# Patient Record
Sex: Male | Born: 1935 | Race: White | Hispanic: No | Marital: Single | Smoking: Never smoker
Health system: Southern US, Community
[De-identification: ages and names within clinical notes are randomized; demographics above are authoritative.]

## PROBLEM LIST (undated history)

## (undated) ENCOUNTER — Emergency Department (HOSPITAL_COMMUNITY): Admission: EM | Payer: Self-pay | Source: Home / Self Care

## (undated) DIAGNOSIS — I10 Essential (primary) hypertension: Secondary | ICD-10-CM

## (undated) DIAGNOSIS — G8929 Other chronic pain: Secondary | ICD-10-CM

## (undated) DIAGNOSIS — J45909 Unspecified asthma, uncomplicated: Secondary | ICD-10-CM

## (undated) DIAGNOSIS — I209 Angina pectoris, unspecified: Secondary | ICD-10-CM

## (undated) DIAGNOSIS — M545 Low back pain, unspecified: Secondary | ICD-10-CM

## (undated) DIAGNOSIS — I509 Heart failure, unspecified: Secondary | ICD-10-CM

## (undated) DIAGNOSIS — Z9981 Dependence on supplemental oxygen: Secondary | ICD-10-CM

## (undated) DIAGNOSIS — H349 Unspecified retinal vascular occlusion: Secondary | ICD-10-CM

## (undated) DIAGNOSIS — D649 Anemia, unspecified: Secondary | ICD-10-CM

## (undated) DIAGNOSIS — C61 Malignant neoplasm of prostate: Secondary | ICD-10-CM

## (undated) DIAGNOSIS — J42 Unspecified chronic bronchitis: Secondary | ICD-10-CM

## (undated) DIAGNOSIS — I219 Acute myocardial infarction, unspecified: Secondary | ICD-10-CM

## (undated) DIAGNOSIS — R51 Headache: Secondary | ICD-10-CM

## (undated) DIAGNOSIS — M1990 Unspecified osteoarthritis, unspecified site: Secondary | ICD-10-CM

## (undated) DIAGNOSIS — E7800 Pure hypercholesterolemia, unspecified: Secondary | ICD-10-CM

## (undated) DIAGNOSIS — K219 Gastro-esophageal reflux disease without esophagitis: Secondary | ICD-10-CM

## (undated) DIAGNOSIS — I639 Cerebral infarction, unspecified: Secondary | ICD-10-CM

## (undated) DIAGNOSIS — I2510 Atherosclerotic heart disease of native coronary artery without angina pectoris: Secondary | ICD-10-CM

## (undated) DIAGNOSIS — E119 Type 2 diabetes mellitus without complications: Secondary | ICD-10-CM

## (undated) DIAGNOSIS — J189 Pneumonia, unspecified organism: Secondary | ICD-10-CM

## (undated) DIAGNOSIS — Z8719 Personal history of other diseases of the digestive system: Secondary | ICD-10-CM

## (undated) DIAGNOSIS — R3915 Urgency of urination: Secondary | ICD-10-CM

## (undated) DIAGNOSIS — J449 Chronic obstructive pulmonary disease, unspecified: Secondary | ICD-10-CM

## (undated) DIAGNOSIS — Z8711 Personal history of peptic ulcer disease: Secondary | ICD-10-CM

## (undated) DIAGNOSIS — R011 Cardiac murmur, unspecified: Secondary | ICD-10-CM

## (undated) DIAGNOSIS — C4491 Basal cell carcinoma of skin, unspecified: Secondary | ICD-10-CM

## (undated) DIAGNOSIS — R0602 Shortness of breath: Secondary | ICD-10-CM

## (undated) DIAGNOSIS — I350 Nonrheumatic aortic (valve) stenosis: Secondary | ICD-10-CM

## (undated) DIAGNOSIS — K759 Inflammatory liver disease, unspecified: Secondary | ICD-10-CM

## (undated) HISTORY — PX: CARDIAC CATHETERIZATION: SHX172

## (undated) HISTORY — PX: CORONARY ANGIOPLASTY WITH STENT PLACEMENT: SHX49

## (undated) HISTORY — PX: INGUINAL HERNIA REPAIR: SUR1180

## (undated) HISTORY — PX: ANTERIOR FUSION CERVICAL SPINE: SUR626

## (undated) HISTORY — PX: SP PTA PERIPHERAL: HXRAD401

## (undated) HISTORY — PX: BACK SURGERY: SHX140

## (undated) HISTORY — PX: CORONARY STENT PLACEMENT: SHX1402

## (undated) HISTORY — PX: SHOULDER ARTHROSCOPY W/ ROTATOR CUFF REPAIR: SHX2400

---

## 2005-08-15 ENCOUNTER — Inpatient Hospital Stay (HOSPITAL_COMMUNITY): Admission: EM | Admit: 2005-08-15 | Discharge: 2005-08-18 | Payer: Self-pay | Admitting: Emergency Medicine

## 2005-08-15 ENCOUNTER — Ambulatory Visit: Payer: Self-pay | Admitting: Physical Medicine & Rehabilitation

## 2005-08-16 ENCOUNTER — Ambulatory Visit: Payer: Self-pay | Admitting: Internal Medicine

## 2005-08-16 ENCOUNTER — Encounter (INDEPENDENT_AMBULATORY_CARE_PROVIDER_SITE_OTHER): Payer: Self-pay | Admitting: *Deleted

## 2005-08-18 ENCOUNTER — Encounter: Payer: Self-pay | Admitting: Internal Medicine

## 2005-08-18 ENCOUNTER — Encounter (INDEPENDENT_AMBULATORY_CARE_PROVIDER_SITE_OTHER): Payer: Self-pay | Admitting: *Deleted

## 2005-08-18 ENCOUNTER — Inpatient Hospital Stay (HOSPITAL_COMMUNITY)
Admission: RE | Admit: 2005-08-18 | Discharge: 2005-08-28 | Payer: Self-pay | Admitting: Physical Medicine & Rehabilitation

## 2005-08-18 ENCOUNTER — Ambulatory Visit: Payer: Self-pay | Admitting: Internal Medicine

## 2007-11-02 ENCOUNTER — Encounter: Payer: Self-pay | Admitting: Emergency Medicine

## 2007-11-02 ENCOUNTER — Inpatient Hospital Stay (HOSPITAL_COMMUNITY): Admission: AD | Admit: 2007-11-02 | Discharge: 2007-11-03 | Payer: Self-pay | Admitting: Cardiology

## 2007-11-02 ENCOUNTER — Ambulatory Visit: Payer: Self-pay | Admitting: Cardiology

## 2008-08-30 DIAGNOSIS — H349 Unspecified retinal vascular occlusion: Secondary | ICD-10-CM

## 2008-08-30 HISTORY — DX: Unspecified retinal vascular occlusion: H34.9

## 2009-08-10 ENCOUNTER — Inpatient Hospital Stay (HOSPITAL_COMMUNITY): Admission: EM | Admit: 2009-08-10 | Discharge: 2009-08-10 | Payer: Self-pay | Admitting: Emergency Medicine

## 2010-04-27 ENCOUNTER — Observation Stay (HOSPITAL_COMMUNITY): Admission: EM | Admit: 2010-04-27 | Discharge: 2010-04-28 | Payer: Self-pay | Admitting: Emergency Medicine

## 2010-09-20 ENCOUNTER — Inpatient Hospital Stay (HOSPITAL_COMMUNITY)
Admission: EM | Admit: 2010-09-20 | Discharge: 2010-09-24 | Payer: Self-pay | Source: Home / Self Care | Attending: Internal Medicine | Admitting: Internal Medicine

## 2010-09-22 LAB — VITAMIN B12: Vitamin B-12: 295 pg/mL (ref 211–911)

## 2010-09-22 LAB — COMPREHENSIVE METABOLIC PANEL
AST: 11 U/L (ref 0–37)
BUN: 20 mg/dL (ref 6–23)
CO2: 25 mEq/L (ref 19–32)
Chloride: 96 mEq/L (ref 96–112)
Creatinine, Ser: 0.96 mg/dL (ref 0.4–1.5)
GFR calc Af Amer: >60 mL/min (ref >60)
GFR calc non Af Amer: >60 mL/min (ref >60)
Glucose, Bld: 164 mg/dL — ABNORMAL HIGH (ref 70–99)
Total Bilirubin: 0.5 mg/dL (ref 0.3–1.2)

## 2010-09-22 LAB — FOLATE: Folate: >20 ng/mL

## 2010-09-22 LAB — IRON AND TIBC
Iron: 12 ug/dL — ABNORMAL LOW (ref 42–135)
Saturation Ratios: 5 % — ABNORMAL LOW (ref 20–55)
TIBC: 259 ug/dL (ref 215–435)
UIBC: 247 ug/dL

## 2010-09-22 LAB — CBC
HCT: 26.4 % — ABNORMAL LOW (ref 39.0–52.0)
Hemoglobin: 9.7 g/dL — ABNORMAL LOW (ref 13.0–17.0)
Hemoglobin: 9.2 g/dL — ABNORMAL LOW (ref 13.0–17.0)
MCH: 31 pg (ref 26.0–34.0)
MCV: 88.9 fL (ref 78.0–100.0)
RBC: 3.16 MIL/uL — ABNORMAL LOW (ref 4.22–5.81)
RBC: 2.97 MIL/uL — ABNORMAL LOW (ref 4.22–5.81)

## 2010-09-22 LAB — DIFFERENTIAL
Basophils Absolute: 0 10*3/uL (ref 0.0–0.1)
Basophils Relative: 0 % (ref 0–1)
Monocytes Relative: 10 % (ref 3–12)
Neutro Abs: 10.6 10*3/uL — ABNORMAL HIGH (ref 1.7–7.7)
Neutrophils Relative %: 85 % — ABNORMAL HIGH (ref 43–77)

## 2010-09-22 LAB — BASIC METABOLIC PANEL
Calcium: 9.7 mg/dL (ref 8.4–10.5)
Creatinine, Ser: 1.07 mg/dL (ref 0.4–1.5)
GFR calc Af Amer: >60 mL/min (ref >60)

## 2010-09-22 LAB — CK TOTAL AND CKMB (NOT AT ARMC): CK, MB: 1.6 ng/mL (ref 0.3–4.0)

## 2010-09-22 LAB — POCT CARDIAC MARKERS
CKMB, poc: <1 ng/mL — ABNORMAL LOW (ref 1.0–8.0)
Myoglobin, poc: 51.1 ng/mL (ref 12–200)
Troponin i, poc: <0.05 ng/mL (ref 0.00–0.09)

## 2010-09-22 LAB — TSH: TSH: 0.204 u[IU]/mL — ABNORMAL LOW (ref 0.350–4.500)

## 2010-09-22 LAB — URINALYSIS, ROUTINE W REFLEX MICROSCOPIC
Ketones, ur: NEGATIVE mg/dL
Nitrite: NEGATIVE
Protein, ur: NEGATIVE mg/dL

## 2010-09-22 LAB — CARDIAC PANEL(CRET KIN+CKTOT+MB+TROPI): CK, MB: 2.3 ng/mL (ref 0.3–4.0)

## 2010-09-22 LAB — GLUCOSE, CAPILLARY: Glucose-Capillary: 248 mg/dL — ABNORMAL HIGH (ref 70–99)

## 2010-09-22 LAB — CORTISOL: Cortisol, Plasma: 36.8 ug/dL

## 2010-09-22 LAB — BRAIN NATRIURETIC PEPTIDE: Pro B Natriuretic peptide (BNP): 137 pg/mL — ABNORMAL HIGH (ref 0.0–100.0)

## 2010-09-23 LAB — CBC
Hemoglobin: 9.9 g/dL — ABNORMAL LOW (ref 13.0–17.0)
Hemoglobin: 9.8 g/dL — ABNORMAL LOW (ref 13.0–17.0)
MCH: 30.7 pg (ref 26.0–34.0)
MCH: 31.1 pg (ref 26.0–34.0)
MCV: 89.8 fL (ref 78.0–100.0)
Platelets: 217 10*3/uL (ref 150–400)
RBC: 3.22 MIL/uL — ABNORMAL LOW (ref 4.22–5.81)
RBC: 3.15 MIL/uL — ABNORMAL LOW (ref 4.22–5.81)

## 2010-09-23 LAB — CARDIAC PANEL(CRET KIN+CKTOT+MB+TROPI)
Relative Index: INVALID (ref 0.0–2.5)
Total CK: 43 U/L (ref 7–232)
Total CK: 28 U/L (ref 7–232)
Troponin I: 0.21 ng/mL — ABNORMAL HIGH (ref 0.00–0.06)

## 2010-09-23 LAB — BASIC METABOLIC PANEL
BUN: 15 mg/dL (ref 6–23)
BUN: 14 mg/dL (ref 6–23)
CO2: 26 mEq/L (ref 19–32)
Chloride: 98 mEq/L (ref 96–112)
Chloride: 98 mEq/L (ref 96–112)
Creatinine, Ser: 0.86 mg/dL (ref 0.4–1.5)
GFR calc Af Amer: >60 mL/min (ref >60)
Glucose, Bld: 123 mg/dL — ABNORMAL HIGH (ref 70–99)
Potassium: 3.9 mEq/L (ref 3.5–5.1)

## 2010-09-23 LAB — TSH: TSH: 0.832 u[IU]/mL (ref 0.350–4.500)

## 2010-09-23 LAB — GLUCOSE, CAPILLARY
Glucose-Capillary: 125 mg/dL — ABNORMAL HIGH (ref 70–99)
Glucose-Capillary: 284 mg/dL — ABNORMAL HIGH (ref 70–99)
Glucose-Capillary: 161 mg/dL — ABNORMAL HIGH (ref 70–99)
Glucose-Capillary: 262 mg/dL — ABNORMAL HIGH (ref 70–99)

## 2010-09-23 LAB — D-DIMER, QUANTITATIVE: D-Dimer, Quant: 0.68 ug/mL-FEU — ABNORMAL HIGH (ref 0.00–0.48)

## 2010-09-23 LAB — ACTH STIMULATION, 3 TIME POINTS: Cortisol, Base: 32.2 ug/dL

## 2010-09-24 LAB — GLUCOSE, CAPILLARY: Glucose-Capillary: 167 mg/dL — ABNORMAL HIGH (ref 70–99)

## 2010-09-24 NOTE — Consult Note (Addendum)
NAME:  Eugene Bauer, PERRELLI NO.:  [id number]  MEDICAL RECORD NO.:  [id number]          PATIENT TYPE:  INP  LOCATION:  3729                         FACILITY:  [facility]  PHYSICIAN:  Colleen Can. Deborah Chalk, M.D.DATE OF BIRTH:  1927/04/10  DATE OF CONSULTATION:  09/22/2010 DATE OF DISCHARGE:                                CONSULTATION   PRIMARY CARDIOLOGIST:  Located at the [facility].  The patient is unsure who is he.  PRIMARY MEDICAL DOCTOR:  Dr. Lorina Rabon, [facility].  CHIEF COMPLAINT:  Chest pain, shortness of breath, and near passing out.  HISTORY OF PRESENT ILLNESS:  Eugene Bauer as an 77 year old gentleman with a history of coronary artery disease as outlined below, CVA, hypertension, hyperlipidemia and COPD, who presented with an episode of near syncope.  On the morning of admission, he went to breakfast, but did not feel quite well.  He cannot describe what was wrong, just felt overall rough.  He sat down at the dining table and shortly into his meal, developed black vision.  He had no loss of consciousness.  He attempted to stand up and his visual problems persisted.  He denies any chest pain, shortness of breath, palpitations, nausea, or any other symptoms during this time.  He states that he was not necessarily responding to somewhat talking behind him, but things just may be because he is hard of hearing.  He tried to sit back in a chair, but could not quite coordinate himself, so had to be helped down into the seat.  EMS was called.  He has no prior history of syncope.  Of note, he also relays a history of typical consistent chest pain and shortness of breath whenever he walks to the dining hall.  It is approximately 300 feet from his resident.  He usually takes about 3 or 4 times, this is done in rest.  Between his walk period, the chest pain goes away approximately 3 minutes after resting.  Here in the hospital, he had been treated for pneumonia with the prednisone  and Avelox.  First set of cardiac enzymes were negative x2, but following set showed a troponin of 0.07 with negative CK and MB and subsequent to that with a troponin of 0.25 with an MB of 4.7 with negative CK.  He has also had some nausea and vomiting apparently that he is having other workup for.  PAST MEDICAL HISTORY: 1. Coronary artery disease.     a.     The patient states he had an MI x2, once in the 1960s and      then again in May 2011.  No cath reports are available from his MI      in May 2011.  We discussed these results with both the patient and      Pam, his daughter who is a Engineer, civil (consulting).  Apparently, the patient had 75-      80% blockage in the back of this heart, which the doctors tried to      stent, but it kept on clamping down and they tried for an hour and  half without results.  They tried a different area to angioplasty,      but due to the anatomy, could get around to the shape.  He was      started on Ranexa as an outpatient, which did help and he has now      had to return to the cardiology stent. 2. EF of 55-60% with severe aortic valve thickening, but only mild AS     with an AVA of 2.02 cm and gradient 13 mmHg.  He has mild MR. 3. CVA x2 in December 2006 and December 2010. 4. Hyperlipidemia. 5. Hypertension. 6. Diabetes. 7. GERD. 8. COPD. 9. 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 toxaphene for     __________. 10.Prostate cancer status post radiation. 11.Glaucoma. 12.PVD status post PTCA of the legs in 2007. 13.Anemia with a hemoglobin ranging 9.9 in August 2011, 12.0 in     December 2010. Hemoglobin was 9.9.  Fecal occult blood testing was     negative this admission. 14.Status post cholecystectomy. 15  Status post C-spine surgery. 1. Status post shoulder surgery.  INPATIENT MEDICATIONS: 1. Aspirin 81 mg daily. 2. Aggrenox 1 cap b.i.d. 3. Diltiazem 120 mg daily. 4. Lovenox  40 mg subcu nightly. 5. Ferrous sulfate 325 mg b.i.d. 6. NovoLog sliding scale insulin. 7. Lisinopril 5 mg nightly. 8. Lopressor 25 mg b.i.d. 9. Avelox 400 mg q.24 h. 10.Protonix 40 mg daily. 11.Nitroglycerin 0.6 mg per hour transdermally daily. 12.Prednisone 40 mg daily. 13.Ranexa 500 mg t.i.d. 14.Crestor 5 mg nightly. 15.Salmeterol 1 puff b.i.d. 16.Travatan eye drops nightly.  ALLERGIES:  IV DYE and CODEINE.  FAMILY HISTORY:  His mother had CVA.  His father had an MI.  His siblings had diabetes and heart attack.  SOCIAL HISTORY:  He lives at [place].  He is divorced and has 4 daughters.  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  He quit smoking remotely and only smoked for a short period of time and denies any alcohol use.  REVIEW OF SYSTEMS:  No fevers, chills, orthopnea, palpitations, nausea, vomiting, bright red blood per rectum, melena or hematemesis. Approximately 3 months ago, he did have melena, but this has since resolved.  Positive for occasional sock line edema.  See HPI for other pertinent positives.  All other systems were reviewed and otherwise negative.  LABORATORY DATA:  WBC 13.3, hemoglobin 9.9, hematocrit 28.9, platelet count 217.  Sodium 132, potassium 3.9, chloride 98, CO2 26, glucose 123, BUN 15, creatinine 0.16.  BNP 137.  TSH 0.204.  RADIOLOGY: 1. Chest x-ray showed worsening bilateral lower lobe infiltrates     consistent with pneumonia. 2. Abdominal ultrasound showed 3 benign kidney cysts. 3. Abdominal plain film was benign.  PHYSICAL EXAMINATION:  VITAL SIGNS:  Temperature 98.3, pulse 70, respirations 18, blood pressure 107/66 with negative orthostatics, pulse ox 93% on 1 L. GENERAL:  This is a pleasant white male, elderly, who is in no acute distress. HEENT:  Normocephalic, atraumatic with extraocular movements intact and clear sclerae.  Nares are without discharge. NECK:   Supple without carotid bruit. HEART:  Auscultation of the hear reveals regular rate and rhythm with S1 and S2 with a 2/6 systolic ejection murmur.  He has decreased breath sounds throughout without wheezes, rales or rhonchi. ABDOMEN:  Soft, nontender, nondistended with positive  bowel sounds. EXTREMITIES:  Warm, dry with trace sock line edema.  No clubbing or cyanosis.  NEUROLOGIC:  He is alert and oriented x3.  Responds to questions appropriately with a normal affect.  He is hard of hearing.  ASSESSMENT/PLAN:  The patient was seen and examined by Dr. Deborah Chalk and myself.  This is a complicated 76 year old gentleman with a prior medical history that includes cerebrovascular accident, hypertension, hyperlipidemia, diabetes, chronic obstructive pulmonary disease, and coronary artery disease.  His last catheterization was in May 2011 and then from it sounds that he had coronary artery disease, lesions that were attempted to be fixed, so we are unable to, and therefore he has been continued on medical therapy.  Dr. Deborah Chalk does not feel that the patient needs to be started on heparin at this time given his lack of chest pain at this time as well as anemia.  We would recommend no cardiac catheterization at this time and it may be more appropriate to refer the patient back to the [place] to evaluate his progress giving that they have more access to records indicating a history of coronary anatomy.  We will attempt to obtain those records, but it is often difficult from the [place].  We would continue with his current management including his medical therapy of his coronary disease in terms of aspirin, metoprolol, nitroglycerin, and Crestor.  His syncope is probably multifactorial in regards to many factors including his anemia, deconditioning, and cerebrovascular disease.  He has had no arrhythmias on telemetry thus far, only PVCs.  Continue to monitor.  He may need rehab just for improved general  strengthening.  We will also recommend to continue evaluation of his anemia.  We will continue to follow with you.  Thank you for the opportunity to participate in the care of this patient.     Dayna Dunn, P.A.C.   ______________________________ Colleen Can. Deborah Chalk, M.D.    DD/MEDQ  D:  09/22/2010  T:  09/23/2010  Job:  [id number]  cc:   [facility]  Electronically Signed by Roger Shelter M.D. on 09/24/2010 03:40:10 PM Electronically Signed by Ronie Spies  on 09/30/2010 09:30:13 PM

## 2010-09-29 ENCOUNTER — Encounter: Payer: Self-pay | Admitting: Internal Medicine

## 2010-09-29 ENCOUNTER — Inpatient Hospital Stay (HOSPITAL_COMMUNITY)
Admission: EM | Admit: 2010-09-29 | Discharge: 2010-10-02 | DRG: 178 | Disposition: A | Discharge status: Skilled Nursing Facility | Payer: Medicare Other | Attending: Internal Medicine | Admitting: Internal Medicine

## 2010-09-29 DIAGNOSIS — R4182 Altered mental status, unspecified: Secondary | ICD-10-CM | POA: Diagnosis present

## 2010-09-29 DIAGNOSIS — L89109 Pressure ulcer of unspecified part of back, unspecified stage: Secondary | ICD-10-CM | POA: Diagnosis present

## 2010-09-29 DIAGNOSIS — I252 Old myocardial infarction: Secondary | ICD-10-CM

## 2010-09-29 DIAGNOSIS — I2510 Atherosclerotic heart disease of native coronary artery without angina pectoris: Secondary | ICD-10-CM | POA: Diagnosis present

## 2010-09-29 DIAGNOSIS — E875 Hyperkalemia: Secondary | ICD-10-CM | POA: Diagnosis present

## 2010-09-29 DIAGNOSIS — R0609 Other forms of dyspnea: Secondary | ICD-10-CM | POA: Diagnosis present

## 2010-09-29 DIAGNOSIS — N179 Acute kidney failure, unspecified: Secondary | ICD-10-CM | POA: Diagnosis present

## 2010-09-29 DIAGNOSIS — M5030 Other cervical disc degeneration, unspecified cervical region: Secondary | ICD-10-CM | POA: Diagnosis present

## 2010-09-29 DIAGNOSIS — L8991 Pressure ulcer of unspecified site, stage 1: Secondary | ICD-10-CM | POA: Diagnosis present

## 2010-09-29 DIAGNOSIS — Z8546 Personal history of malignant neoplasm of prostate: Secondary | ICD-10-CM

## 2010-09-29 DIAGNOSIS — R5381 Other malaise: Secondary | ICD-10-CM | POA: Diagnosis present

## 2010-09-29 DIAGNOSIS — J690 Pneumonitis due to inhalation of food and vomit: Principal | ICD-10-CM | POA: Diagnosis present

## 2010-09-29 DIAGNOSIS — R52 Pain, unspecified: Secondary | ICD-10-CM

## 2010-09-29 DIAGNOSIS — R0989 Other specified symptoms and signs involving the circulatory and respiratory systems: Secondary | ICD-10-CM | POA: Diagnosis present

## 2010-09-29 DIAGNOSIS — E119 Type 2 diabetes mellitus without complications: Secondary | ICD-10-CM | POA: Diagnosis present

## 2010-09-29 DIAGNOSIS — T50995A Adverse effect of other drugs, medicaments and biological substances, initial encounter: Secondary | ICD-10-CM | POA: Diagnosis present

## 2010-09-29 DIAGNOSIS — I739 Peripheral vascular disease, unspecified: Secondary | ICD-10-CM | POA: Diagnosis present

## 2010-09-29 DIAGNOSIS — J4489 Other specified chronic obstructive pulmonary disease: Secondary | ICD-10-CM | POA: Diagnosis present

## 2010-09-29 DIAGNOSIS — D649 Anemia, unspecified: Secondary | ICD-10-CM | POA: Diagnosis present

## 2010-09-29 DIAGNOSIS — H409 Unspecified glaucoma: Secondary | ICD-10-CM | POA: Diagnosis present

## 2010-09-29 DIAGNOSIS — K219 Gastro-esophageal reflux disease without esophagitis: Secondary | ICD-10-CM | POA: Diagnosis present

## 2010-09-29 DIAGNOSIS — J449 Chronic obstructive pulmonary disease, unspecified: Secondary | ICD-10-CM | POA: Diagnosis present

## 2010-09-29 DIAGNOSIS — D72829 Elevated white blood cell count, unspecified: Secondary | ICD-10-CM | POA: Diagnosis present

## 2010-09-29 DIAGNOSIS — Z8673 Personal history of transient ischemic attack (TIA), and cerebral infarction without residual deficits: Secondary | ICD-10-CM

## 2010-09-29 DIAGNOSIS — Z9981 Dependence on supplemental oxygen: Secondary | ICD-10-CM

## 2010-09-29 DIAGNOSIS — I359 Nonrheumatic aortic valve disorder, unspecified: Secondary | ICD-10-CM | POA: Diagnosis present

## 2010-09-29 DIAGNOSIS — F17200 Nicotine dependence, unspecified, uncomplicated: Secondary | ICD-10-CM | POA: Diagnosis present

## 2010-09-29 DIAGNOSIS — Z66 Do not resuscitate: Secondary | ICD-10-CM | POA: Diagnosis present

## 2010-09-29 LAB — COMPREHENSIVE METABOLIC PANEL
ALT: 33 U/L (ref 0–53)
AST: 18 U/L (ref 0–37)
Alkaline Phosphatase: 57 U/L (ref 39–117)
GFR calc Af Amer: 51 mL/min — ABNORMAL LOW (ref >60)
Glucose, Bld: 201 mg/dL — ABNORMAL HIGH (ref 70–99)
Potassium: 5.8 mEq/L — ABNORMAL HIGH (ref 3.5–5.1)
Sodium: 130 mEq/L — ABNORMAL LOW (ref 135–145)
Total Protein: 6 g/dL (ref 6.0–8.3)

## 2010-09-29 LAB — DIFFERENTIAL
Basophils Absolute: 0 10*3/uL (ref 0.0–0.1)
Lymphocytes Relative: 5 % — ABNORMAL LOW (ref 12–46)
Neutro Abs: 13.2 10*3/uL — ABNORMAL HIGH (ref 1.7–7.7)

## 2010-09-29 LAB — URINALYSIS, ROUTINE W REFLEX MICROSCOPIC
Nitrite: NEGATIVE
Specific Gravity, Urine: 1.02 (ref 1.005–1.030)
pH: 5 (ref 5.0–8.0)

## 2010-09-29 LAB — CBC
HCT: 27.1 % — ABNORMAL LOW (ref 39.0–52.0)
Hemoglobin: 9.1 g/dL — ABNORMAL LOW (ref 13.0–17.0)
RDW: 13.5 % (ref 11.5–15.5)
WBC: 14.6 10*3/uL — ABNORMAL HIGH (ref 4.0–10.5)

## 2010-09-29 LAB — GLUCOSE, CAPILLARY

## 2010-09-30 ENCOUNTER — Inpatient Hospital Stay (HOSPITAL_COMMUNITY): Payer: Medicare Other

## 2010-09-30 DIAGNOSIS — R4182 Altered mental status, unspecified: Secondary | ICD-10-CM

## 2010-09-30 LAB — CBC
Hemoglobin: 9.4 g/dL — ABNORMAL LOW (ref 13.0–17.0)
MCH: 31.2 pg (ref 26.0–34.0)
MCHC: 34.4 g/dL (ref 30.0–36.0)

## 2010-09-30 LAB — POCT I-STAT 3, ART BLOOD GAS (G3+)
Acid-Base Excess: 4 mmol/L — ABNORMAL HIGH (ref 0.0–2.0)
O2 Saturation: 99 %
Patient temperature: 98.7
TCO2: 32 mmol/L (ref 0–100)
pH, Arterial: 7.385 (ref 7.350–7.450)

## 2010-09-30 LAB — BASIC METABOLIC PANEL
BUN: 45 mg/dL — ABNORMAL HIGH (ref 6–23)
CO2: 26 mEq/L (ref 19–32)
CO2: 26 mEq/L (ref 19–32)
Calcium: 10 mg/dL (ref 8.4–10.5)
Calcium: 9.7 mg/dL (ref 8.4–10.5)
Chloride: 95 mEq/L — ABNORMAL LOW (ref 96–112)
Creatinine, Ser: 1.31 mg/dL (ref 0.4–1.5)
Creatinine, Ser: 1.45 mg/dL (ref 0.4–1.5)
GFR calc Af Amer: 57 mL/min — ABNORMAL LOW (ref >60)
GFR calc Af Amer: >60 mL/min (ref >60)
Glucose, Bld: 259 mg/dL — ABNORMAL HIGH (ref 70–99)

## 2010-09-30 LAB — IRON AND TIBC
Iron: 45 ug/dL (ref 42–135)
TIBC: 208 ug/dL — ABNORMAL LOW (ref 215–435)
UIBC: 163 ug/dL

## 2010-09-30 LAB — GLUCOSE, CAPILLARY
Glucose-Capillary: 135 mg/dL — ABNORMAL HIGH (ref 70–99)
Glucose-Capillary: 112 mg/dL — ABNORMAL HIGH (ref 70–99)
Glucose-Capillary: 155 mg/dL — ABNORMAL HIGH (ref 70–99)
Glucose-Capillary: 157 mg/dL — ABNORMAL HIGH (ref 70–99)

## 2010-09-30 LAB — VITAMIN B12: Vitamin B-12: 371 pg/mL (ref 211–911)

## 2010-09-30 LAB — LIPID PANEL
LDL Cholesterol: 93 mg/dL (ref 0–99)
VLDL: 39 mg/dL (ref 0–40)

## 2010-10-01 ENCOUNTER — Encounter: Payer: Self-pay | Admitting: Ophthalmology

## 2010-10-01 LAB — BASIC METABOLIC PANEL
BUN: 19 mg/dL (ref 6–23)
Chloride: 96 mEq/L (ref 96–112)
Glucose, Bld: 154 mg/dL — ABNORMAL HIGH (ref 70–99)
Potassium: 4.3 mEq/L (ref 3.5–5.1)
Sodium: 135 mEq/L (ref 135–145)

## 2010-10-01 LAB — URINE CULTURE

## 2010-10-01 LAB — GLUCOSE, CAPILLARY: Glucose-Capillary: 182 mg/dL — ABNORMAL HIGH (ref 70–99)

## 2010-10-01 LAB — CBC
HCT: 27.4 % — ABNORMAL LOW (ref 39.0–52.0)
Hemoglobin: 9.2 g/dL — ABNORMAL LOW (ref 13.0–17.0)
MCV: 89.3 fL (ref 78.0–100.0)
RBC: 3.07 MIL/uL — ABNORMAL LOW (ref 4.22–5.81)
WBC: 13.6 10*3/uL — ABNORMAL HIGH (ref 4.0–10.5)

## 2010-10-01 LAB — HEMOGLOBIN A1C: Hgb A1c MFr Bld: 6.8 % — ABNORMAL HIGH (ref <5.7)

## 2010-10-01 NOTE — Discharge Summary (Signed)
NAME:  Eugene Bauer, Eugene Bauer NO.:  [id number]  MEDICAL RECORD NO.:  [id number]          PATIENT TYPE:  INP  LOCATION:  3729                         FACILITY:  [facility]  PHYSICIAN:  Mariea Stable, MD   DATE OF BIRTH:  [birth year]  DATE OF ADMISSION:  09/20/2010 DATE OF DISCHARGE:  09/24/2010                              DISCHARGE SUMMARY   DISCHARGE DIAGNOSES: 1. Presyncope. 2. Shortness of breath. 3. Non-ST-elevation myocardial infarction. 4. Question of ascites. 5. Hyponatremia. 6. Anemia. 7. Type 2 diabetes. 8. Glaucoma. 9. History of cerebrovascular accident x2. 10.Hypercholesterolemia. 11.Hypertension. 12.Gastroesophageal reflux disease.  DISCHARGE MEDICATIONS: 1. Mometasone 200 mcg inhaler, inhale 2 puffs at bedtime. 2. Terazosin 5 mg tabs, take 1 tab b.i.d. 3. Losartan 25 mg tabs, take 1 tab daily. 4. Gabapentin 300 mg tabs, take 1 tab daily. 5. Metformin 500 mg tabs, take 1 tab twice daily. 6. Diltiazem 120 mg by mouth take 1 capsule daily. 7. Ranolazine 500 mg tabs, take 1 tab by mouth twice daily. 8. Flexeril 10 mg tabs, take 1 tab 3 times daily as needed. 9. Crestor 20 mg tabs, take 1 tab by mouth at night. 10.Oxycodone 5 mg tabs, take one tabs every 4 hours as needed for     pain. 11.Aspirin 81 mg tabs, take 1 tab by mouth daily. 12.Travatan ophthalmic 0.004% one drop in each eye at night. 13.Omeprazole 40 mg tabs, take 1 tab by mouth twice daily. 14.Calcium carbonate take 1 tab by mouth daily. 15. Spiriva handihaler 11mcg/cap 1cap inhaled daily.  16.Vitamin C 500 mg tabs, take 1 tab daily. 17.Vitamin D3 1000 units take 1 tab by mouth daily. 18.Prednisone 10 mg tabs take 3 tab by mouth for 3 days, 2 tabs by     mouth daily for 3 days, and 1 tab by mouth daily for 3 days and     then stop. 19.Benzonatate 100 mg capsules take 2 tabs every 8 hours as needed for     cough. 20.Metoprolol 50 mg tabs, take 1 tab twice daily. 21.Ferrous sulfate 325  mg tabs, take 1 tab b.i.d. 22.Nitroglycerin 0.6 mg/hr patch use 1 patch daily. 23.Plavix 75 mg tabs, take 1 tab daily. 24.Metoclopramide 10 mg tabs, take 1 tab at bedtime. 25.Avelox 400 mg tabs take 1 tab daily for 3 days. 26.Tussionex suspension take 2.5 mL every 6 hours as needed for cough. 27. Home O2 continouous at 2L by [place].  DISPOSITION AND FOLLOWUP:  The patient is to follow up with the [facility] at the [facility] on October 26, 2010, at 8:30 a.m.  At this visit, the patient's K should be discussed with Dr. Deneen Harts, the patient's PCP.  It is important that the patient's angina continued to be reassessed and a modifications made in the patient's regimen as needed.  The patient's blood pressure should also continued to be followed and any adjustments made appropriately.  The patients did have both an anemia and hyponatremia to be discussed further later, thus a CBC and a BMET should be checked. The pients need for chronic O2 use should also be readressed at this  visit, and PFT's  ordered if no recent availabe.   The patient will follow up in the Outpatient Clinic at [facility] in 2 weeks, and at that visit, the patient's cough should be reassessed.  We will also check a BMET and a CBC at that time to ensure that the patient's hemoglobin and sodium have stabilized.  It is important that we rechecked blood pressure medication at that time as we did increase the patient's metoprolol during the hospitalization and may need to adjust his other blood pressure medications accordingly.  PROCEDURES PERFORMED:  A one-view portable chest x-ray was performed on September 20, 2010, which demonstrated cardiomegaly with mild vascular congestion and bibasilar atelectasis and acute abdomen was performed on September 20, 2010, which demonstrated a benign appearing abdomen and chest.  Complete ultrasound of the abdomen was performed, which demonstrated no acute or significant findings except for 3  benign-appearing cyst on the right kidney.  Two-view chest x-ray was performed on September 21, 2010, which demonstrated worsening bilateral lower lobe infiltrates consistent with pneumonia, a gastric emptying study was performed on September 22, 2010, which demonstrated delayed gastric emptying with 72% retaining at 1 hour and 62% retaining at 2 hours.  Normal is less than 30 at 2 hours.  A chest x-ray was performed on September 23, 2010, which demonstrated persistent bibasilar infiltrates with increase in right lower lobe since prior study.  A V/Q scan was performed on September 23, 2010, which demonstrated a very low probability for pulmonary embolism.  A 2-D echocardiogram was performed on September 21, 2010, which demonstrated systolic function that was normal with an EF of 55-60% with severe diffuse thickening and calcification of the aortic valve and a moderately calcified annulus of the mitral valve.  There was also moderate dilation of the left atrium.  CONSULTATIONS:  Cardiology.  BRIEF ADMITTING HISTORY AND PHYSICAL:  This is an 77 year old male with past medical history significant for myocardial infarction x2 as well as CVA x2, hypertension, and diabetes who presented from this facility because of near syncope.  The patient's history was primarily taking by his daughter who is the power of attorney because of question of dementia.  Apparently, the patient was not feeling well on the morning of his admission and had increased fatigue with mild nausea, but without vomiting.  The patient had some breakfast as usual in the morning, after completing his breakfast, he felt dizzy, slurred his speech, somewhat slumped over in his chair.  The patient did not lose any consciousness and denied having bit his tongue or shaking or loss of bowel or bladder control.  The patient denied any chest pain, palpitations, or other neurologic symptoms at that time, and had gradual improvement of the  dizziness thereafter.  The patients then was picked up by EMS and brought to the ED.  Of note, he was recently diagnosed with acute bronchitis and this had increased shortness of breath over the last 2 months.  The patient is currently on a course of steroids and states that he primarily gets short of breath with exertion, but also occasionally with speech.  The patient denies any fevers, chills, or sick contacts, but does state that he has had about 3-4 month ago, he noted black stools, which have resolved.  He has had no recent constipation or diarrhea.  The daughter also states that his abdomen is chronically distended, but it has been worsening recently.  ALLERGIES:  IVP DYE.  PAST MEDICAL HISTORY:  Glaucoma, prostate cancer, prostate hypertrophy, COPD/chronic  bronchitis, MI x2 last being in May 2010.  CVA x2, one in November 2006 and other in November 2010 without significant deficits, hypercholesterolemia, hypertension, type 2 diabetes, GERD, and question of dementia.  PHYSICAL EXAMINATION:  VITAL SIGNS:  On admission temperature 99.0, pulse 74, blood pressure 115/42, respiratory rate 18, and O2 sat 94% on room air. GENERAL:  Alert, well-developed, and cooperative through examination. HEAD:  Normocephalic, atraumatic. EYES:  Vision grossly intact.  Pupils are equal, round, and reactive to light. MOUTH:  Oropharynx is pink and moist.  No erythema or exudates. NECK:  Supple.  Full range of motion.  No thyromegaly, JVD, or carotid bruits. LUNGS:  Normal respiratory effort, mild expiratory wheezes.  No crackles. HEART:  Regular rate and rhythm, 3/6 systolic murmur.  No gallops or rubs. ABDOMEN:  Soft, very mild diffuse tenderness.  Normal bowel sounds. Moderate cyst. MUSCULOSKELETAL:  No joint swelling or reddening. EXTREMITIES:  No cyanosis, clubbing, or edema. NEUROLOGIC:  Alert and oriented x3.  Cranial nerves II through XII intact.  LABORATORY DATA: 1. On admission  white blood cell count 12.5, hemoglobin 9.7,     hematocrit 28.1, and platelet count 189. 2. Cardiac enzymes negative x1. 3. BMET; sodium 127, potassium 4.2, chloride 92, bicarb 27, glucose     207, BUN 17, and creatinine 1.07. 4. BNP 137. 5. UA was negative.  HOSPITAL COURSE BY PROBLEM: 1. Presyncope:  This was likely to be multifactorial and the patient     proved to not be orthostatic while here in the hospital.     Differential diagnosis include orthostatic hypotension, arrhythmia,     PE, and hypoglycemia.  The patient's blood sugars remained stable     throughout his hospitalization and his CBG was actually slightly     elevated to around 200 on admission.  A PE was ruled out with V/Q     scan.  As mentioned previously, orthostatic vital     signs remained negative, but the patient had received IV fluids in     the emergency room prior to orthostatic vital signs being taking.     Arrhythmia was on the differential however, the patient was     admitted to our telemetry unit and did not demonstrate any     arrhythmias nor did he have prolonged QT interval.  An EKG on     admission demonstrated normal sinus rhythm without heart block.  We     did cycle his cardiac enzymes, which were initially negative x3.     The patient on discharge was instructed on the importance of     standing slowly and allowing his body time to equilibrate before     walking around the room.  He was also instructed not to drive, though     he does not currently drive, given his episode of presyncope. The patient continued     to have an O2 sat of 88% on RA so it is possible that the pts presyncope      could be the result of hypoxia due to his COPD.  The patient was discharged     on home O2.  2. Shortness of breath:  This is also likely multifactorial.  The     patient has a history of chronic bronchitis and was recently     diagnosed with acute bronchitis and has had 2 months of dry cough.     The  patient has been on prednisone during that time frame and  we     increased his prednisone to 40 mg daily and restarted him on a     prednisone taper.  The patient was also started on q.4 h. albuterol     nebulizers with continued O2 by nasal cannula.  Although, we     initially thought this might be CHF given the patient's mild     abdominal distention, the patient's BNP was only 137.  Last echo in     2006 demonstrated an EF of 60-65%.  Because of the patient's     shortness of breath and chest pain, which will be mentioned later     as well as a slight tachycardia even though he was on beta-     blockers, a D-dimer was performed as the patient is with low     probability for pulmonary embolism.  Because D-dimer was positive,     a V/Q scan was performed, which came back as a very low probability     for pulmonary embolism.  Given the patient's shortness of breath, a     chest x-ray was initially performed, which showed bibasilar     atelectasis, but with continued shortness of breath, repeat chest x-     ray demonstrated concern of a right lower lobe pneumonia.  Avelox     was started and the patient was treated with a 5-day course.     Although the patient's right lower lobe appeared slightly more     consolidated on discharge, this was felt to be secondary to IV     fluid administration and his clinical condition was improving.  On     the day of discharge, the patients SOB was improved but he continued to      desaturate to 88% on RA.  Pt was discharged on home O2 and the need for this     chronically should be readressed by his PCP. Given his CP and known CAD we have      placed the pt on spireva and asmanexfor his SOB.  3. NSTEMI:  The patient had significant chest pain on the day after     his admission.  This was felt to be secondary to mild tachycardia     and albuterol use.  Cardiac enzymes gradually increased with     troponin increasing up to 0.25, these then trended down.      Cardiology was consulted, but recommended conservative management     with continued treatment with the patient's Ranexa, statin, and     nitroglycerin.  The patient was on Aggrenox on admission for his     strokes, but given his significant cardiac history, we did change     Aggrenox to Plavix and we would recommend continuing this in the     future for both his secondary CVA and MI prevention.  The patient's     EKG remained stable and unchanged from his baseline throughout his     hospitalization. 4. Question of ascites:  The patient has relatively significant     abdominal distention much of which is likely secondary to a ventral     hernia.  The patient's daughter feel that this was worse than     typical and initially, the history was confusing and there was     question of whether or not he had been vomiting for multiple days     as such a bowel obstruction was of concern and a acute GI series  did not demonstrate any such obstruction.  An ultrasound was     performed to evaluate for question of ascites, but was negative for     any acute process as well.  The patient did have no organomegaly on     exam and we did not change anything about his medications and just     recommended continued monitoring at this point in time. 5. Anemia:  This appears to be multifactorial and anemia panel was     performed, which demonstrated a slightly low iron at 12, a TIBC of     5, a ferritin of 48, vitamin B12 of 295, and a folate of 20.     Because the patient's vitamin B12 was on the lower side, we did     order a methylmalonic acid, which was still pending at the time of     discharge.  Because the ferritin was slightly on the lower side as     well we did start iron at this time, the patient did report black     stools 3-4 months ago, which have resolved.  As such, we did heme     check his stool and it was found to be negative.  The patient     denies any bright red blood per rectum.   The patient's CBC should     continue to be followed to ensure that he is not worsening, but it     has remained stable while he is here. 6. Hyponatremia:  The etiology of this is unclear at this time, but     the patient had proper adrenal function and we did check a     TSH to ensure that the patient is not hypothyroid.  The TSH     initially came back slightly below normal, but repeat TSH and T4     were found to be normal during this hospitalization.  The patient     is euvolemic and I do not feel that he has SIADH.  The patient's     sodium also corrected somewhat with IV fluid administration.  I     would recommend continued monitoring of this and no further     interventions were performed at this time. 7.  Hypertension: The pt was discharged on an increased dose of metoprolol (100mg  daily), and we      decreased his losartan to 25 mg daily. We will contiue the pts terazosin for his BPH as      pts BP is elevated enought to handle it.  Pts bp should continued to be monitored as an outpt     and appropriate titration made as needed.   DISCHARGE VITAL SIGNS:  On the day of discharge, the patient's temperature was 98.1, his blood pressure was 154/68, pulse 85, respirations 18, and satting at 91% on 2 L.     Sinda Du, MD   ______________________________ Mariea Stable, MD    BB/MEDQ  D:  09/24/2010  T:  09/24/2010  Job:  [id number]  cc:   [facility] Dr. Deneen Harts  Electronically Signed by Sinda Du MD on 09/28/2010 08:52:59 AM Electronically Signed by Mariea Stable MD on 10/01/2010 02:21:27 PM

## 2010-10-02 DIAGNOSIS — R4182 Altered mental status, unspecified: Secondary | ICD-10-CM

## 2010-10-02 LAB — GLUCOSE, CAPILLARY
Glucose-Capillary: 130 mg/dL — ABNORMAL HIGH (ref 70–99)
Glucose-Capillary: 165 mg/dL — ABNORMAL HIGH (ref 70–99)

## 2010-10-06 LAB — CULTURE, BLOOD (ROUTINE X 2)
Culture  Setup Time: 201202010504
Culture  Setup Time: 201202010504
Culture: NO GROWTH

## 2010-10-07 NOTE — Assessment & Plan Note (Signed)
Summary: Hospital Admission  INTERNAL MEDICINE ADMISSION HISTORY AND PHYSICAL ***Place in front of progress notes section of chart***  Attending: Dr. Josem Kaufmann 1st conact: Dr. Claudette Laws [phone] 2nd contact: Dr. Gilford Rile [phone]  Weekends, [place], After 5pm Weekdays: 1st contact: [phone] 2nd contact: [phone]   PCP: unassigned  CC: altered mental status, increased LE swelling  HPI: Pt is an 77 y/o M with extensive PMH outlined below who presents as a transer to [facility] ER from his ALF for reports of increased lethargy, confusion, and AMS.  Two of his daughters are present with him and provide the majority of the hx.  Daughter state that the pt was confused upon waking this am; he confused his grandson with a fellow ALF resident, became confused about the year, and was much more tired than usual.  These symptoms have since improved but daughter state pt is not completely back to his baseline.   Pt was recently d/cd from [facility] on 09/24/10 follwing tx for SOB (multifactorial - pt tx for PNA as well as COPD exac), STEMI, and nausea/vomiting (2/2 gastroparesis).  Daughter note pt has continued to cough although is unable to produce any sputum.  No fever, rigors, syncope, complaints of c/p reported by daughters or by ALF staff.  They note that he took his O2 off this am with subsequent drop in his O2 sat to the 80s; this eventually improved after pt was placed back on his nasal cannula.  Pt sustained a fall to his left side 3 days PTA but only sustained minor trauma to his right arm.  Daughter also report pt has not been eating well and has also developed significant LE edema that is very unusual for the pt.    Pt denies SOB, c/p, sweats, fevers, chills, syncope, doe, abdominal pain, nausea, vomiting, diarrhea, headache, vision changes, weakness, tingling/numbness, dysuria, BRBPR, dark black stool or other complaint.  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 Mometasone 200 mcg inhaler, inhale 2 puffs at bedtime.   2. Terazosin 5 mg tabs, take 1 tab b.i.d.   3. Losartan 50 mg tabs, take 1 tab daily.   4. Gabapentin 300 mg tabs, take 1 tab daily.   5. Metformin 500 mg tabs, take 1 tab twice daily.   6. Diltiazem 120 mg by mouth take 1 capsule daily.   7. Ranolazine 500 mg tabs, take 1 tab by mouth twice daily.   8. Flexeril 10 mg tabs, take 1 tab 3 times daily as needed.   9. Crestor 20 mg tabs, take 1 tab by mouth at night.   10.Oxycodone 5 mg tabs, take one tabs every 4 hours as needed for       pain.   11.Aspirin 81 mg tabs, take 1 tab by  mouth daily.   12.Travatan ophthalmic 0.004% one drop in each eye at night.   13.Omeprazole 40 mg tabs, take 1 tab by mouth twice daily.   14.Calcium carbonate take 1 tab by mouth daily.   15.Ipratropium/albuterol inhaler inhale 2 puffs every q.6 h. as needed       for shortness of breath.   16.Vitamin C 500 mg tabs, take 1 tab daily.   17.Vitamin D3 1000 units take 1 tab by mouth daily.   18.Prednisone 10 mg tabs take 3 tab by mouth for 3 days, 2 tabs by       mouth daily for 3 days, and 1 tab by mouth daily for 3 days and       then stop.   19.Benzonatate 100 mg capsules take 2 tabs every 8 hours as needed for       cough.   20.Metoprolol 25 mg tabs, take 1 tab twice daily.   21.Ferrous sulfate 325 mg tabs, take 1 tab b.i.d.   22.Nitroglycerin  0.6 mg/hr patch use 1 patch daily.   23.Plavix 75 mg tabs, take 1 tab daily.   24.Metoclopramide 10 mg tabs, take 1 tab at bedtime.   25.Avelox 400 mg tabs take 1 tab daily.   26.Tussionex suspension take 2.5 mL every 6 hours as needed for cough.     SOCIAL HISTORY: Divorced. 5 children all health. POA is Daylene Katayama [phone] (daughter who is a Engineer, civil (consulting)), primary care is [facility], Smoker intermittently for 20 years, quit 30 years ago. No ETOH abuse.  FAMILY HISTORY Mother deceased at 48 CHF/MI. Father deceased at 1 with AMI.  ROS: as per HPI, all other systems reviewed and negative   VITALS: T: 98.6  P: 68  BP: 123/47  R: 14  O2SAT: 97% ON: 4L [place]   PHYSICAL EXAM: General:  alert, NAD, cooperative, A&Ox3 Head:  normocephalic and atraumatic.   Eyes:  PERRLA, EOMI, vision grossly intact, conjuctive and sclerae within normal limits.   Mouth:  MM dry, no erythema, no exudates, or lesions.   Neck:  supple, full ROM, trachea midline, no palp masses, no JVD   Lungs:  crackles noted in RLL, decreased BS throughout bilateral fields, no wheezes/ronchi, good air movement, normal respiratory effort  Heart: RRR, 4/6 SM loudest at R and L 2nd ICS.  Clear S1, S2. Abdomen:  tense, distended with increased tympany, non-tender, BS present and normoactive, no palpable masses  Msk:  no joint swelling, warmth, or erythema  Neurologic:  CN II-XII intact, able to follow commands, +4 strength in LUE and LLE, +5 grip strength bilaterally, +5 strength LUE/LLE,  sensation grossly intact,  Skin: apporox 10cm area of erythema and skin breakdown surrounding sacrum.  No draining lesions noted. Ext: +2 pitting edema present in bilateral LEs extending to mid-shin. Psych: memory intact for recent and remote, normally interactive, good eye contact, affect as expected   LABS  Sodium (NA)                              130        l      135-145          mEq/L  Potassium (K)                            5.8        h  3.5-5.1          mEq/L  Chloride                                 96                96-112           mEq/L  CO2                                      24                19-32            mEq/L  Glucose                                  201        h      70-99            mg/dL  BUN                                      55         h      6-23             mg/dL  Creatinine                               1.58       h      0.4-1.5          mg/dL  GFR, Est Non African American            42         l      >60              mL/min  GFR, Est African American                51         l      >60              mL/min    Oversized comment, see footnote  1  Bilirubin, Total                         0.4               0.3-1.2          mg/dL  Alkaline Phosphatase                     57                39-117           U/L  SGOT (AST)                               18                0-37             U/L  SGPT (ALT)                               33                0-53             U/L  Total  Protein                           6.0               6.0-8.3          g/dL  Albumin-Blood                            2.7        l      3.5-5.2          g/dL  Calcium                                  9.7               8.4-10.5         mg/dL   WBC                                      14.6       h      4.0-10.5         K/uL  RBC                                      2.99       l      4.22-5.81        MIL/uL  Hemoglobin (HGB)                         9.1        l      13.0-17.0        g/dL  Hematocrit (HCT)                         27.1       l      39.0-52.0        %  MCV                                      90.6              78.0-100.0       fL  MCH -                                    30.4              26.0-34.0        pg  MCHC  33.6              30.0-36.0        g/dL  RDW                                      13.5              11.5-15.5        %  Platelet Count (PLT)                     218                150-400          K/uL  Neutrophils, %                           91         h      43-77            %  Lymphocytes, %                           5          l      12-46            %  Monocytes, %                             4                 3-12             %  Eosinophils, %                           0                 0-5              %  Basophils, %                             0                 0-1              %  Neutrophils, Absolute                    13.2       h      1.7-7.7          K/uL  Lymphocytes, Absolute                    0.7               0.7-4.0          K/uL  Monocytes, Absolute                      0.6               0.1-1.0          K/uL  Eosinophils, Absolute  0.0               0.0-0.7          K/uL  Basophils, Absolute                      0.0               0.0-0.1          K/uL  Color, Urine                             YELLOW            YELLOW  Appearance                               CLEAR             CLEAR  Specific Gravity                         1.020             1.005-1.030  pH                                       5.0               5.0-8.0  Urine Glucose                            NEGATIVE          NEG              mg/dL  Bilirubin                                NEGATIVE          NEG  Ketones                                  NEGATIVE          NEG              mg/dL  Blood                                    NEGATIVE          NEG  Protein                                  NEGATIVE          NEG              mg/dL  Urobilinogen                             1.0               0.0-1.0          mg/dL  Nitrite  NEGATIVE          NEG  Leukocytes                               NEGATIVE          NEG  CT head without contrast:  1.  No acute intracranial abnormalities.  2.  Small vessel ischemic disease and brain atrophy. 3.  Stable right frontal convexity encephalomalacia.  CXR 2 view:  Improving bibasilar airspace  opacities   ASSESSMENT AND PLAN: 1. AMS: Patient has baseline dementia, current status is likely a superimposed delirium given intermittent nature of symptoms-multifactorial, also recently started on Tussionex. Differential includes delerium, CVA, hypoperfusion, hyponatremia, medication effects, or underlying infection (ie. persistent PNA); meningitis seems very unlikey given pts presentation.  CT head was negative for any intracranial process (SDH, SAH); CT may not indicate evidence of acute CVA however this seems less likely as pt is without new/changed neuro findings on exam.   - will hold all narcotic medications as well as gabapentin and flexeril              -w/u underlying cause as outlined below  2. Hyponatremia Etiology is current unclear, but 130 is unchaned from his baseline on his most recent admission. His volume status is difficult to assess, his last 2D echo shows a preserved EF, but he has peripheral edema, possibly due to low albumin state. His mucous membranes look dry and he has evidence of acute renal failure. Suspect heart failure as underlying cause but he has no diuretics on his med list. Doubt SIADH given BUN, but will confirm with urine osm.   - will start with gentle hydration with NS   -check serum osm, urine osm, urine Na  3. Bibasilar Infiltrates Questionable PNA; he is not febrile but has leukocytosis (although this may also be the result of recent steroid taper) and mild hypotension.  He was treated empirically with Avelox for 5 days during his last admit, however it is not clear that he completed a full course of abx on d/c home.  Pt has a hx of tocbacco use as well as exposure to toxaphene and other chemicals while he was working; his chronic cough and findings on CXR may represent malignancy.   -will continue Avelox for now    -will check blood cultures   -may need more extensive evaluation of lungs (possible CT chest once renal function is improved), will discuss  with team in am   4. Chronic Obstructive Lung Disease/Hypoxia O2 dependent, hypoxemic on room air, unclear if this is his baseline-reported and witnessed O2 sat of 82% in ED  on RA.Marland Kitchen He was discharged on a steroid taper 09/23/10 and continued on LABA-inhaled steroid combo. He is not wheezing currently. Will hold steroids for now. His hypoxia may be the result of CHF (aortic stenosis in setting of preserved EF) although he has preserved EF, he also has AS), PNA, COPD, or PE.  Pt had V/Q scan on 09/23/10 for c/o SOB and chest pain: very low prob for PE but peripheral irregulality perfusion noted in right lateral lung that "matched ventilatory findings."   -check ABG, BNP   strict I/Os, daily weight   -titrate o2 as tolerated   - will repeat interval CXR   -Albuterol and Atrovent nebs      -continue Avelox      - will discuss V/Q findings with team in am  as well as need to repeat study to eval for possible PE  5. Anemia Baseline is around 9.5-10.0 now 9.1 and appears dehydrated, previous eval showed Fe deficiency.   -repeat anemia panel   - CBC in am   -guiac stools  6, Abdominal distension Decreased appetite, likely ascites from CHF, low albumin.  Pt without any peritoneal signs on exam.   -will check Acute Abdominal Series to eval for possible ileus/SBO   - if Hb and BP drop will need to assess for retroperitoneal bleed  7. Acute Renal Failure Had recent renal US with no evidence of hydro on 09/20/10. Urinary retension concern with hx of prostate ca. UA is negative.   -hydrate and repeat labs   -check FeNa   - Strict I/Os 8. Diabetes Mellitus Type 2 Stable   -SSI, Lantus, hold oral meds, check A1C  9. Coronary Artery Disease-severe Significant CAD history with NSTEMI 08/2010, NSTEMI 12/2009 [facility], non-stentable 80%LAD). being managed medically with ACE-I, BB and Plavix.   -will hold ACE-I in setting of ARF and hyperkalemia   -hold Diltiazem   -recently started on ranolazine-will  hold for now   -continue metoprolol and plavix  10. Aortic stenosis, high grade calcification but mild stenosis on Echo gradient 2010 Possibly symptomatic, EF preserved, probably not a candidate for repair or replacement   -will monitor on tele  11. Hyperkalemia On ACE-I with acute renal failure.   -will repeat labs to verify (increased K could be the result of hemolysis)  12. Decreased mobilty/sacral decubitus ulcer   -OOB and PT for mobility, work on nutritional status   -DRG per WC nursing/Mepilex/Barrier  13. VTE prophylaxis:   -Lovenox, renally adjusted per pharm  14. Code Status:   -DNR/DNI   -Consider palliative care consult to establish GOC

## 2010-10-17 NOTE — Discharge Summary (Signed)
NAME:  Eugene Bauer, AKEN NO.:  [id number]  MEDICAL RECORD NO.:  [id number]           PATIENT TYPE:  I  LOCATION:  4702                         FACILITY:  [facility]  PHYSICIAN:  Doneen Poisson, MD     DATE OF BIRTH:  [birth year]  DATE OF ADMISSION:  09/29/2010 DATE OF DISCHARGE:  10/02/2010                              DISCHARGE SUMMARY   DISCHARGE DIAGNOSES: 1. Altered mental status likely secondary to medications and     pneumonia. 2. Aspiration pneumonia. 3. Chronic respiratory insufficiency. 4. Normocytic anemia. 5. Type 2 diabetes. 6. Coronary artery disease status post non-ST elevation myocardial     infarction in January 2012 and May 2011. 7. Stage I sacral decubitus ulcer. 8. History of prostate cancer status post radiation therapy. 9. Peripheral vascular disease status post bilateral groin stenting. 10.Aortic stenosis. 11.Glaucoma. 12.Gastroesophageal reflux disease. 13.Osteoarthritis plus degenerative disk disease status post spine     surgery at [facility]. 14.History of rotator cuff tear status post repair. 15.Status post hernia repair. 16.Status post cholecystectomy.  DISCHARGE MEDICATIONS: 1. Tylenol 650 mg by mouth every 6 hours as needed for pain. 2. Augmentin 875 mg by mouth twice daily with meals for 11 days, take     through October 13, 2010. 3. Metoprolol 25 mg by mouth twice daily. 4. Aspirin 81 mg by mouth daily. 5. Calcium carbonate one tablet by mouth daily. 6. Metformin 850 mg by mouth twice daily. 7. Nitroglycerin patch 0.6 mg an hour transdermally every morning. 8. Omeprazole 40 mg by mouth twice daily. 9. Plavix 75 mg by mouth every morning. 10.Ranolazine 500 mg by mouth twice daily. 11.Tiotropium 18 mcg inhaled every morning. 12.Terazosin 5 mg by mouth twice daily. 13.Travatan 0.004% one drop each eye daily at bedtime. 14.Vitamin D3 of 1000 units by mouth daily.  DISPOSITION AND FOLLOWUP:  [name] was discharged from the  [facility] on October 02, 2010, in stable and improved condition.  He was afebrile and sating 100% on 2 L by nasal cannula.  He is to continue his antibiotics for a total of 14 days (until October 13, 2010).  He  will need to follow up with the physician at the Skilled Nursing  Facility to which he is being discharged.  After discharge from the Skilled Nursing Facility, he should follow up with his primary care provider at [facility].  He will need a repeat chest x-ray in  mid March to ensure clearing of his pneumonia.  Care should be taken  when restarting any of the following medications: gabapentin, prednisone,  opiates, Tussionex, or Reglan.  These likely contributed to his altered mental status.  He is to continue using oxygen on discharge at  2L continuously.  He has a stage I sacral decubitus ulcer and it should be ensured that this does not worsen.  He desires to return back to  independent living and therefore we hope that his stay at the Skilled  Nursing Unit will be short-term and will allow him to do so.  CONSULTATIONS:  None.  PROCEDURES PERFORMED: 1. Imaging, September 29, 2010, CT of the head  without contrast showed     no acute intracranial abnormalities and small-vessel ischemic     disease and brain atrophy and stable right frontal convexity     encephalomalacia.  2. Two-view chest x-ray, September 29, 2010, showed improving bibasilar     air space opacities.  3. Acute abdomen series on September 30, 2010, showed no acute abdominal     abnormalities, cardiomegaly with increased airspace opacities in     the lung bases, in particular the medial left lower lobe consistent     with pneumonia.  ADMISSION HISTORY:  Eugene Bauer is an 77 year old man with an extensive past medical history who transferred to the [facility] ED from his  assisted-living facility who reports of increased lethargy, confusion,  and altered mental status.  Today, daughters were  present and provided  the majority of the history.  A daughter states that he was confused  upon waking this morning.  He confused his grandson with a fellow of  assisted living facility resident, became confused about the year and  was much more tired than usual.  His symptoms have since improved, but  the daughter says he is not completely back to his baseline.  He was  recently discharged from [facility] on September 24, 2009, after treatment  for shortness of breath, which was felt to be multifactorial.  His  treatment was for pneumonia, COPD exacerbation, non-ST elevation  myocardial infarction, and nausea and vomiting secondary to  gastroparesis.  The daughter notes he continued to cough, although was  unable to produce any sputum.  There were no fevers, rigors, syncope, or   chest pain reported by daughters or by the ALS staff.  They note that he  took his oxygen cannula off this morning with a subsequent drop in his O2 saturations to the 80s.  This eventually improved as he was placed back  on his nasal cannula.  He also sustained a fall to this left side 3 days  prior to arrival, but only sustained minor trauma to his right arm.   They also report he has not been eating well and has developed  significant left lower extremity edema which is very unusual for him.   He denies any shortness of breath, chest pain, sweats, fevers, chills,  syncope, dyspnea on exertion, abdominal pain, nausea, vomiting, diarrhea,  headache, vision changes, weakness, tingling or numbness, dysuria, bright  red blood per rectum, tarry black stools, or other complaints.  ADMITTING PHYSICAL EXAMINATION: VITALS:  Temperature 98.6, pulse 68, blood pressure 123/47, respiratory  rate 14, O2 Sats 97% on 4 L by nasal cannula. GENERAL:  Alert, in no acute distress, cooperative. HEAD:  Normocephalic and atraumatic. EYES: Pupils equal, round, and reactive to light and accommodation. Extraocular movements intact.   Vision grossly intact.  Conjunctivae and sclerae within normal limits. MOUTH:  Mucous membranes dry.  No erythema, exudates, or lesions. NECK:  Supple, full range of motion.  Trachea midline.  No palpable masses or JVD. LUNGS:  Crackles noted in the right lower lung, decreased breath sounds throughout the bilateral lung fields.  No wheezes or rhonchi. Good air movement.  Normal respiratory effort. HEART:  Regular rate and rhythm, 4/6 systolic murmur, loudest at the right and left second intercostal spaces, clear, S1 and S2. ABDOMEN:  Tense, distended with increased tympany, nontender, present bowel sounds are normoactive.  No palpable masses. MUSCULOSKELETAL:  Without joint swelling, warmth, edema, or erythema. NEUROLOGIC:  Cranial nerves II through XII are  intact.  Able to follow commands.  There is 4+ strength in the right upper extremity and lower extremity, which is chronic.  5+ grip strength bilaterally, 5+ strength  in the right upper extremity and right lower extremity.  Sensation is  grossly intact. SKIN:  10 cm area of erythema and skin breakdown surrounding the sacrum.  T here are no draining lesions. EXTREMITIES:  2+ pitting edema present in bilateral lower extremities extending from mid shin. PSYCHIATRIC:  Memory intact for recent or remote, normally interactive. Good eye contact, anxious suspected.  ADMISSION LABORATORY:  Sodium 130, potassium 5.8, chloride 96, bicarbonate 24, glucose 201, BUN 55, creatinine 1.58, total bilirubin 3.4, alkaline phosphatase 57, AST 18, ALT 33, total protein 6, albumin 2.7, calcium 9.7.  White blood cell count 14.6, hemoglobin 9.1,  hematocrit 27.1, platelets 218%, neutrophils 91.  Urinalysis was negative.  HOSPITAL COURSE:   1. Altered mental status.  This was thought likely to be secondary to     his aspiration pneumonia, complicated by several medications, a few of      them recently started.  Also complicating his altered mental  status     may be some disrupted sleep from frequent night-time urination.  After      starting treatment for pneumonia and discontinuing the offending      medications (gabapentin, prednisone, opiate medications, Tussionex,and      Reglan), his mental status rapidly improved.  On the day prior to      discharge, a mini-mental status exam was performed, this was October 01, 2010, which showed 27/30 points with excellent clock drawing.  By     the day of discharge, he said he felt totally normal. His behavior      was confirmed as normal by his daughters who were frequently present      at the bedside.  Of note, he did have a fall approximately 3 days prior      to presentation, but head CT was negative for any evidence of subdural      hematoma, acute stroke or any other reason for his altered mental     status.  His neuro exam was otherwise unchanged during the admission.  2. Aspiration pneumonia, in the setting of altered mental status,and      given the location of the infiltrate on x-ray (right lower lobe superior     subsegment) it was thought very likely that he had an aspiration      pneumonia.  He was initially placed on Zosyn, remained afebrile during      the admission and on the day prior to discharge was transitioned to     Augmentin.  His O2 requirements remained stable at approximately 2     L nasal cannula to maintain his O2 saturations above 90%.  His     antibiotic should be continued for a total of 14 days and the     patient will need a repeat chest x-ray in approximately 4 weeks     to confirm complete resolution of pneumonia.  3. Chronic respiratory insufficiency/chronic obstructive pulmonary     disease.  We do not have records of any pulmonary function tests.       He was discharged from his last [facility] admission on     oxygen 2 L by nasal cannula to be used continuously.  This     requirement did not increase during this admission by the day  of      discharge, in fact, he was 100% on 2 L by nasal cannula.  He did     respond to nebulizer treatments subjectively.  On discharge, we     restarted his home tiotropium.  4. Hyponatremia.  Mr. Dirr appeared dry on admission and he did     have acute renal failure.  We doubted syndrome of inappropriate     antidiuretic hormone given his BUN on admission, though he does     have a pulmonary process that was active at that time.  His     hyponatremia resolved with hydration and was normal by discharge.  5. Hyperkalemia.  This was felt to be secondary to his acute renal      failure and resolved with hydration.  At no point, did he have      EKG changes related to the hyperkalemia nor did he require any      other interventions such as Kayexalate.  6. Acute renal failure.  Pre-renal.  On admission, his creatinine was     1.6 and had decreased with IV fluids to 1.0.  7. Type 2 diabetes.  Mr. Galdamez measured hemoglobin A1c was 6.8%.     Very strict control is inappropriate in this patient given his age and     comorbidities.  His metformin was held on admission given his acute     renal failure, but restarted on discharge.  8. Coronary artery disease.  Mr. Lalor is status post 2 non-ST     elevation myocardial infarctions in the past year.  His home     aspirin, Plavix, and metoprolol were continued.  He has a history     of angina for which he is on a nitroglycerin patch as an     outpatient.  He had one episode of chest pain lasting for 10 minutes     and was relieved with one nitroglycerin sublingual tablet. This was      accompanied by no EKG changes.  He was restarted on his nitroglycerin      patch on discharge.  He was also restarted on his home Ranolazine.  9. Stage I sacral decubitus ulcer.  Per the daughter's report prior to     admission, he had spent five nights sleeping in a chair     without moving his body much.  This likely contributed to the     formation of a  stage I sacral decubitus ulcer without any signs of     actual skin breakdown.  Wound Care was consulted and recommended an     air mattress for him during the admission.  Care should be taken on      discharge that this will be followed.  10.Chronic deconditioning in the setting of this illness and recent      hospitalizations.  He appeared quite deconditioned on     admission and during the hospitalization.  Physical therapy and     occupational therapy evaluated him and thought that he     would benefit from a short-term SNF rehabilitation prior to     returning to an independent living situation.  He and his family      were in agreement with this.  11.Anemia Normocytic and stable during this admission.     Anemia panel revealed he was replete in iron, folate, and B12.  12.Abdominal distention.  This was improved throughout the admission     likely related  to constipation.  He was on opiates prior to     admission.  He was given MiraLax and Colace and had several bowel     movements prior to discharge.  CODE STATUS:  He desired to be a do not resuscitate/do not intubate during this admission and this was maintained.  DISCHARGE VITALS:  Temperature 98.3, pulse 88, blood pressure 147/60, respiratory rate 18, O2 Sats 100% on 2 L by nasal cannula.  DISCHARGE LABORATORY: 1. Hemoglobin A1c 6.8. 2. Blood cultures x 2 no growth to date. 3. BMET performed on October 01, 2010 showed sodium 135, potassium     4.3, chloride 96, bicarbonate 28, BUN 19, creatinine 1.03, glucose     154.  CBC, October 01, 2010, showed white blood cell count 13.6,     hemoglobin 9.2, hematocrit 27.4, platelet 232.  FOBT was negative.     Anemia panel, iron 45, TIBC 208, saturation 22%, vitamin B12     271, folate 13.4, ferritin 266.   ______________________________ Danelle Berry, MD   ______________________________ Doneen Poisson, MD  JW/MEDQ  D:  10/02/2010  T:  10/02/2010  Job:   [id number]  Electronically Signed by Danelle Berry MD on 10/14/2010 12:28:02 PM Electronically Signed by Doneen Poisson  on 10/17/2010 07:17:59 PM

## 2010-10-21 ENCOUNTER — Ambulatory Visit: Payer: Medicare Other | Admitting: Ophthalmology

## 2010-11-13 LAB — DIFFERENTIAL
Basophils Relative: 0 % (ref 0–1)
Eosinophils Absolute: 0 10*3/uL (ref 0.0–0.7)
Eosinophils Relative: 1 % (ref 0–5)
Lymphs Abs: 0.6 10*3/uL — ABNORMAL LOW (ref 0.7–4.0)
Lymphs Abs: 0.8 10*3/uL (ref 0.7–4.0)
Monocytes Absolute: 0.8 10*3/uL (ref 0.1–1.0)
Monocytes Absolute: 0.9 10*3/uL (ref 0.1–1.0)
Monocytes Relative: 14 % — ABNORMAL HIGH (ref 3–12)
Monocytes Relative: 16 % — ABNORMAL HIGH (ref 3–12)
Neutro Abs: 3.5 10*3/uL (ref 1.7–7.7)
Neutrophils Relative %: 66 % (ref 43–77)

## 2010-11-13 LAB — BASIC METABOLIC PANEL
CO2: 28 mEq/L (ref 19–32)
Calcium: 9.7 mg/dL (ref 8.4–10.5)
Creatinine, Ser: 1.04 mg/dL (ref 0.4–1.5)
GFR calc Af Amer: >60 mL/min (ref >60)
GFR calc non Af Amer: >60 mL/min (ref >60)
Sodium: 135 mEq/L (ref 135–145)

## 2010-11-13 LAB — POCT I-STAT, CHEM 8
BUN: 22 mg/dL (ref 6–23)
Calcium, Ion: 1.28 mmol/L (ref 1.12–1.32)
Chloride: 100 mEq/L (ref 96–112)
Creatinine, Ser: 1.2 mg/dL (ref 0.4–1.5)
Glucose, Bld: 142 mg/dL — ABNORMAL HIGH (ref 70–99)
HCT: 32 % — ABNORMAL LOW (ref 39.0–52.0)
Potassium: 4.2 mEq/L (ref 3.5–5.1)

## 2010-11-13 LAB — CBC
HCT: 28.7 % — ABNORMAL LOW (ref 39.0–52.0)
HCT: 29.6 % — ABNORMAL LOW (ref 39.0–52.0)
Hemoglobin: 10.4 g/dL — ABNORMAL LOW (ref 13.0–17.0)
Hemoglobin: 9.9 g/dL — ABNORMAL LOW (ref 13.0–17.0)
MCH: 30.6 pg (ref 26.0–34.0)
MCH: 31 pg (ref 26.0–34.0)
MCHC: 35.1 g/dL (ref 30.0–36.0)
MCV: 88.1 fL (ref 78.0–100.0)
Platelets: 175 10*3/uL (ref 150–400)
RBC: 3.24 MIL/uL — ABNORMAL LOW (ref 4.22–5.81)
RBC: 3.36 MIL/uL — ABNORMAL LOW (ref 4.22–5.81)
RDW: 13.9 % (ref 11.5–15.5)
WBC: 5.8 10*3/uL (ref 4.0–10.5)

## 2010-11-13 LAB — URINALYSIS, ROUTINE W REFLEX MICROSCOPIC
Bilirubin Urine: NEGATIVE
Glucose, UA: NEGATIVE mg/dL
Ketones, ur: NEGATIVE mg/dL
Nitrite: NEGATIVE
Specific Gravity, Urine: 1.008 (ref 1.005–1.030)
pH: 7 (ref 5.0–8.0)

## 2010-11-13 LAB — CK TOTAL AND CKMB (NOT AT ARMC)
CK, MB: 2.1 ng/mL (ref 0.3–4.0)
CK, MB: 3.2 ng/mL (ref 0.3–4.0)
Relative Index: INVALID (ref 0.0–2.5)
Total CK: 68 U/L (ref 7–232)
Total CK: 82 U/L (ref 7–232)

## 2010-11-13 LAB — TROPONIN I
Troponin I: 0.01 ng/mL (ref 0.00–0.06)
Troponin I: 0.03 ng/mL (ref 0.00–0.06)

## 2010-11-13 LAB — PROTIME-INR: INR: 0.93 (ref 0.00–1.49)

## 2010-11-13 LAB — GLUCOSE, CAPILLARY
Glucose-Capillary: 120 mg/dL — ABNORMAL HIGH (ref 70–99)
Glucose-Capillary: 134 mg/dL — ABNORMAL HIGH (ref 70–99)

## 2010-11-13 LAB — HEMOGLOBIN A1C: Mean Plasma Glucose: 134 mg/dL — ABNORMAL HIGH (ref <117)

## 2010-11-13 LAB — MRSA PCR SCREENING: MRSA by PCR: NEGATIVE

## 2010-12-01 LAB — URINALYSIS, ROUTINE W REFLEX MICROSCOPIC
Glucose, UA: NEGATIVE mg/dL
Hgb urine dipstick: NEGATIVE
Ketones, ur: NEGATIVE mg/dL
Protein, ur: NEGATIVE mg/dL
pH: 7.5 (ref 5.0–8.0)

## 2010-12-01 LAB — COMPREHENSIVE METABOLIC PANEL
ALT: 16 U/L (ref 0–53)
AST: 21 U/L (ref 0–37)
Alkaline Phosphatase: 52 U/L (ref 39–117)
Calcium: 9.6 mg/dL (ref 8.4–10.5)
GFR calc Af Amer: >60 mL/min (ref >60)
Potassium: 4.1 mEq/L (ref 3.5–5.1)
Sodium: 132 mEq/L — ABNORMAL LOW (ref 135–145)
Total Protein: 6.3 g/dL (ref 6.0–8.3)

## 2010-12-01 LAB — CBC
MCHC: 34.5 g/dL (ref 30.0–36.0)
RBC: 3.74 MIL/uL — ABNORMAL LOW (ref 4.22–5.81)
RDW: 13.6 % (ref 11.5–15.5)

## 2010-12-01 LAB — URINE CULTURE
Colony Count: NO GROWTH
Culture: NO GROWTH

## 2010-12-01 LAB — LIPID PANEL
Cholesterol: 249 mg/dL — ABNORMAL HIGH (ref 0–200)
LDL Cholesterol: 167 mg/dL — ABNORMAL HIGH (ref 0–99)
VLDL: 31 mg/dL (ref 0–40)

## 2010-12-01 LAB — HOMOCYSTEINE: Homocysteine: 10 umol/L (ref 4.0–15.4)

## 2010-12-01 LAB — POCT CARDIAC MARKERS
CKMB, poc: 1.2 ng/mL (ref 1.0–8.0)
Myoglobin, poc: 70.9 ng/mL (ref 12–200)
Troponin i, poc: <0.05 ng/mL (ref 0.00–0.09)

## 2010-12-01 LAB — CK TOTAL AND CKMB (NOT AT ARMC)
CK, MB: 2.1 ng/mL (ref 0.3–4.0)
Relative Index: INVALID (ref 0.0–2.5)
Total CK: 70 U/L (ref 7–232)

## 2010-12-01 LAB — ANA: Anti Nuclear Antibody(ANA): NEGATIVE

## 2010-12-01 LAB — DIFFERENTIAL
Eosinophils Absolute: 0.1 10*3/uL (ref 0.0–0.7)
Eosinophils Relative: 1 % (ref 0–5)
Lymphs Abs: 0.9 10*3/uL (ref 0.7–4.0)
Monocytes Absolute: 0.6 10*3/uL (ref 0.1–1.0)
Monocytes Relative: 10 % (ref 3–12)

## 2010-12-01 LAB — CARDIAC PANEL(CRET KIN+CKTOT+MB+TROPI): Total CK: 68 U/L (ref 7–232)

## 2010-12-01 LAB — GLUCOSE, CAPILLARY: Glucose-Capillary: 157 mg/dL — ABNORMAL HIGH (ref 70–99)

## 2011-01-12 NOTE — Discharge Summary (Signed)
NAME:  Eugene Bauer, Eugene Bauer.:  [id number]   MEDICAL RECORD NO.:  [id number]          PATIENT TYPE:  INP   LOCATION:  3733                         FACILITY:  [facility]   PHYSICIAN:  Jonelle Sidle, MD DATE OF BIRTH:  1927-07-09   DATE OF ADMISSION:  11/02/2007  DATE OF DISCHARGE:  11/03/2007                               DISCHARGE SUMMARY   PRIMARY CARE Denicia Pagliarulo:  Dr. Georgia Duff at the [facility].  The  patient also has a cardiologist, but he is not sure of the name.   DISCHARGE DIAGNOSIS:  Chest pain.   SECONDARY DIAGNOSES:  1. Reported history of nonobstructive coronary artery disease, status      post 3 catheterization at the [facility], the last in 2007.  2. Hypertension.  3. Hyperlipidemia.  4. Type 2 diabetes mellitus.  5. 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  6. Gastroesophageal reflux disease.  7. History of prostate cancer, status post radiation.  8. History of rectal bleeding.  9. Glaucoma.  10.Peripheral vascular disease/carotid disease, status post      percutaneous transluminal angioplasty to bilateral a groins in      2007 per patient  11.Chronic obstructive pulmonary disease.  12.Basal cell carcinoma.  13.Degenerative joint disease.  14.Status post cholecystectomy.  15.Status post right shoulder surgery, September 14, 2007.  16.Status post cervical spine surgery   ALLERGIES:  CODEINE, IODINE and ZOCOR and ZETIA have caused severe  myalgias.   PROCEDURES:  None.   HISTORY OF PRESENT ILLNESS:  [age]-year-old Caucasian male with  reported history of nonobstructive CAD, with multiple catheterizations  at the [facility] in [place].  He presented to the [facility]  Emergency Room on November 02, 2007 after developing 7/10 mid scapular  substernal chest heaviness with associated shortness of breath and  diaphoresis that occurred while  getting his breakfast ready that  morning.  Over the span of about 1 hour, he took 4 sublingual  nitroglycerin with slowly easing-off of discomfort and then finally  resolution after the 4th nitroglycerin.  His daughter drove him to the  [facility] ED in [place], where ECG showed no acute changes and  point-of-care markers were negative.  The patient was admitted for  further evaluation.   HOSPITAL COURSE:  Mr. Moffatt was transferred to [facility] and ruled  out for MI by cardiac markers.  ECG remained stable without any acute  changes.  He has been ambulating without recurrent discomfort.  We have  increased his atenolol to 50 mg daily.  We feel the patient requires at  a minimum outpatient stress testing; however, he prefers to be followed  up at the [facility].  We will plan to discharge him home  today and his daughter is arranging followup at the [place] with Dr. Georgia Duff  next week, at which point he can be referred back to Cardiology there  for additional evaluation.   Of note, we performed a lipid profile while the patient was  here.  Total  cholesterol was 245 with an LDL of 178.  The patient has been on  simvastatin and apparently Zetia in the past, both of which were causing  significant myalgias and by patient report, muscle loss.  He is  currently using colestipol and we have recommended further discussion  with his cardiologist and primary care Maxie Debose with regards to trying a  different statin, as his lipids are currently poorly controlled.   DISCHARGE LABORATORY DATA:  Hemoglobin 11.6, hematocrit 32.9, WBC 5.5,  platelets 155.  INR 1, D-dimer 0.47.  Sodium 136, potassium 3.1,  chloride 104, CO2 26, BUN 20, creatinine 0.96, glucose 143, calcium 9.8.  CK 51, MB 2.1, troponin I 0.01.  Total cholesterol 245, triglycerides  139, HDL 39, LDL 178.  TSH 0.495.   DISPOSITION:  The patient is being discharged home today in good  condition.   FOLLOWUP PLAN AND  APPOINTMENTS:  He is going to follow up with his  primary care Domanic Matusek, Dr. Georgia Duff, at the [facility] next  week.   DISCHARGE MEDICATIONS:  We have increased his atenolol 50 mg daily.  Otherwise he will remain on his prior home medications which include:  1. Aspirin 81 mg daily.  2. Aggrenox 200/25 mg b.i.d.  3. Combivent inhaler 2 puffs t.i.d.  4. Glucophage 500 mg b.i.d.  5. Loratadine 10 mg daily.  6. Cozaar 50 mg daily.  7. Nitroglycerin patch 0.6 mg daily, off at bedtime.  8. Nitroglycerin 0.4 mg sublingual p.r.n. chest pain.  9. Prilosec 20 mg daily.  10.Sulindac 1 tablet b.i.d.  11.Terazosin 5 mg b.i.d.  12.Travatan 0.004% one drop, both eyes, nightly.  13.Vicodin 5/500 mg p.r.n.  14.Fish oil 1000 mg t.i.d.  15.Vitamin B 50 mg daily.  16.Vitamin C 1000 mg b.i.d.  17.Calcium plus D 600 mg daily.  18.Colestipol 2 packets daily.  19.Tylenol p.r.n.   OUTSTANDING LABORATORY STUDIES:  None.   DURATION OF DISCHARGE ENCOUNTER:  Forty-eight minutes including  physician time.      Nicolasa Ducking, ANP      Jonelle Sidle, MD  Electronically Signed    CB/MEDQ  D:  11/03/2007  T:  11/03/2007  Job:  [id number]   cc:   [facility] Dr. Georgia Duff,

## 2011-01-12 NOTE — H&P (Signed)
NAME:  Eugene Bauer, Eugene Bauer.:  [id number]   MEDICAL RECORD NO.:  [id number]          PATIENT TYPE:  EMS   LOCATION:  ED                           FACILITY:  [facility]   PHYSICIAN:  Jonelle Sidle, MD DATE OF BIRTH:  [birth year]   DATE OF ADMISSION:  11/02/2007  DATE OF DISCHARGE:                              HISTORY & PHYSICAL   PRIMARY CARDIOLOGIST:  Seen at the [place] in [place].   PATIENT PROFILE:  A 77 year old Caucasian male with a reported history  of nonobstructive CAD with multiple caths at the [facility] who presents to [facility] ED secondary to chest pain earlier  this morning.   PROBLEM LIST:  1. CAD.      a.     Per patient report, he has had 3 caths at the [facility]. He has never had any interventions.  The last       catheterization in 2007.  2. Hypertension.  3. Hyperlipidemia.  4. Type 2 diabetes mellitus.  5. 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  6. GERD.  7. Prostate cancer status post XRT.  8. History rectal bleeding.  9. Glaucoma.  10.Peripheral vascular disease/carotid disease.      a.     December 2006 right internal carotid artery stenosis 40-60%,       left internal carotid artery stenosis of 60-80%.      b.     Status post PTA of bilateral groins in 2007.  11.COPD.  12.Basal cell carcinoma.  13.DJD.  14.Status post cholecystectomy.  15.Status post right shoulder surgery September 14, 2007.  16.Status post C-spine surgery.   HISTORY OF PRESENT ILLNESS:  A 77 year old Caucasian male with reported  history of nonobstructive CAD having had 3 cats of the [facility] without having interventions.  He is told that he has 75%  stenosis somewhere in his heart he is not sure, but that it was not  amendable to PCI  apparently. Also he has a history of hypertension,  hyperlipidemia and diabetes, PVD and CVA.  He presented to the [facility] ED  this morning after developing 7/10 mid-scapular and substernal  chest heaviness with associated shortness of breath, diaphoresis  occurring while getting his breakfast ready this morning.  Over the span  of about 1 hour between 7:15 and 8:15 a.m., he took a total of 4  sublingual nitroglycerin tablets with initially easing off of discomfort  and then complete resolution after the fourth nitro.  His daughter  brought him to the [facility] ED and he is currently pain free and his  ECG shows no acute changes.  Point care markers are negative x1. Notably  prior to mid February or so he would have to take a nitroglycerin for  chest discomfort maybe once every 4-6 months but he had three episodes  of resting discomfort in February all relieved by nitro.   ALLERGIES:  CODEINE, IODINE and IV CONTRAST.  HOME MEDICATIONS:  1. Aggrenox 200/25 mg b.i.d.  2. Aspirin 81 mg daily.  3. Atenolol 25 mg daily.  4. Colestipol 2 packets at lunch time daily.  5. Combivent inhaler 14.7/200 two puffs t.i.d.  6. Glucophage 500 mg b.i.d.  7. Loratadine 10 mg daily.  8. Losartan 50 mg daily.  9. Nitroglycerin p.r.n.  10.Nitroglycerin patch 0.6 mg daily.  11.Prilosec 20 mg  daily.  12.Sulindac 1 b.i.d.  13.Terazosin 5 b.i.d.  14.Travatan 1 drop OU daily.  15.Vicodin 5/500 mg p.r.n.  16.Fish oil 1000 mg t.i.d.  17.Vitamin B 50 mg daily.  18.Vitamin C 1000 mg b.i.d.  19.Calcium 600 mg daily.  20.Tylenol p.r.n.   FAMILY HISTORY:  Mother died of heart problems as well as CVAs in her  64s.  Father died at 49 of multiple MIs. He had a brother who died at 41  of an MI, another brother who died at 38 with MI, a sister died at 45 of  an MI, another sister died at 37 of an MI. I fit   SOCIAL HISTORY:  He lives in [place] with his daughter.  He is  retired.  He smoked off and on for about 20 years but quit about 30  years ago.  He hardly ever has an alcoholic beverage.  He denies any  drug use.  He walks  with a cane and in general is not that active but  previously has not had exertional chest discomfort.   REVIEW OF SYSTEMS:  Positive for chest pain, shortness of breath,  history of diabetes.  He has weakness of his left foot and sometimes  clumsy walking.  He has had urinary frequency and also malodorous urine  more recently.  Otherwise all systems reviewed and negative.   PHYSICAL EXAMINATION:  Temperature 97.6, heart rate 73, respirations 18,  blood pressure 127/56, pulse ox 98% on 2 liters.  A pleasant, white male in no acute distress.  Awake, alert and oriented  x3.  HEENT:  Normal.  NEURO:  Grossly intact and nonfocal.  SKIN:  Warm and dry without lesions or masses.  NECK:  No JVD.  He has bilateral carotid bruits.  LUNGS:  Respirations regular and unlabored with diminished breath sounds  throughout and a few basilar crackles.  CARDIAC:  Regular S1, S2, no S3, S4.  He has a 2/6 systolic murmur heard  throughout, loudest both at the right upper substernal border as well as  at the apex radiating out to his axilla.  ABDOMEN:  Round, soft, nontender, nontender. Bowel sounds present x4.  EXTREMITIES:  Warm, dry, pink. No clubbing, cyanosis or edema.  He has a  loud right femoral bruit.  Dorsalis pedis pulses are 1+ bilaterally.  Chest x-ray shows no active disease.  EKG shows sinus rhythm with a  normal axis, a rate of 87 and no acute ST-T changes.  Hemoglobin 11.6,  hematocrit 32.9, WBC 5.5, platelets 155.  Sodium 136, potassium 4.1,  chloride 104, CO2 26, BUN 20, creatinine 0.96, glucose 143, D-dimer  0.47. CK-MB 1.8,  troponin-I less than 0.5, INR 1.0, calcium 9.8.   ASSESSMENT/PLAN:  1. Unstable angina. He is currently pain free.  We plan to admit him      to [facility] and cycle cardiac enzymes.  Add heparin.  Continue      nitrite, aspirin and beta blocker, ARB.  He is not on a statin      secondary to prior severe myalgias and loss of  some muscle function      apparently  while on Simvastatin and then subsequently with Zetia as      well.  He takes colestipol.  If cardiac enzymes are negative.  We      would likely plan on discharge in the a.m. with outpatient follow-      up at the [facility] with his cardiologist.  If we feel as though he      requires further inpatient evaluation and/or cardiac markers are      abnormal, the patient would like to be transferred to the [facility]      for further therapy.  2. Hypertension stable.  3. Hyperlipidemia. Check lipids and LFTs.  The patient is intolerant      to statins. Continue colestipol.  4. Diabetes.  Hold Glucophage, add sliding-scale insulin.  5. Chronic obstructive pulmonary disease. Continue inhalers.  6. Gastroesophageal reflux disease. Continue proton pump inhibitor.  7. Glaucoma.  Continue eyedrops      Nicolasa Ducking, ANP      Jonelle Sidle, MD  Electronically Signed    CB/MEDQ  D:  11/02/2007  T:  11/03/2007  Job:  [id number]

## 2011-01-15 NOTE — H&P (Signed)
NAME:  Eugene Bauer, Eugene Bauer NO.:  [id number]   MEDICAL RECORD NO.:  [id number]          PATIENT TYPE:  IPS   LOCATION:  4029                         FACILITY:  [facility]   PHYSICIAN:  Ranelle Oyster, M.D.DATE OF BIRTH:  1927/04/11   DATE OF ADMISSION:  08/18/2005  DATE OF DISCHARGE:  08/18/2005                                HISTORY & PHYSICAL   CHIEF COMPLAINT:  Left-sided weakness.   HISTORY OF PRESENT ILLNESS:  This is a 77 year old male admitted with left-  sided weakness on December 17.  MRI revealed a right cortical infarct  involving the frontoparietal regions in multiple areas.  It was felt that  this was a result of embolic stroke in the right MCA distribution.  Work-up  revealed carotid stenosis 40-60% on the right and 60-80% on the left.  TEE  was negative for thrombus.  Patient had some intermittent rectal bleeding  after admission which was monitored.  Colonoscopy was negative except for a  few diverticula.  Patient is on Aggrenox for stroke prophylaxis and  subcutaneous Lovenox for DVT prophylaxis.  Was admitted to inpatient  rehabilitation to further improve his functional status.   REVIEW OF SYSTEMS:  Positive for weakness, reflux, shortness of breath, and  cough.  Full review of systems is in the written H&P.   PAST MEDICAL HISTORY:  1.  Emphysema.  2.  Basal cell carcinoma.  3.  Degenerative disk disease.  4.  GERD.  5.  Rectal bleeding.  6.  Diabetes mellitus.  7.  Hernia repair.  8.  Cholecystectomy.  9.  C-spine surgery.  10. Prostate cancer status post XRT.  11. Bilateral shoulder surgery.  12. Glaucoma.   Patient's history is negative for alcohol or tobacco use.   FAMILY HISTORY:  Positive for coronary artery disease, diabetes.   SOCIAL HISTORY:  Patient has recently moved to [place] and lives with his  daughter.  Daughter works during the day.  He has some local family in the  [address] and [place] areas.  Functionally, patient  was independent with a  cane and driving prior to arrival.   MEDICATIONS PRIOR TO ARRIVAL:  Atenolol, Metformin, nitroglycerin patch,  omeprazole, terazosin, aspirin, Os-Cal, Therabid, and Combivent, as well as  latanoprost.   ALLERGIES:  CODEINE and IODINE.   LABORATORIES:  Homocysteine level 11.9, hemoglobin A1c 6.9, hematocrit 32,  hemoglobin 11.3, white count 5.2.  Sodium 138, potassium 3.6, BUN and  creatinine 22 and 1.2.   PHYSICAL EXAMINATION:  VITAL SIGNS:  Blood pressure 142/60, pulse 78,  respiratory rate 18, temperature 98.4.  GENERAL:  Patient is alert, oriented x3.  Affect is fairly bright, although  he is on a flat side in personality.  HEENT:  Pupils are equal, round, and reactive to light and accommodation.  Extraocular eye movements are intact.  Ear, nose, and throat examination was  unremarkable.  Oral mucosa is pink and moist.  NECK:  Supple without JVD or lymphadenopathy.  CHEST:  Clear to auscultation bilaterally without wheezes, rales, or  rhonchi.  HEART:  Regular rate and rhythm without murmurs, rubs,  or gallops.  ABDOMEN:  Soft, nontender.  Bowel sounds positive.  NEUROLOGIC:  Cranial nerves II-XII revealed left central 7th.  Patient has  some decreased pin prick and light touch on the left upper extremity as well  as somewhat in the face.  Patient had some mild functional deficits to the  left side as well today.  Reflexes were decreased throughout the left side  and somewhat on the right as well.  Judgment, orientation, and memory seem  to be generally appropriate.  Motor function is in the area of 4/5 in the  left upper and 4/5 proximal lower extremity, 2/5 APF, 0/5 APF today.  Right  lower extremity and upper extremity were 5/5 today.   ASSESSMENT/PLAN:  1.  Functional deficits secondary to right middle cerebral artery stroke      with left hemiparesis.  Beginning comprehensive inpatient rehabilitation      including physical therapy for gait and  mobility, occupational therapy      for activities of daily living and self care.  Nursing will assess and      care for bowel and bladder needs as well as skin issues.  Case      management, social work to assess psychosocial status.  Estimated length      of stay is two to three weeks.  Prognosis fair to good.  Goals are      supervision to modified independent.  2.  Pain management with Tylenol p.r.n.  3.  Deep venous thrombosis prophylaxis.  Subcutaneous Lovenox 40 mg a day.  4.  Stroke prophylaxis.  Aggrenox one daily for two weeks, then one b.i.d.  5.  Diabetes.  Continue Glucophage.  Monitor CBGs and use sliding scale      insulin as needed.  6.  Rectal bleeding.  Monitor bowel function and stools.  Continue Protonix      40 mg b.i.d.  7.  Heart.  Continue Nitro-Dur, atenolol.  8.  Glaucoma.  Continue Xalatan.  9.  Hyperlipidemia.  Monitor on Zetia and Zocor.  10. Emphysema.  Patient will use Atrovent, Combivent, and Flovent daily.      Ranelle Oyster, M.D.  Electronically Signed     ZTS/MEDQ  D:  08/18/2005  T:  08/19/2005  Job:  [id number]

## 2011-01-15 NOTE — Discharge Summary (Signed)
NAME:  Eugene Bauer, Eugene Bauer NO.:  [id number]   MEDICAL RECORD NO.:  [id number]          PATIENT TYPE:  IPS   LOCATION:  4029                         FACILITY:  [facility]   PHYSICIAN:  Ranelle Oyster, M.D.DATE OF BIRTH:  11/07/1926   DATE OF ADMISSION:  08/18/2005  DATE OF DISCHARGE:  08/27/2005                                 DISCHARGE SUMMARY   DISCHARGE DIAGNOSES:  1.  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  2.  Subcutaneous Lovenox for deep venous thrombosis prophylaxis.  3.  Hypertension.  4.  Emphysema.  5.  Non-insulin-dependent diabetes mellitus.  6.  Hyperlipidemia.  7.  Gastroesophageal reflux disease.  8.  History of rectal bleeding and glaucoma.   HISTORY OF PRESENT ILLNESS:  This is a 77 year old male admitted August 15, 2005 with left-sided weakness.  MRI with infarction cortical surfaces,  right posterior frontal to parietal region consistent with shower of emboli  to the right middle cerebral artery.  MRA was negative.  MRA angiogram:  The  neck negative for carotid disease.  Neurology consult:  Dr. Lesia Sago.  Carotid duplex showed a 40-60% right ICA and left 60-80% ICA stenosis.  TEE  negative for thrombus with ejection fraction of 55-65%.  Gastroenterology,  Dr. Ewing Schlein, for intermittent rectal bleeding and monitoring while on  subcutaneous Lovenox and colonoscopy was negative except for some scattered  diverticula.  The patient ultimately placed on Aggrenox for CVA prophylaxis.  Remained on subcutaneous Lovenox for deep venous thrombosis prophylaxis.  He  was admitted to inpatient rehab services.   PAST MEDICAL HISTORY:  See discharge diagnoses.   HABITS:  No alcohol or tobacco.   ALLERGIES:  CODEINE and IODINE.   SOCIAL HISTORY:  Recently moved to [place] to live with his daughter.  Daughter works day shift.  Local family in [place] and [address]   MEDICATIONS PRIOR TO ADMISSION:  1.  Atenolol 25 mg  daily.  2.  Glucophage 500 mg twice daily.  3.  Nitroglycerin patch 0.6 mg daily.  4.  Prilosec twice daily.  5.  Hytrin 5 mg twice daily.  6.  Aspirin 325 daily.  7.  Os-Cal daily.  8.  Combivent inhaler and nebulizers as well as LaparoLift at bedtime.   REHABILITATION HOSPITAL COURSE:  The patient was admitted to inpatient rehab  services on December 20 with three-hour day therapy consisting of physical  therapy, occupational therapy, speech therapy as well as rehabilitation  nursing. The following issues were addressed during the patient's rehab  course.   Pertaining to Mr. Wain right middle cerebral artery infarction with  left-sided weakness.  He remained on Aggrenox for CVA prophylaxis.  He was  supervision for activities of daily living.  He was fitted with an AFO brace  for advanced prosthetics.  He was ambulating short household distances with  plan for ongoing therapy as advised per rehab services.  He did have some  right shoulder tightness.  There was questionable need for injection of  which Dr. Riley Kill would follow up with.  His blood pressures remained  controlled with  diastolic pressures 60-72 as he continued on his Cozaar and  Tenormin as well as nitroglycerin patch.  He had a history of benign  prostatic hypertrophy.  He continued with his Hytrin and monitoring of his  blood pressures while on Hytrin.  Blood sugars of 119, 127, 114 on  Glucophage 500 mg. His morning dose had been increased to 750 mg daily.  He  continued on Zocor for hyperlipidemia.  He had a history of emphysema on  Combivent as well as Flovent.  As noted above, he would remain on his  subcutaneous Lovenox until time of discharge.   LABORATORY DATA:  Latest labs showed a hemoglobin 11.2, hematocrit 31.7,  platelets 139, potassium 3.5, BUN 10, creatinine 1. Liver function studies  were within normal limits.   DISCHARGE MEDICATIONS:  1.  Zetia 10 mg daily.  2.  Cozaar 50 mg at bedtime.  3.   Zocor 40 mg daily.  4.  Combivent inhaler two puffs four times daily.  5.  Flovent 44 mcg two puffs twice daily.  6.  Tenormin 25 mg daily.  7.  Os-Cal one tablet daily.  8.  Multivitamins daily.  9.  Nitroglycerin patches 0.6 mg daily.  10. Protonix 40 mg twice daily.  11. Aggrenox one capsule daily until September 01, 2005, then increase to two      capsules and then increase to b.i.d.  12. Xalatan ophthalmic solution 0.005% one drop both eyes at bedtime.  13. Hytrin 5 mg twice daily.  14. Glucophage 750 mg in the a.m. and 500 mg in the p.m.  15. Tylenol as needed.   ACTIVITY:  As tolerated.   DIET:  Diabetic diet.   SPECIAL INSTRUCTIONS:  Continue therapies as advised by rehab services.      Mariam Dollar, P.A.      Ranelle Oyster, M.D.  Electronically Signed    DA/MEDQ  D:  08/26/2005  T:  08/26/2005  Job:  [id number]   cc:   Marlan Palau, M.D.  Fax: [phone]   Petra Kuba, M.D.  Fax: [phone]

## 2011-01-15 NOTE — Discharge Summary (Signed)
NAME:  Eugene Bauer, Eugene Bauer NO.:  [id number]   MEDICAL RECORD NO.:  [id number]          PATIENT TYPE:  INP   LOCATION:  3010                         FACILITY:  [facility]   PHYSICIAN:  Pramod P. Pearlean Brownie, MD    DATE OF BIRTH:  03-17-1927   DATE OF ADMISSION:  08/15/2005  DATE OF DISCHARGE:  08/18/2005                                 DISCHARGE SUMMARY   DIAGNOSES AT THE TIME OF DISCHARGE:  1.  Embolic right hemispheric infarcts without etiology identified.  2.  History of emphysema, bronchitis.  3.  Rectal bleeding secondary to radiation changes.  4.  Prostate cancer status post radiation therapy 2 years ago.  5.  Basal cell carcinoma of the left face that was resected.  6.  Bilateral inguinal hernia repairs.  7.  Abdominal hernia.  8.  Bilateral shoulder surgeries for bone spurs.  9.  Cervical spine surgery C5-C7 level with fusion.  10. Diabetes.  11. Gastroesophageal reflux disease.  12. Hypercholesterolemia.  13. Obesity.  14. Coronary artery disease.  15. Glaucoma.   MEDICINES AT THE TIME OF DISCHARGE:  1.  Omeprazole 20 mg a day.  2.  Nitroglycerin 0.6 mg/h patch daily.  3.  Hytrin 5 mg at bedtime.  4.  Calcium 500 mg plus D daily, 1 daily.  5.  Multivitamin 1 daily.  6.  __________ ophthalmic solution 0.005% both eyes at bedtime.  7.  Tenormin 25 mg daily.  8.  Atrovent inhaler b.i.d.  9.  Combivent inhaler 2 puffs q.i.d.  10. Zetia 10 mg a day.  11. Zocor 40 mg a day.  12. Cozaar 50 mg a bedtime.  13. Glucophage 500 mg b.i.d.  14. Aggrenox 1 p.o. daily x14 days then increase to b.i.d.  15. Flovent 2 puffs b.i.d.  16. Extra strength Tylenol 2 tablets q.6 h.  17. Anusol suppositories at bedtime beginning 08/18/2005.   HISTORY OF PRESENT ILLNESS:  Mr. Eugene Bauer is a 77 year old right-handed  white male born on 1927/06/09 with a history of diabetes, coronary  artery disease, who has been getting his medical care through the [facility].  He has a  history of prostate cancer and has had radiation therapy.  Th  patient presents to [facility] on 08/15/2005 after getting  out of his bed around 2 a.m. to go to the bathroom.  On the way back to the  bed the patient began noting some weakness involving the left leg.  Th  patient initially wanted to go to the [facility] hospital, but then eventually  decided to come to [facility] arriving shortly after 4 a.m.  The  patient reports a mild headache with no true involvement of the left arm or  face.  The right leg is normal.  Of note, the patient has reported recent  problems with rectal bleeding, and has not been adequately evaluated.  He  was told that the rectal bleeding may be secondary to postradiation changes  in the rectum after prostate radiation.  He is admitted to the hospital for  further stroke evaluation.  He was not a TPA candidate secondary to rectal  bleeding.   HOSPITAL COURSE:  MRI did reveal an acute infarct which appeared embolic.  A  complete workup including a transesophageal echocardiogram was negative for  embolic source.  Patient was placed on Aggrenox for secondary stroke  prevention without etiology identified.  GI was consulted in the hospital  and a colonoscopy was performed which did show post radiation and proctitis  with friability.  Biopsy was taken and pathology is pending.  He will be  placed on Anusol suppositories in the evening starting August 18, 2005.  Plans are to rescope in 6-8 weeks.   Patient still with physical abnormalities with therapies recommending rehab.  The patient will be transferred to rehab with plans to return home with  daughter after discharge.  Of note, the patient lived in [place] prior to  admission, but had moved in with his daughter secondary to some mold in his  apartment.   STUDIES PERFORMED:  CT of the brain was normal.  MRI of the brain showed a  cluster of diffuse and positive strokes around the cortical  surface of the  right posterior frontal-to-parietal region.  This is consistent with a  shower of emboli in the right cerebral artery.  MRA of the head is normal  with no occlusion of large or medium sized vessels.  MRA of the neck shows  no evidence of carotid bifurcation disease.  There is thought to be  minimal  smooth plaque around the posterior wall of the right ICA but no stenosis.  The left cervical ICA near the skull base is kinked, but not a significant  finding.   EKG shows sinus rhythm with a sinus arrhythmia and frequent PVCs. No old  tracings to compare.   Carotid Doppler shows 40-60% right ICA stenosis in the mid-to-upper range of  scale and left 60-80% stenosis in the low-mid-range of scale.  Vertebral  artery flow was antegrade.  Transesophageal echocardiogram showed no embolic  source.  Colonoscopy by Dr. 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  There was spontaneous hemorrhage; mucosal  biopsy taken.  The size was very limited from 0 to 5 cm.  Recommendations  were for Anusol suppositories and rescope in 6-8 weeks.  A 2-D  echocardiogram shows an ejection fraction of 55-65% with no diagnostic left  ventricular regional wall motion abnormalities.  No obvious embolic source.   LABORATORY STUDIES:  Homocystine 11.5.  CBC with hemoglobin 11.3, hematocrit  32.0, red blood cells 3.54, platelets 127, differential was normal.  Coagulation studies were normal.  Chemistry with glucose 154, total protein  5.4, albumin 3.4, liver function tests normal.  Hemoglobin A1c 6.9.  Lipid  profile with cholesterol 150, triglycerides 174, HDL 35, and LDL 80.   CONDITION AT DISCHARGE:  Patient alert and oriented x3 with no aphasia, no  dysarthria. Eye movements are full, face is smooth.  No upper extremity  drift, good grip.  Straight on left, mild left leg weakness.  Left ankle PSO  with 3/5 strength.   DISCHARGE PLAN: 1.  Transfer to rehab for  continuation of PT, OT, and speech therapy.  2.  Aggrenox for secondary stroke prevention.  3.  Followup carotid Dopplers in 6 months to one year.  4.  Followup primary care physician 1 month after discharge from rehab.  5.  Followup with Jasmine December __________ , NP at [facility] in 4 weeks after discharge.  Annie Main, N.P.    ______________________________  Sunny Schlein. Pearlean Brownie, MD    SB/MEDQ  D:  08/18/2005  T:  08/20/2005  Job:  [id number]   cc:   [facility]   Pramod P. Pearlean Brownie, MD  Fax: [phone]

## 2011-01-15 NOTE — H&P (Signed)
NAME:  Eugene Bauer.:  [id number]   MEDICAL RECORD NO.:  [id number]          PATIENT TYPE:  EMS   LOCATION:  MAJO                         FACILITY:  [facility]   PHYSICIAN:  Marlan Palau, M.D.  DATE OF BIRTH:  1926/11/10   DATE OF ADMISSION:  08/15/2005  DATE OF DISCHARGE:                                HISTORY & PHYSICAL   HISTORY OF PRESENT ILLNESS:  Eugene Bauer is a 77 year old right-handed  white male born Sep 15, 1926 with a history of diabetes, coronary  artery disease who has been getting his medical care through the [facility].  Patient has a history of prostate cancer, has had radiation  therapy.  Patient comes to [facility] this evening after  getting up out of bed around 2 a.m. today to go to the bathroom.  On the way  back patient began noting some weakness involving the left leg.  Patient  initially wanted to go to the [facility], but then eventually decided to  come to [facility] arriving shortly after 4 a.m.  Patient does  report a mild headache, no true involvement of the left arm or face, visual  field changes.  The right leg is working well.  Patient denies any prior  symptoms similar to this.  Patient, however, has reported recent problem  with rectal bleeding, has not been adequately evaluated.  Patient was told  the rectal bleeding may have been post radiation change in the rectum  following prostate radiation.   PAST MEDICAL HISTORY:  1.  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  2.  History of emphysema/bronchitis.  3.  Rectal bleeding, possible radiation changes.  This is currently active.  4.  History of prostate cancer status post radiation.  5.  Basal cell carcinoma of the left face that was resected.  6.  Bilateral inguinal hernia repairs.  7.  Abdominal hernia.  8.  Bilateral shoulder surgeries for bone spurs.  9.  Cervical  spine surgery C5-C7 level with fusion.  10. Diabetes.  11. Gastroesophageal reflux disease/peptic ulcer disease.  12. Hypercholesterolemia.  13. Obesity.  14. Coronary artery disease.  15. Glaucoma.   MEDICATIONS:  1.  AeroBid four puffs q.i.d.  2.  Asmanex two puffs q.h.s.  3.  Atenolol 25 mg daily.  4.  Atrovent nebulizer b.i.d.  5.  Combivent two puffs q.i.d.  6.  Zetia 10 mg a day.  7.  Simvastatin 40 mg a day.  8.  Xalatan 0.005% one drop both eyes q.h.s.  9.  Metformin 500 mg b.i.d.  10. Nitro patch 0.6 mg/hour one daily.  11. Sublingual nitroglycerin p.r.n.  12. Omeprazole 20 mg four b.i.d.  13. Sulindac 200 mg b.i.d.  14. Terazosin 5 mg b.i.d.  15. Aspirin 325 mg daily.  16. Calcium 600 mg a day.  17. Multivitamins.  18. Patient also takes fish oil 1000 mg t.i.d.  19. Glucosamine 1500 mg a day.  20. Tylenol if needed.  21. Vitamin C.  22. Vitamin D complex.   Patient has allergies to IODINE.  Does not smoke currently.  Drinks alcohol  on occasion.   SOCIAL HISTORY:  Patient is divorced.  Lives in the [place] area.  Is  retired.  Has five children who are all alive and well.   FAMILY HISTORY:  Mother died around age 68 with heart disease.  Father died  of MI around age 6.  Patient has had brother that died with MI, two sisters  that have died with an MI.  Diabetes runs on the father's side of the  family.  A paternal grandmother with breast cancer.   REVIEW OF SYSTEMS:  Notable for fever several weeks ago associated with  bronchitis.  Patient does note slight headache, chronic neck and shoulder  pain.  Does note chronic shortness of breath.  Denies abdominal pain, but  does have distention of the abdomen that is common.  Has had some urinary  frequency, some dizziness that has been increasing recently.   PHYSICAL EXAMINATION:  VITAL SIGNS:  Blood pressure is 132/88, heart rate  80.  Patient is afebrile.  GENERAL:  This patient is a moderately obese white  male who is alert and  cooperative at the time of examination.  HEENT:  Head is atraumatic.  Eyes:  Pupils are round, react to light.  Disks  are flat bilaterally.  NECK:  Supple.  No carotid bruits noted.  RESPIRATORY:  Clear.  CARDIOVASCULAR:  Distant heart sounds.  No obvious murmurs or rubs noted.  EXTREMITIES:  Without significant edema.  ABDOMEN:  Obese, distended.  Positive bowel sounds.  Some tenderness in the  right upper quadrant noted to palpation.  No organomegaly is noted.  NEUROLOGIC:  Cranial nerves as above.  Facial symmetry is present.  Patient  has good sensation to face to pin prick, soft touch bilaterally.  Has good  strength to facial muscles, muscles to head turning and shoulder shrug  bilaterally.  Speech is well enunciated, not aphasic.  Motor testing reveals  5/5 strength in all fours with exception of plegia noted at the left lower  extremity.  Patient has slight decrease in pin prick sensation to the left  leg as compared to the right, otherwise vibratory sensation is intact to all  fours.  Pin prick sensation in the arms is symmetric and normal.  Patient  has fair finger nose finger bilaterally.  Is able to perform heel to shin  and toe to finger on the right, cannot perform on the left.  Patient has no  visible movement of the left leg.  Patient has no drift of the upper  extremities.  Deep tendon reflexes depressed, but symmetric.  Toes are  neutral bilaterally.  Patient could not be ambulated.   No evidence of an aphasia was noted.   LABORATORIES:  INR 1.  Sodium 138, potassium 3.6, chloride 102, CO2 25,  glucose 154, BUN 22, creatinine 1.2, calcium 9, total protein 5.5, albumin  3.4, AST 20, ALT 23, alkaline phosphatase 43, total bilirubin 0.7.  White  count is 5.2, hemoglobin 11.3, hematocrit 32, MCV 90.4, platelets of 127.   CT of the head is unremarkable by my review.  No hemorrhage or acute changes seen.  Chest x-ray, EKG are pending.    IMPRESSION:  1.  New onset of plegia of the left lower extremity likely secondary to      right anterior cerebral artery distribution stroke.  2.  History of diabetes.  3.  Coronary artery disease.  4.  Active rectal bleeding.  5.  Hypercholesterolemia.   Patient does have risk factors for stroke, significant problems with  emphysema.  Has had active rectal bleeding.  Patient would have been a TPA  candidate, but due to active rectal bleeding has not received an adequate  work-up.  Patient could not be given TPA.  For the same reason patient was  not a candidate for any investigational drugs available at the moment.  Patient will be admitted at this point for further investigation.   PLAN:  1.  Admission to [facility].  2.  MRI of the brain.  3.  MR angiogram of the intracranial/extracranial vessels.  4.  2-D echocardiogram.  5.  Physical and occupational therapy evaluation.  6.  Rehabilitation consult.  7.  GI consult.  Will follow patient's clinical course while in-house.      Marlan Palau, M.D.  Electronically Signed     CKW/MEDQ  D:  08/15/2005  T:  08/16/2005  Job:  [id number]   cc:   [facility]

## 2011-01-15 NOTE — Discharge Summary (Signed)
NAME:  Eugene Bauer, Eugene Bauer.:  [id number]   MEDICAL RECORD NO.:  [id number]          PATIENT TYPE:  IPS   LOCATION:  4029                         FACILITY:  [facility]   PHYSICIAN:  Ranelle Oyster, M.D.DATE OF BIRTH:  [birth year]   DATE OF ADMISSION:  08/18/2005  DATE OF DISCHARGE:                                 DISCHARGE SUMMARY   ADDENDUM:  Discharge was extended to August 28, 2005, from December 29 to  help establish full completion of family teaching and the family to be able  to provide the necessary assistance at home.  All issues had been discussed  with rehab staff.  The patient remained medically stable and was discharged  without changes in medications.  Follow-ups had been arranged as noted in  body of discharge summary.      Mariam Dollar, P.A.      Ranelle Oyster, M.D.  Electronically Signed    DA/MEDQ  D:  08/26/2005  T:  08/26/2005  Job:  [id number]

## 2011-05-24 LAB — LIPID PANEL
Cholesterol: 245 — ABNORMAL HIGH
HDL: 39 — ABNORMAL LOW

## 2011-05-24 LAB — CK TOTAL AND CKMB (NOT AT ARMC): CK, MB: 2.4

## 2011-05-24 LAB — TSH
TSH: 1.524
TSH: 0.495

## 2011-05-24 LAB — D-DIMER, QUANTITATIVE: D-Dimer, Quant: 0.47

## 2011-05-24 LAB — POCT CARDIAC MARKERS
CKMB, poc: 1.8
Myoglobin, poc: 63.1

## 2011-05-24 LAB — BASIC METABOLIC PANEL
Calcium: 9.8
Creatinine, Ser: 0.96
GFR calc Af Amer: >60
GFR calc non Af Amer: >60

## 2011-05-24 LAB — DIFFERENTIAL
Lymphs Abs: 0.8
Monocytes Relative: 9
Neutro Abs: 4.1
Neutrophils Relative %: 75

## 2011-05-24 LAB — CBC
RBC: 3.61 — ABNORMAL LOW
WBC: 5.5

## 2011-05-24 LAB — CARDIAC PANEL(CRET KIN+CKTOT+MB+TROPI)
CK, MB: 2.4
Relative Index: INVALID
Relative Index: INVALID
Total CK: 60
Troponin I: <0.01

## 2011-05-24 LAB — PROTIME-INR
INR: 1
Prothrombin Time: 13

## 2011-07-28 ENCOUNTER — Inpatient Hospital Stay (HOSPITAL_COMMUNITY)
Admission: EM | Admit: 2011-07-28 | Discharge: 2011-07-31 | DRG: 153 | Disposition: A | Discharge status: Home / Self Care | Payer: Medicare Other | Source: Ambulatory Visit | Attending: Family Medicine | Admitting: Family Medicine

## 2011-07-28 ENCOUNTER — Emergency Department (INDEPENDENT_AMBULATORY_CARE_PROVIDER_SITE_OTHER)
Admission: EM | Admit: 2011-07-28 | Discharge: 2011-07-28 | Disposition: A | Discharge status: Nursing Home (Includes ICF) | Payer: Medicare Other | Source: Home / Self Care | Attending: Emergency Medicine | Admitting: Emergency Medicine

## 2011-07-28 ENCOUNTER — Emergency Department (HOSPITAL_COMMUNITY): Payer: Medicare Other

## 2011-07-28 ENCOUNTER — Other Ambulatory Visit: Payer: Self-pay

## 2011-07-28 ENCOUNTER — Encounter (HOSPITAL_COMMUNITY): Payer: Self-pay | Admitting: Emergency Medicine

## 2011-07-28 DIAGNOSIS — C61 Malignant neoplasm of prostate: Secondary | ICD-10-CM | POA: Diagnosis present

## 2011-07-28 DIAGNOSIS — I252 Old myocardial infarction: Secondary | ICD-10-CM

## 2011-07-28 DIAGNOSIS — J111 Influenza due to unidentified influenza virus with other respiratory manifestations: Secondary | ICD-10-CM

## 2011-07-28 DIAGNOSIS — R509 Fever, unspecified: Secondary | ICD-10-CM

## 2011-07-28 DIAGNOSIS — I5032 Chronic diastolic (congestive) heart failure: Secondary | ICD-10-CM | POA: Diagnosis present

## 2011-07-28 DIAGNOSIS — I2510 Atherosclerotic heart disease of native coronary artery without angina pectoris: Secondary | ICD-10-CM

## 2011-07-28 DIAGNOSIS — R0602 Shortness of breath: Secondary | ICD-10-CM

## 2011-07-28 DIAGNOSIS — E119 Type 2 diabetes mellitus without complications: Secondary | ICD-10-CM | POA: Diagnosis present

## 2011-07-28 DIAGNOSIS — J449 Chronic obstructive pulmonary disease, unspecified: Secondary | ICD-10-CM

## 2011-07-28 DIAGNOSIS — H349 Unspecified retinal vascular occlusion: Secondary | ICD-10-CM | POA: Diagnosis present

## 2011-07-28 DIAGNOSIS — K219 Gastro-esophageal reflux disease without esophagitis: Secondary | ICD-10-CM | POA: Diagnosis present

## 2011-07-28 DIAGNOSIS — Z8673 Personal history of transient ischemic attack (TIA), and cerebral infarction without residual deficits: Secondary | ICD-10-CM

## 2011-07-28 DIAGNOSIS — E7800 Pure hypercholesterolemia, unspecified: Secondary | ICD-10-CM | POA: Diagnosis present

## 2011-07-28 DIAGNOSIS — I509 Heart failure, unspecified: Secondary | ICD-10-CM | POA: Diagnosis present

## 2011-07-28 DIAGNOSIS — I214 Non-ST elevation (NSTEMI) myocardial infarction: Secondary | ICD-10-CM

## 2011-07-28 DIAGNOSIS — R079 Chest pain, unspecified: Secondary | ICD-10-CM

## 2011-07-28 DIAGNOSIS — I359 Nonrheumatic aortic valve disorder, unspecified: Secondary | ICD-10-CM | POA: Diagnosis present

## 2011-07-28 DIAGNOSIS — Z79899 Other long term (current) drug therapy: Secondary | ICD-10-CM

## 2011-07-28 HISTORY — DX: Shortness of breath: R06.02

## 2011-07-28 HISTORY — DX: Pure hypercholesterolemia, unspecified: E78.00

## 2011-07-28 HISTORY — DX: Cerebral infarction, unspecified: I63.9

## 2011-07-28 HISTORY — DX: Cardiac murmur, unspecified: R01.1

## 2011-07-28 HISTORY — DX: Angina pectoris, unspecified: I20.9

## 2011-07-28 HISTORY — DX: Gastro-esophageal reflux disease without esophagitis: K21.9

## 2011-07-28 HISTORY — DX: Chronic obstructive pulmonary disease, unspecified: J44.9

## 2011-07-28 HISTORY — DX: Atherosclerotic heart disease of native coronary artery without angina pectoris: I25.10

## 2011-07-28 HISTORY — DX: Unspecified retinal vascular occlusion: H34.9

## 2011-07-28 HISTORY — DX: Unspecified osteoarthritis, unspecified site: M19.90

## 2011-07-28 HISTORY — DX: Pneumonia, unspecified organism: J18.9

## 2011-07-28 HISTORY — DX: Dependence on supplemental oxygen: Z99.81

## 2011-07-28 HISTORY — DX: Acute myocardial infarction, unspecified: I21.9

## 2011-07-28 HISTORY — DX: Malignant neoplasm of prostate: C61

## 2011-07-28 HISTORY — DX: Nonrheumatic aortic (valve) stenosis: I35.0

## 2011-07-28 HISTORY — DX: Essential (primary) hypertension: I10

## 2011-07-28 LAB — POCT I-STAT TROPONIN I: Troponin i, poc: 1 ng/mL (ref 0.00–0.08)

## 2011-07-28 LAB — BASIC METABOLIC PANEL
Calcium: 10 mg/dL (ref 8.4–10.5)
GFR calc Af Amer: 68 mL/min — ABNORMAL LOW (ref >90)
GFR calc non Af Amer: 59 mL/min — ABNORMAL LOW (ref >90)
Potassium: 4.2 mEq/L (ref 3.5–5.1)
Sodium: 132 mEq/L — ABNORMAL LOW (ref 135–145)

## 2011-07-28 LAB — CBC
Hemoglobin: 11 g/dL — ABNORMAL LOW (ref 13.0–17.0)
MCH: 29.3 pg (ref 26.0–34.0)
MCHC: 34.4 g/dL (ref 30.0–36.0)
Platelets: 143 10*3/uL — ABNORMAL LOW (ref 150–400)
RDW: 14.1 % (ref 11.5–15.5)

## 2011-07-28 LAB — PRO B NATRIURETIC PEPTIDE: Pro B Natriuretic peptide (BNP): 1788 pg/mL — ABNORMAL HIGH (ref 0–450)

## 2011-07-28 MED ORDER — HEPARIN SOD (PORCINE) IN D5W 100 UNIT/ML IV SOLN
1000.0000 [IU]/h | INTRAVENOUS | Status: DC
  Administered 2011-07-29: 1000 [IU]/h via INTRAVENOUS
  Filled 2011-07-28 (×4): qty 250

## 2011-07-28 MED ORDER — ACETAMINOPHEN 325 MG PO TABS
975.0000 mg | ORAL_TABLET | Freq: Once | ORAL | Status: AC
  Administered 2011-07-29: 975 mg via ORAL
  Filled 2011-07-28: qty 1

## 2011-07-28 MED ORDER — MORPHINE SULFATE 4 MG/ML IJ SOLN
6.0000 mg | Freq: Once | INTRAMUSCULAR | Status: AC
  Administered 2011-07-29: 4 mg via INTRAVENOUS
  Filled 2011-07-28 (×2): qty 1

## 2011-07-28 MED ORDER — SODIUM CHLORIDE 0.9 % IV SOLN
INTRAVENOUS | Status: DC
  Administered 2011-07-28: 22:00:00 via INTRAVENOUS

## 2011-07-28 MED ORDER — SODIUM CHLORIDE 0.9 % IV BOLUS (SEPSIS)
1000.0000 mL | Freq: Once | INTRAVENOUS | Status: AC
  Administered 2011-07-29: 1000 mL via INTRAVENOUS

## 2011-07-28 MED ORDER — ASPIRIN 325 MG PO TABS
325.0000 mg | ORAL_TABLET | Freq: Once | ORAL | Status: AC
  Administered 2011-07-29: 325 mg via ORAL
  Filled 2011-07-28: qty 1

## 2011-07-28 MED ORDER — HEPARIN BOLUS VIA INFUSION
4000.0000 [IU] | Freq: Once | INTRAVENOUS | Status: AC
  Administered 2011-07-29: 4000 [IU] via INTRAVENOUS
  Filled 2011-07-28: qty 4000

## 2011-07-28 NOTE — ED Provider Notes (Addendum)
History     CSN: [id number] Arrival date & time: 07/28/2011  8:55 PM   First MD Initiated Contact with Patient 07/28/11 1951      No chief complaint on file.   (Consider location/radiation/quality/duration/timing/severity/associated sxs/prior treatment) HPI Comments: Been feeling sick since Monday" feeling very week, coughing "slightly" "feeling SOB" very weak...  Patient is a 77 y.o. male presenting with shortness of breath.  Shortness of Breath  The current episode started 3 to 5 days ago. The problem has been unchanged. The problem is moderate. Associated symptoms include chest pain, cough and shortness of breath. Pertinent negatives include no wheezing. There was no intake of a foreign body. He was not exposed to toxic fumes. He has not inhaled smoke recently.    No past medical history on file.  No past surgical history on file.  No family history on file.  History  Substance Use Topics  . Smoking status: Not on file  . Smokeless tobacco: Not on file  . Alcohol Use: Not on file      Review of Systems  Respiratory: Positive for cough and shortness of breath. Negative for wheezing.   Cardiovascular: Positive for chest pain.    Allergies  Iohexol  Home Medications  No current outpatient prescriptions on file.  BP 141/56  Pulse 127  Temp(Src) 99.3 F (37.4 C) (Oral)  Resp 26  SpO2 94%  Physical Exam  Constitutional: He is oriented to person, place, and time. He appears distressed.  HENT:  Head: Normocephalic.  Mouth/Throat: Uvula is midline. Mucous membranes are pale and dry.  Eyes: Right eye exhibits no discharge.  Neck: Normal range of motion. No JVD present.  Cardiovascular:  Extrasystoles are present.  Murmur heard.  Systolic murmur is present with a grade of 5/6  Pulmonary/Chest: No respiratory distress. He has decreased breath sounds. He has no wheezes.  Neurological: He is alert and oriented to person, place, and time.  Skin: Skin is warm.  There is pallor.    ED Course  Procedures (including critical care time)  Labs Reviewed - No data to display No results found.   No diagnosis found.    MDM  Sob-subscapular pain- non-ambulatory on his own        Jimmie Molly, MD 07/28/11 2131  Jimmie Molly, MD 07/28/11 0865  Jimmie Molly, MD 07/28/11 2133

## 2011-07-28 NOTE — ED Provider Notes (Signed)
History     CSN: [id number] Arrival date & time: 07/28/2011 10:34 PM   First MD Initiated Contact with Patient 07/28/11 2248      Chief Complaint  Patient presents with  . Chest Pain  . Cough    (Consider location/radiation/quality/duration/timing/severity/associated sxs/prior treatment) Patient is a 77 y.o. male presenting with cough. The history is provided by the patient.  Cough Associated symptoms include chest pain.   the patient reports intermittent chest pain for 3 days.  He reports his chest pain as a tightness that comes across his chest with radiation of his bilateral shoulder blades.  He reports his pain comes on when he walks down the hall he says he becomes slightly sweaty and short of breath.  He reports a prior history of coronary artery disease with his last "heart attack" approximately a year and a half ago.  At that time he states he had a heart catheterization done at the [place] and they were unable to place stents secondary to what sounds like his anatomy.  He reports he has had chills without fever over the past 24 hours.  He's felt "weak".. he denies vomiting and diarrhea.  He denies melena hematochezia.  He denies palpitations.  He also reports nonproductive cough.  Currently he is with 5/10 chest pain.  His symptoms are worsened by exertion.  Improved by rest.  Currently his pain is 5 at 10. he denies dysuria.  He denies new rash  Past Medical History  Diagnosis Date  . Diabetes mellitus   . MI (myocardial infarction)   . Aortic stenosis   . High cholesterol   . COPD (chronic obstructive pulmonary disease)   . Stroke   . Retinal artery occlusion   . Emphysema   . GERD (gastroesophageal reflux disease)   . Coronary artery disease   . Heart murmur   . Prostate cancer   . On home oxygen therapy     Past Surgical History  Procedure Date  . Rotator cuff repair   . Other surgical history     Bilateral leg stents    No family history on file.  History    Substance Use Topics  . Smoking status: Former Games developer  . Smokeless tobacco: Not on file  . Alcohol Use: No      Review of Systems  Respiratory: Positive for cough.   Cardiovascular: Positive for chest pain.  All other systems reviewed and are negative.    Allergies  Iodine and Iohexol  Home Medications   Current Outpatient Rx  Name Route Sig Dispense Refill  . ALBUTEROL SULFATE HFA 108 (90 BASE) MCG/ACT IN AERS Inhalation Inhale 2 puffs into the lungs every 6 (six) hours as needed. For shortness of breath     . ASPIRIN 81 MG PO TABS Oral Take 81 mg by mouth daily.      . ASPIRIN-DIPYRIDAMOLE 25-200 MG PO CP12 Oral Take 1 capsule by mouth 2 (two) times daily.      Marland Kitchen GABAPENTIN 300 MG PO CAPS Oral Take 300 mg by mouth at bedtime.      Marland Kitchen HYDROCODONE-ACETAMINOPHEN PO Oral Take 1 tablet by mouth every 6 (six) hours as needed. For pain    . METFORMIN HCL 850 MG PO TABS Oral Take 850 mg by mouth 2 (two) times daily with a meal.     . METOPROLOL TARTRATE 25 MG PO TABS Oral Take 25 mg by mouth 2 (two) times daily.      . MOMETASONE  FUROATE 220 MCG/INH IN AEPB Inhalation Inhale 2 puffs into the lungs daily.      Marland Kitchen NITROGLYCERIN 0.6 MG/HR TD PT24 Transdermal Place 1 patch onto the skin daily.      Marland Kitchen OMEPRAZOLE 40 MG PO CPDR Oral Take 40 mg by mouth 2 (two) times daily. 2 tabs at breakfast and dinner     . RANOLAZINE 500 MG PO TB12 Oral Take 500 mg by mouth 2 (two) times daily.      Marland Kitchen TERAZOSIN HCL 5 MG PO CAPS Oral Take 5 mg by mouth 2 (two) times daily.      Marland Kitchen TIOTROPIUM BROMIDE MONOHYDRATE 18 MCG IN CAPS Inhalation Place 18 mcg into inhaler and inhale daily.      . TRAVOPROST (BAK FREE) 0.004 % OP SOLN Both Eyes Place 1 drop into both eyes at bedtime.        BP 152/67  Pulse 114  Resp 18  SpO2 97%  Physical Exam  Nursing note and vitals reviewed. Constitutional: He is oriented to person, place, and time. He appears well-developed and well-nourished.  HENT:  Head: Normocephalic  and atraumatic.  Eyes: EOM are normal.  Neck: Normal range of motion.  Cardiovascular: Normal rate, normal heart sounds and intact distal pulses.        Tachycardic  Pulmonary/Chest: Effort normal and breath sounds normal. No respiratory distress.  Abdominal: Soft. He exhibits no distension. There is no tenderness.  Musculoskeletal: Normal range of motion.  Neurological: He is alert and oriented to person, place, and time.  Skin: Skin is warm and dry.  Psychiatric: He has a normal mood and affect. Judgment normal.    ED Course  Procedures (including critical care time)  CRITICAL CARE Performed by: Lyanne Co   Total critical care time: 35  Critical care time was exclusive of separately billable procedures and treating other patients.  Critical care was necessary to treat or prevent imminent or life-threatening deterioration.  Critical care was time spent personally by me on the following activities: development of treatment plan with patient and/or surrogate as well as nursing, discussions with consultants, evaluation of patient's response to treatment, examination of patient, obtaining history from patient or surrogate, ordering and performing treatments and interventions, ordering and review of laboratory studies, ordering and review of radiographic studies, pulse oximetry and re-evaluation of patient's condition.   Labs Reviewed  CBC - Abnormal; Notable for the following:    RBC 3.76 (*)    Hemoglobin 11.0 (*)    HCT 32.0 (*)    Platelets 143 (*)    All other components within normal limits  BASIC METABOLIC PANEL - Abnormal; Notable for the following:    Sodium 132 (*)    Chloride 95 (*)    Glucose, Bld 207 (*)    GFR calc non Af Amer 59 (*)    GFR calc Af Amer 68 (*)    All other components within normal limits  PRO B NATRIURETIC PEPTIDE - Abnormal; Notable for the following:    BNP, POC 1788.0 (*)    All other components within normal limits  POCT I-STAT  TROPONIN I - Abnormal; Notable for the following:    Troponin i, poc 1.00 (*)    All other components within normal limits  POCT CARDIAC MARKERS  TROPONIN I  CK TOTAL AND CKMB  CULTURE, BLOOD (ROUTINE X 2)  CULTURE, BLOOD (ROUTINE X 2)  LACTIC ACID, PLASMA   Dg Chest 2 View  07/28/2011  *RADIOLOGY REPORT*  Clinical Data: Cough and shortness breath.  CHEST - 2 VIEW  Comparison: 09/29/2010  Findings: Stable COPD.  No acute infiltrate, edema or pleural effusion identified.  The heart size is stable. Stable degenerative changes of the thoracic spine.  IMPRESSION: Stable COPD.  No acute findings.  Original Report Authenticated By: Reola Calkins, M.D.     1. Fever   2. NSTEMI (non-ST elevated myocardial infarction)       MDM  Likely supply and demand non-ST elevation MI.  EKG without ST changes.  His troponin is 1.  Rectal temp pending at this time given his oral temp of 99.3 his pulse rate of 127.  This is a sinus tachycardia.  At this time there is no clear etiology for his infectious symptom. chest x-ray pending.  Morphine given for pain.  Aspirin started as well as a heparin drip.  At this time there is no history of melena or hematochezia and therefore heparin is her best option for anticoagulations such that he can be turned off if things change or worsen.  At this time given my concern for Sirs/sepsis we'll hold nitro drip as I don't want to drop his blood pressure and complicate his care.  Hopefully by IV fluids and improvement in his infectious symptoms his chest pain should resolve  1:20 AM Spoke with [facility] cardiology who will provide consultation. [facility] to admit        Lyanne Co, MD 07/29/11 [phone]

## 2011-07-28 NOTE — ED Notes (Signed)
EKG applied to Pt per MD Coll

## 2011-07-28 NOTE — ED Notes (Signed)
O2 applied at 2 liters per nasal cannula, EKG shows S Tach in 120's.

## 2011-07-28 NOTE — ED Notes (Signed)
Pt was transferred from [facility] with c/o cough for several days and CP. Pt has had SOB and a general weak feeling per daughter while at [facility].

## 2011-07-28 NOTE — ED Notes (Signed)
Pt c/o fever, SOB, chest pain and non-productive cough since Monday.  States pain is all the way across his chest and under his shoulder blades.  Describes pain as aching.  Denies n/v.  States had some sweating 2 days ago.

## 2011-07-28 NOTE — ED Notes (Signed)
Troponin results reported to Dr. Patria Mane by B. Bing Plume, EMT

## 2011-07-28 NOTE — Progress Notes (Signed)
ANTICOAGULATION CONSULT NOTE - Initial Consult  Pharmacy Consult for heparin Indication: chest pain/ACS  Allergies  Allergen Reactions  . Iodine Shortness Of Breath  . Iohexol      Code: SOB, Desc: ivp dye, iodine, Onset Date: [id number]    Patient Measurements: Height: 5\' 9"  (175.3 cm) Weight: 165 lb 5.5 oz (75 kg) (Entered from prior admission and should be reviewed) IBW/kg (Calculated) : 70.7   Vital Signs: Temp: 99.3 F (37.4 C) (11/28 2058) Temp src: Oral (11/28 2058) BP: 152/67 mmHg (11/28 2320) Pulse Rate: 114  (11/28 2320)  Labs:  [facility] 07/28/11 2254  HGB 11.0*  HCT 32.0*  PLT 143*  APTT --  LABPROT --  INR --  HEPARINUNFRC --  CREATININE 1.13  CKTOTAL --  CKMB --  TROPONINI --   Estimated Creatinine Clearance: 51.3 ml/min (by C-G formula based on Cr of 1.13).  Medical History: Past Medical History  Diagnosis Date  . Diabetes mellitus   . MI (myocardial infarction)   . Aortic stenosis   . High cholesterol   . COPD (chronic obstructive pulmonary disease)   . Stroke   . Retinal artery occlusion   . Emphysema   . GERD (gastroesophageal reflux disease)   . Coronary artery disease   . Heart murmur   . Prostate cancer   . On home oxygen therapy    Medications:  Pending  Assessment: 77yo male c/o CP across chest and shoulder blades associated with SOB and diaphoresis, elevated troponin on i-Stat w/ initial CE pending, to begin heparin.  Goal of Therapy:  Heparin level 0.3-0.7 units/ml   Plan:  Will give heparin bolus of 4000 units followed by gtt at 1000 units/hr and monitor heparin levels and CBC.  Colleen Can PharmD BCPS 07/28/2011,11:52 PM

## 2011-07-29 ENCOUNTER — Other Ambulatory Visit: Payer: Self-pay

## 2011-07-29 ENCOUNTER — Encounter (HOSPITAL_COMMUNITY): Payer: Self-pay | Admitting: Internal Medicine

## 2011-07-29 LAB — URINALYSIS, ROUTINE W REFLEX MICROSCOPIC
Glucose, UA: NEGATIVE mg/dL
Leukocytes, UA: NEGATIVE
Nitrite: NEGATIVE
Protein, ur: 30 mg/dL — AB
Urobilinogen, UA: 1 mg/dL (ref 0.0–1.0)

## 2011-07-29 LAB — CBC
Hemoglobin: 11.6 g/dL — ABNORMAL LOW (ref 13.0–17.0)
MCH: 29.2 pg (ref 26.0–34.0)
MCV: 86.1 fL (ref 78.0–100.0)
RBC: 3.97 MIL/uL — ABNORMAL LOW (ref 4.22–5.81)
WBC: 5.8 10*3/uL (ref 4.0–10.5)

## 2011-07-29 LAB — INFLUENZA PANEL BY PCR (TYPE A & B)
H1N1 flu by pcr: NOT DETECTED
Influenza A By PCR: POSITIVE — AB
Influenza B By PCR: NEGATIVE

## 2011-07-29 LAB — CARDIAC PANEL(CRET KIN+CKTOT+MB+TROPI)
CK, MB: 5.2 ng/mL — ABNORMAL HIGH (ref 0.3–4.0)
Total CK: 330 U/L — ABNORMAL HIGH (ref 7–232)
Troponin I: 1.58 ng/mL (ref <0.30)

## 2011-07-29 LAB — GLUCOSE, CAPILLARY
Glucose-Capillary: 147 mg/dL — ABNORMAL HIGH (ref 70–99)
Glucose-Capillary: 167 mg/dL — ABNORMAL HIGH (ref 70–99)
Glucose-Capillary: 228 mg/dL — ABNORMAL HIGH (ref 70–99)

## 2011-07-29 LAB — URINE MICROSCOPIC-ADD ON

## 2011-07-29 LAB — MRSA PCR SCREENING: MRSA by PCR: POSITIVE — AB

## 2011-07-29 LAB — BASIC METABOLIC PANEL
CO2: 28 mEq/L (ref 19–32)
Calcium: 9.7 mg/dL (ref 8.4–10.5)
Chloride: 96 mEq/L (ref 96–112)
Glucose, Bld: 158 mg/dL — ABNORMAL HIGH (ref 70–99)
Sodium: 135 mEq/L (ref 135–145)

## 2011-07-29 LAB — CK TOTAL AND CKMB (NOT AT ARMC): Total CK: 215 U/L (ref 7–232)

## 2011-07-29 MED ORDER — PIPERACILLIN-TAZOBACTAM 3.375 G IVPB
3.3750 g | Freq: Once | INTRAVENOUS | Status: AC
  Administered 2011-07-29: 3.375 g via INTRAVENOUS
  Filled 2011-07-29: qty 50

## 2011-07-29 MED ORDER — ALBUTEROL SULFATE (5 MG/ML) 0.5% IN NEBU
2.5000 mg | INHALATION_SOLUTION | RESPIRATORY_TRACT | Status: DC | PRN
  Administered 2011-07-29: 2.5 mg via RESPIRATORY_TRACT
  Filled 2011-07-29: qty 0.5

## 2011-07-29 MED ORDER — PREDNISONE 50 MG PO TABS
50.0000 mg | ORAL_TABLET | Freq: Every day | ORAL | Status: DC
  Administered 2011-07-29: 50 mg via ORAL
  Filled 2011-07-29 (×2): qty 1

## 2011-07-29 MED ORDER — RANOLAZINE ER 500 MG PO TB12
500.0000 mg | ORAL_TABLET | Freq: Two times a day (BID) | ORAL | Status: DC
  Administered 2011-07-29 – 2011-07-31 (×5): 500 mg via ORAL
  Filled 2011-07-29 (×6): qty 1

## 2011-07-29 MED ORDER — ISOSORBIDE MONONITRATE ER 30 MG PO TB24
30.0000 mg | ORAL_TABLET | Freq: Every day | ORAL | Status: DC
  Administered 2011-07-29: 30 mg via ORAL
  Filled 2011-07-29 (×2): qty 1

## 2011-07-29 MED ORDER — VANCOMYCIN HCL IN DEXTROSE 1-5 GM/200ML-% IV SOLN
1000.0000 mg | Freq: Once | INTRAVENOUS | Status: AC
  Administered 2011-07-29: 1000 mg via INTRAVENOUS
  Filled 2011-07-29: qty 200

## 2011-07-29 MED ORDER — METOPROLOL TARTRATE 25 MG PO TABS
25.0000 mg | ORAL_TABLET | Freq: Two times a day (BID) | ORAL | Status: DC
  Administered 2011-07-29 – 2011-07-31 (×5): 25 mg via ORAL
  Filled 2011-07-29 (×6): qty 1

## 2011-07-29 MED ORDER — TIOTROPIUM BROMIDE MONOHYDRATE 18 MCG IN CAPS
18.0000 ug | ORAL_CAPSULE | Freq: Every day | RESPIRATORY_TRACT | Status: DC
  Filled 2011-07-29 (×2): qty 5

## 2011-07-29 MED ORDER — FLUTICASONE PROPIONATE HFA 44 MCG/ACT IN AERO
2.0000 | INHALATION_SPRAY | Freq: Two times a day (BID) | RESPIRATORY_TRACT | Status: DC
  Administered 2011-07-29 – 2011-07-31 (×3): 2 via RESPIRATORY_TRACT
  Filled 2011-07-29 (×2): qty 10.6

## 2011-07-29 MED ORDER — TERAZOSIN HCL 5 MG PO CAPS
5.0000 mg | ORAL_CAPSULE | Freq: Two times a day (BID) | ORAL | Status: DC
  Administered 2011-07-29 – 2011-07-31 (×5): 5 mg via ORAL
  Filled 2011-07-29 (×6): qty 1

## 2011-07-29 MED ORDER — CHLORHEXIDINE GLUCONATE CLOTH 2 % EX PADS
6.0000 | MEDICATED_PAD | Freq: Every day | CUTANEOUS | Status: DC
  Administered 2011-07-30 – 2011-07-31 (×2): 6 via TOPICAL

## 2011-07-29 MED ORDER — PANTOPRAZOLE SODIUM 40 MG PO TBEC
80.0000 mg | DELAYED_RELEASE_TABLET | Freq: Every day | ORAL | Status: DC
  Administered 2011-07-29 – 2011-07-30 (×2): 80 mg via ORAL
  Filled 2011-07-29 (×2): qty 1
  Filled 2011-07-29: qty 2

## 2011-07-29 MED ORDER — GABAPENTIN 300 MG PO CAPS
300.0000 mg | ORAL_CAPSULE | Freq: Every day | ORAL | Status: DC
  Administered 2011-07-29 – 2011-07-30 (×2): 300 mg via ORAL
  Filled 2011-07-29 (×3): qty 1

## 2011-07-29 MED ORDER — ACETAMINOPHEN 325 MG PO TABS
650.0000 mg | ORAL_TABLET | Freq: Four times a day (QID) | ORAL | Status: DC | PRN
  Administered 2011-07-29: 650 mg via ORAL
  Filled 2011-07-29: qty 2

## 2011-07-29 MED ORDER — NITROGLYCERIN 0.6 MG/HR TD PT24
0.6000 mg | MEDICATED_PATCH | Freq: Every day | TRANSDERMAL | Status: DC
  Administered 2011-07-29 – 2011-07-31 (×3): 0.6 mg via TRANSDERMAL
  Filled 2011-07-29 (×3): qty 1

## 2011-07-29 MED ORDER — HYDROCODONE-ACETAMINOPHEN 5-325 MG PO TABS: 1.0000 | ORAL_TABLET | Freq: Four times a day (QID) | ORAL | Status: DC | PRN

## 2011-07-29 MED ORDER — SODIUM CHLORIDE 0.9 % IV SOLN
INTRAVENOUS | Status: AC
  Administered 2011-07-29: 13:00:00 via INTRAVENOUS

## 2011-07-29 MED ORDER — INSULIN ASPART 100 UNIT/ML ~~LOC~~ SOLN
0.0000 [IU] | Freq: Three times a day (TID) | SUBCUTANEOUS | Status: DC
  Administered 2011-07-29: 1 [IU] via SUBCUTANEOUS
  Administered 2011-07-29: 3 [IU] via SUBCUTANEOUS
  Administered 2011-07-30 (×2): 1 [IU] via SUBCUTANEOUS
  Filled 2011-07-29 (×2): qty 3

## 2011-07-29 MED ORDER — OSELTAMIVIR PHOSPHATE 75 MG PO CAPS
75.0000 mg | ORAL_CAPSULE | Freq: Two times a day (BID) | ORAL | Status: DC
  Administered 2011-07-29 – 2011-07-31 (×4): 75 mg via ORAL
  Filled 2011-07-29 (×6): qty 1

## 2011-07-29 MED ORDER — SODIUM CHLORIDE 0.9 % IV SOLN
INTRAVENOUS | Status: DC
  Administered 2011-07-29 – 2011-07-30 (×3): via INTRAVENOUS

## 2011-07-29 MED ORDER — IPRATROPIUM BROMIDE 0.02 % IN SOLN
0.5000 mg | RESPIRATORY_TRACT | Status: DC
  Administered 2011-07-29: 0.5 mg via RESPIRATORY_TRACT
  Filled 2011-07-29: qty 2.5

## 2011-07-29 MED ORDER — IPRATROPIUM BROMIDE 0.02 % IN SOLN
0.5000 mg | RESPIRATORY_TRACT | Status: DC | PRN
  Administered 2011-07-29: 0.5 mg via RESPIRATORY_TRACT
  Filled 2011-07-29: qty 2.5

## 2011-07-29 MED ORDER — MUPIROCIN 2 % EX OINT
1.0000 "application " | TOPICAL_OINTMENT | Freq: Two times a day (BID) | CUTANEOUS | Status: DC
  Administered 2011-07-29 – 2011-07-31 (×5): 1 via NASAL
  Filled 2011-07-29: qty 22

## 2011-07-29 MED ORDER — ALBUTEROL SULFATE (5 MG/ML) 0.5% IN NEBU
2.5000 mg | INHALATION_SOLUTION | RESPIRATORY_TRACT | Status: DC
  Administered 2011-07-29: 2.5 mg via RESPIRATORY_TRACT
  Filled 2011-07-29: qty 0.5

## 2011-07-29 MED ORDER — TRAVOPROST (BAK FREE) 0.004 % OP SOLN
1.0000 [drp] | Freq: Every day | OPHTHALMIC | Status: DC
  Administered 2011-07-29 – 2011-07-30 (×2): 1 [drp] via OPHTHALMIC
  Filled 2011-07-29 (×2): qty 2.5

## 2011-07-29 MED ORDER — ACETAMINOPHEN 650 MG RE SUPP: 650.0000 mg | Freq: Four times a day (QID) | RECTAL | Status: DC | PRN

## 2011-07-29 MED ORDER — FUROSEMIDE 10 MG/ML IJ SOLN
40.0000 mg | Freq: Once | INTRAMUSCULAR | Status: DC
  Filled 2011-07-29: qty 4

## 2011-07-29 MED ORDER — DOXYCYCLINE HYCLATE 100 MG PO TABS
100.0000 mg | ORAL_TABLET | Freq: Two times a day (BID) | ORAL | Status: DC
  Administered 2011-07-29: 100 mg via ORAL
  Filled 2011-07-29 (×2): qty 1

## 2011-07-29 MED ORDER — ALBUTEROL SULFATE HFA 108 (90 BASE) MCG/ACT IN AERS
2.0000 | INHALATION_SPRAY | Freq: Four times a day (QID) | RESPIRATORY_TRACT | Status: DC | PRN
  Filled 2011-07-29 (×2): qty 6.7

## 2011-07-29 MED ORDER — ASPIRIN-DIPYRIDAMOLE ER 25-200 MG PO CP12
1.0000 | ORAL_CAPSULE | Freq: Two times a day (BID) | ORAL | Status: DC
  Administered 2011-07-29 – 2011-07-31 (×5): 1 via ORAL
  Filled 2011-07-29 (×6): qty 1

## 2011-07-29 NOTE — Progress Notes (Signed)
ANTICOAGULATION CONSULT NOTE - Follow Up Consult  Pharmacy Consult for heparin Indication: chest pain/ACS  Labs:  Basename 07/29/11 0907 07/29/11 0227 07/28/11 2318 07/28/11 2254  HGB 11.6* -- -- 11.0*  HCT 34.2* -- -- 32.0*  PLT 163 -- -- 143*  APTT -- -- -- --  LABPROT -- -- -- --  INR -- -- -- --  HEPARINUNFRC 0.43 -- -- --  CREATININE -- -- -- 1.13  CKTOTAL -- 202 215 --  CKMB -- 5.1* 5.6* --  TROPONINI -- 1.58* 1.34* --   Estimated Creatinine Clearance: 51.3 ml/min (by C-G formula based on Cr of 1.13).  Assessment: Heparin level 0.43 after 4000 unit bolus and 1000 unit/hr. Level therapeutic. No bleeding reported. Goal of Therapy:  Heparin level 0.3-0.7 units/ml   Plan:  Continue heparin at 1000 units/hr. Daily HL and CBC while on heparin.  Len Childs T 07/29/2011,10:15 AM

## 2011-07-29 NOTE — Progress Notes (Signed)
PGY-1 Progress Note  Patient was seen earlier in the day to discuss code status. At that time, he was very tired and unable to state his wishes although he did come with a travel DNR.   Tonight, his daughter was in the room so I returned to discuss code status again. Patient clearly stated that he would want CPR as well as drugs, but he does NOT want to be intubated with mechanical ventilation, or defibrillation.  His daughter was present to witness this and agrees.  Limited code order was placed.   Malayla Granberry M. Shatavia Santor, M.D.

## 2011-07-29 NOTE — Progress Notes (Signed)
Clinical Update Note Patient's influenza came back positive for influenza A  Plan -will start tamiflu -will stop treatment for COPD exacerbation given normal CXR and explanation for fever/cough with influenza positive.  [name], Eugene Bauer 07/29/2011, 11:05 AM

## 2011-07-29 NOTE — ED Notes (Signed)
Spoke with phlebotomy regarding blood orders - was informed that the blood was drawn.

## 2011-07-29 NOTE — Progress Notes (Signed)
  Echocardiogram 2D Echocardiogram has been performed.  Eugene Bauer 07/29/2011, 5:14 PM

## 2011-07-29 NOTE — H&P (Signed)
FMT[facility] Attending Daily Note: Sara Neal MD [phone] pager office [phone] I  have seen and examined this patient, reviewed their chart. I have discussed this patient with the resident. I agree with the resident's findings, assessment and care plan. 

## 2011-07-29 NOTE — ED Notes (Signed)
Received report from Skeet Latch, RN. Report has already been phoned to unit. Pt AAOx3, resp e/u, nad. Will transport pt upstairs shortly.

## 2011-07-29 NOTE — Progress Notes (Signed)
Subjective:  77 year old male with known coronary artery disease with unsuccessful attempt at angioplasty of unknown vessel, possibly RCA, at [facility] in may of 2011 with chronic CCS 2-3 angina on her next who presented with shortness of breath, exertional angina, chest pain with mildly elevated troponin, fever, body aches who received FluMist 7 days ago. Tachycardia was noted on original exam.  Objective:  Vital Signs in the last 24 hours: Temp:  [97.8 F (36.6 C)-101.4 F (38.6 C)] 97.8 F (36.6 C) (11/29 0850) Pulse Rate:  [84-127] 117  (11/29 0850) Resp:  [18-26] 19  (11/29 0850) BP: (131-156)/(47-98) 153/98 mmHg (11/29 0850) SpO2:  [94 %-97 %] 96 % (11/29 0850) Weight:  [75 kg (165 lb 5.5 oz)-79.8 kg (175 lb 14.8 oz)] 175 lb 14.8 oz (79.8 kg) (11/29 0430)  Intake/Output from previous day: 11/28 0701 - 11/29 0700 In: -  Out: 200 [Urine:200] Intake/Output from this shift: Total I/O In: -  Out: 150 [Urine:150]  Physical Exam: GENERAL: no acute distress.  EYES: Extra ocular movements are intact. There is no lid lag. Sclera is anicteric.  ENT: Oropharynx is clear. Dentition is within normal limits.  NECK: Supple. The thyroid is not enlarged.  LYMPH: There are no masses or lymphadenopathy present.  HEART: Regular rate and rhythm with 2/6 SEM and 2/6 holosystolic murmur at apex, JVP 10cm  LUNGS: bilateral diffuse wheezing throughout  ABDOMEN: Soft, non-tender, and non-distended with normoactive bowel sounds. There is no hepatosplenomegaly.  EXTREMITIES: No clubbing, cyanosis, or edema.  PULSES: pulses were +2 and equal bilaterally.  SKIN: Warm, dry, and intact.  NEUROLOGIC: The patient was oriented to person, place, and time. No overt neurologic deficits were detected.  PSYCH: Normal judgment and insight, mood is appropriate.    Lab Results:  [facility] 07/29/11 0907 07/28/11 2254  WBC 5.8 5.6  HGB 11.6* 11.0*  PLT 163 143*    Basename 07/29/11 0907 07/28/11 2254  NA 135  132*  K 3.6 4.2  CL 96 95*  CO2 28 28  GLUCOSE 158* 207*  BUN 13 14  CREATININE 1.02 1.13    Basename 07/29/11 0227 07/28/11 2318  TROPONINI 1.58* 1.34*   Hepatic Function Panel  No results found for this basename: CHOL in the last 72 hours No results found for this basename: PROTIME in the last 72 hours  Imaging: Stable COPD. No acute findings.  Cardiac Studies: Telemetry with sinus tachycardia, frequent PVCs, heart rate ranging from 100-125. EKG personally reviewed from earlier this morning shows sinus rhythm rate 94 with no other abnormalities. Old anteroseptal infarct. No ST segment changes.  Assessment/Plan:  Chest pain/NSTEMI - patient with chronic stable angina on multidrug regimen. Failed attempted PCI in May, [place]. Mildly elevated troponin. In part, history point elevation is likely secondary to demand ischemia in the setting of COPD, fever as well as underlying CAD. For now, I would continue with aggressive medical management. I would not pursue cardiac catheterization at this time given prior attempt. Note from earlier this morning was reviewed.  Echocardiogram. I will add low-dose isosorbide.  Aortic stenosis- await echocardiogram  Fever - antibiotics per primary team, possible influenza/bronchitis  COPD - aggressive care per primary team.  LOS: 1 day    SKAINS, MARK 07/29/2011, 10:46 AM

## 2011-07-29 NOTE — H&P (Signed)
Family Medicine Teaching [facility] Admission History and Physical  Patient name: Eugene Bauer Medical record number: [id number] Date of birth: Dec 12, 1926 Age: 77 y.o. Gender: male  Primary Care Provider: [facility]  Chief Complaint: chest pain History of Present Illness: Eugene Bauer is a 77 y.o. year old male with extensive cardiovascular history and COPD presenting with chest pain for the last several months, worsening over the last several days.  The chest pain is located in the bilateral chest and beneath the shoulder blades.  It seldom radiates to his left arm.  At baseline, the chest pain occurs with walking to the dining room (about 360ft) and with breathing hard; it is relieved with rest.  Now he is experiencing chest pain at shorter distances.  He does get lightheaded with these episodes.  Denies any associated nausea or diaphoresis.  He also complains of a dry cough that has been getting worse over the last several days.  No increased sputum production, but has been wheezing and using his albuterol inhaler more frequently.  Has had some chills and cold sweats over the last several days as well as body aches.  Received flu mist about 1 week ago.  Denies any dyspnea, peripheral edema, abdominal pain, n/v/d, melena, hematochezia.  In the ED, he received a 1L NS bolus, vanc, zosyn, and was started on a heparin drip and aspirin.  Cardiology was consulted.  There is no problem list on file for this patient.  Past Medical History: Past Medical History  Diagnosis Date  . Diabetes mellitus   . MI (myocardial infarction)   . Aortic stenosis   . High cholesterol   . COPD (chronic obstructive pulmonary disease)   . Stroke   . Retinal artery occlusion   . Emphysema   . GERD (gastroesophageal reflux disease)   . Coronary artery disease   . Heart murmur   . Prostate cancer   . On home oxygen therapy     Past Surgical History: Past Surgical History  Procedure Date  . Rotator  cuff repair   . Other surgical history     Bilateral leg stents    Social History: History   Social History  . Marital Status: Single    Spouse Name: N/A    Number of Children: N/A  . Years of Education: N/A   Social History Main Topics  . Smoking status: Former Smoker    Quit date: 08/30/1968  . Smokeless tobacco: None  . Alcohol Use: No  . Drug Use: No  . Sexually Active: None   Other Topics Concern  . None   Social History Narrative  . None    Family History: Family History  Problem Relation Age of Onset  . Coronary artery disease Brother 23  . Coronary artery disease Sister     Allergies: Allergies  Allergen Reactions  . Iodine Shortness Of Breath  . Iohexol      Code: SOB, Desc: ivp dye, iodine, Onset Date: [id number]     Current Facility-Administered Medications  Medication Dose Route Frequency Provider Last Rate Last Dose  . acetaminophen (TYLENOL) tablet 975 mg  975 mg Oral Once Lyanne Co, MD   975 mg at 07/29/11 0038  . aspirin tablet 325 mg  325 mg Oral Once Lyanne Co, MD   325 mg at 07/29/11 0036  . heparin 100 units/mL bolus via infusion 4,000 Units  4,000 Units Intravenous Once Colleen Can, PHARMD   4,000 Units at 07/29/11 0053  .  heparin ADULT infusion 100 units/ml (25000 units/250 ml)  1,000 Units/hr Intravenous Continuous Colleen Can, PHARMD 10 mL/hr at 07/29/11 0115 1,000 Units/hr at 07/29/11 0115  . morphine 4 MG/ML injection 6 mg  6 mg Intravenous Once Lyanne Co, MD   4 mg at 07/29/11 0037  . piperacillin-tazobactam (ZOSYN) IVPB 3.375 g  3.375 g Intravenous Once Lyanne Co, MD   3.375 g at 07/29/11 0113  . sodium chloride 0.9 % bolus 1,000 mL  1,000 mL Intravenous Once Lyanne Co, MD   1,000 mL at 07/29/11 0037  . vancomycin (VANCOCIN) IVPB 1000 mg/200 mL premix  1,000 mg Intravenous Once Lyanne Co, MD   1,000 mg at 07/29/11 0125  . DISCONTD: furosemide (LASIX) injection 40 mg  40 mg Intravenous Once  Meridee Score       Current Outpatient Prescriptions  Medication Sig Dispense Refill  . albuterol (PROVENTIL HFA;VENTOLIN HFA) 108 (90 BASE) MCG/ACT inhaler Inhale 2 puffs into the lungs every 6 (six) hours as needed. For shortness of breath       . aspirin 81 MG tablet Take 81 mg by mouth daily.        Marland Kitchen dipyridamole-aspirin (AGGRENOX) 25-200 MG per 12 hr capsule Take 1 capsule by mouth 2 (two) times daily.        Marland Kitchen gabapentin (NEURONTIN) 300 MG capsule Take 300 mg by mouth at bedtime.        Marland Kitchen HYDROCODONE-ACETAMINOPHEN PO Take 1 tablet by mouth every 6 (six) hours as needed. For pain      . metFORMIN (GLUCOPHAGE) 850 MG tablet Take 850 mg by mouth 2 (two) times daily with a meal.       . metoprolol tartrate (LOPRESSOR) 25 MG tablet Take 25 mg by mouth 2 (two) times daily.        . mometasone (ASMANEX) 220 MCG/INH inhaler Inhale 2 puffs into the lungs daily.        . nitroGLYCERIN (NITRODUR - DOSED IN MG/24 HR) 0.6 mg/hr Place 1 patch onto the skin daily.        Marland Kitchen omeprazole (PRILOSEC) 40 MG capsule Take 40 mg by mouth 2 (two) times daily. 2 tabs at breakfast and dinner       . ranolazine (RANEXA) 500 MG 12 hr tablet Take 500 mg by mouth 2 (two) times daily.        Marland Kitchen terazosin (HYTRIN) 5 MG capsule Take 5 mg by mouth 2 (two) times daily.        Marland Kitchen tiotropium (SPIRIVA) 18 MCG inhalation capsule Place 18 mcg into inhaler and inhale daily.        . Travoprost, BAK Free, (TRAVATAN) 0.004 % SOLN ophthalmic solution Place 1 drop into both eyes at bedtime.         Facility-Administered Medications Ordered in Other Encounters  Medication Dose Route Frequency Provider Last Rate Last Dose  . DISCONTD: 0.9 %  sodium chloride infusion   Intravenous Continuous Jimmie Molly, MD 20 mL/hr at 07/28/11 2158     Review Of Systems: Per HPI with the following additions: none Otherwise 12 point review of systems was performed and was unremarkable.  Physical Exam: Pulse: 103  Blood Pressure: 131/47 RR: 20   O2:  96% on 2L Temp: 101.4  General: alert, cooperative, appears stated age, no distress and nasal cannula in place HEENT: PERRLA, extra ocular movement intact, sclera clear, anicteric, oropharynx clear, no lesions and dry mucous membranes Heart: 3/6 SEM is  heard throughout the precordium, S1 and S2 normal, tachycardic, but regular rhythm Lungs: unlabored breathing, expiratory wheezes and no rales or rhonchi Abdomen: abdomen is soft without significant tenderness, masses, organomegaly or guarding; distended Extremities: extremities normal, atraumatic, no cyanosis or edema Skin:no rashes Neurology: normal without focal findings, mental status, speech normal, alert and oriented x3, PERLA and muscle tone and strength normal and symmetric  Labs and Imaging: Lab Results  Component Value Date/Time   NA 132* 07/28/2011 10:54 PM   K 4.2 07/28/2011 10:54 PM   CL 95* 07/28/2011 10:54 PM   CO2 28 07/28/2011 10:54 PM   BUN 14 07/28/2011 10:54 PM   CREATININE 1.13 07/28/2011 10:54 PM   GLUCOSE 207* 07/28/2011 10:54 PM   Lab Results  Component Value Date   WBC 5.6 07/28/2011   HGB 11.0* 07/28/2011   HCT 32.0* 07/28/2011   MCV 85.1 07/28/2011   PLT 143* 07/28/2011    Lab 07/28/11 2318  CKTOTAL 215  TROPONINI 1.34*  TROPONINT --  CKMBINDEX --   Lactic Acid: 1.1 BNP: 1788 UA: +ketones, +protein, spec grav unremarkable  UCx: pending BCx: pending  CXR: Stable COPD. No acute findings.  Assessment and Plan: Eugene Bauer is a 77 y.o. year old male with extensive cardiovascular history and COPD presenting with chest pain and worsening cough.  Chest Pain: This is likely demand ischemia in setting of acute infection; however, given extensive cardiac history, will rule out ACS.  Initial troponin was elevated at 1.34 with an unchanged ECG.  Will continue home anti-anginal meds of metoprolol, aggrenox, nitro patch, and ranolazine. -appreciate cardiology recommendations -continue heparin drip until  ACS ruled out -monitor troponins q8hrs -EKG in the morning -Echo in the morning  Cough and wheezing: COPD exacerbation vs flu.  With history of body aches, fever and dry cough, flu is possible, even with recent vaccination.  COPD exacerbation is also possible with increased cough and albuterol use.  No evidence for pneumonia on chest x-ray or exam.  Has had blood cultures drawn in the ED.  S/p vanc and zosyn in the ED.  Influenza panel obtained in the ED.  Will treat as COPD exacerbation for now.  Will hold home COPD inhalers. -albuterol nebs q4/q2 -atrovent nebs q4/q2 -Doxycyline 100mg  PO BID -prednisone 50mg  PO daily x5 days -will start TamiFlu is influenza positive  Fever: This is likely secondary to respiratory infection.  No evidence for UTI or other source of infection on history and physical.  White count is normal.  Urine and blood cultures are pending. -monitor fever curve and WBC  Diabetes, type II: Last HbA1c was in 09/2010 was 6.8.  Will recheck a HbA1c.  Will hold home metformin and start sensitive SSI given acute presentation.  Hx of cardiovascular disease: Continue home medications as above.  COPD: Likely in acute exacerbation; will treat as above.  GERD: Stable; will give protonix while in hospital.  FEN/GI: cardiac diet; NS 50cc/hr until 12p then saline lock Prophylaxis: on heparin drip Disposition: pending improvement of respiratory symptoms; will plan on discharge back to assisted living facility  [name], ERIN 07/29/2011, 3:18 AM  History of present illness- Patient is an 77 year old male with past medical history of CAD, COPD, CVA, prostate cancer status post XRT, PVD with, aortic stenosis, diabetes who presents with several days of increasing chest pain as well as increasing cough. He notes that he usually has trouble walking to the dining hall at his assisted living home and usually has to stop because  of chest pain. Over the last 2 or 3 days this chest pain has  been worse and he can walk less distance. He describes the chest pain is bilateral in his chest and bilaterally behind his shoulders. On Monday this chest pain was worse and lasted longer so his daughters convinced him to go to the ED. He also notes that he's been having increasing cough and occasionally has some mucus production. He's been increasing his use of albuterol he says he finds he's been wheezing. He denies any swelling in his legs. He does report some muscle aches the last several days. Physical exam Gen.-patient is lying in bed alert and awake no acute distress HEENT-patient has dry mucous membranes. He has a supple neck. His sclera are anicteric and there is no conjunctival injection. Cardiovascular-patient has a grade 3/6 systolic murmur and a regular rate and rhythm Respiratory-patient has a expiratory wheeze throughout the lung fields worse at the right base. No crackles, no rhonchi Abdomen-obese, nontender Neuro-extraocular movements intact, pupils equal round and reactive to light, moving all 4 extremities Extremities-no lower extremity edema  Assessment and plan Patient is a 77 year old with worsening chest pain and cough 1. Chest pain-patient has chronic angina. We will continue his home cardiac meds. He will be seen by a cardiologist in the hospital for further recommendations for his positive troponin. This is likely to be to do to demand ischemia due to his COPD exacerbation. He is currently on a heparin drip and we'll await cardiology recommendations on that. 2. Cough and wheeze-we will treat him as a COPD exacerbation. We will provide albuterol and Atrovent nebs every 4 every 2 as well as prednisone and doxycycline. He had a flu PCR pending a lab. If he has a positive flu PCR, we will start Tamiflu. 3. Diabetes-at home he is on metformin and is well-controlled with his last A1c being 6.7. Since he'll be given prednisone in the hospital and he may at some point need dye, we  will discontinue his metformin and started a sensitive sliding scale. 4. GERD-we'll continue his home medications for this. 5. Disposition-we'll plan to have him return to his assisted living facility once his breathing is better and his chest pain is better.  Rhiann Boucher 07/29/2011 4:29 AM

## 2011-07-29 NOTE — Consult Note (Signed)
Cardiology IP Consult  Reason for Consult:elev trop Referring Physician: Dr. Patria Mane Primary Cardiologist: [facility]  HPI: Eugene Bauer is a 77 y.o.male with DM2,COPD, and CAD who presents to the ER tonight with 1 week history of cough, SOB and chest pain.  On arrival he was found to have a fever of 101.  He has chronic CCS II-III angina with an unsuccessful attempt at angioplasty of unknown vessel (?RCA) at [facility] in May 2011.  He was treated with Ranexa at that time but continued to have exertional angina.  He has also been treated for COPD.  He states that over the last few weeks his cough and SOB have been worse.  When he ambulates he gets SOB and then gets chest pain that is relieved with rest.  It is also relieved with breathing treatments.  He has not noticed a fever until today.  He also had body aches today.  He did get a flu shot this year but only 7 days ago.  He is currently chest pain free and without EKG changes.  On arrival to ER he was tachycardic but did not have EKG changes at that time either. He is a resident of [facility].   Past Medical History  Diagnosis Date  . Diabetes mellitus   . MI (myocardial infarction)   . Aortic stenosis   . High cholesterol   . COPD (chronic obstructive pulmonary disease)   . Stroke   . Retinal artery occlusion   . Emphysema   . GERD (gastroesophageal reflux disease)   . Coronary artery disease   . Heart murmur   . Prostate cancer   . On home oxygen therapy     Past Surgical History  Procedure Date  . Rotator cuff repair   . Other surgical history     Bilateral leg stents    Family History  Problem Relation Age of Onset  . Coronary artery disease Brother 57  . Coronary artery disease Sister     Social History:  reports that he quit smoking about 42 years ago. He does not have any smokeless tobacco history on file. He reports that he does not drink alcohol or use illicit drugs.  Allergies:  Allergies  Allergen  Reactions  . Iodine Shortness Of Breath  . Iohexol      Code: SOB, Desc: ivp dye, iodine, Onset Date: 16109604       Current Facility-Administered Medications  Medication Dose Route Frequency Provider Last Rate Last Dose  . acetaminophen (TYLENOL) tablet 975 mg  975 mg Oral Once Lyanne Co, MD   975 mg at 07/29/11 0038  . aspirin tablet 325 mg  325 mg Oral Once Lyanne Co, MD   325 mg at 07/29/11 0036  . heparin 100 units/mL bolus via infusion 4,000 Units  4,000 Units Intravenous Once Colleen Can, PHARMD   4,000 Units at 07/29/11 0053  . heparin ADULT infusion 100 units/ml (25000 units/250 ml)  1,000 Units/hr Intravenous Continuous Colleen Can, PHARMD 10 mL/hr at 07/29/11 0115 1,000 Units/hr at 07/29/11 0115  . morphine 4 MG/ML injection 6 mg  6 mg Intravenous Once Lyanne Co, MD   4 mg at 07/29/11 0037  . piperacillin-tazobactam (ZOSYN) IVPB 3.375 g  3.375 g Intravenous Once Lyanne Co, MD   3.375 g at 07/29/11 0113  . sodium chloride 0.9 % bolus 1,000 mL  1,000 mL Intravenous Once Lyanne Co, MD   1,000 mL at  07/29/11 0037  . vancomycin (VANCOCIN) IVPB 1000 mg/200 mL premix  1,000 mg Intravenous Once Lyanne Co, MD   1,000 mg at 07/29/11 0125  . DISCONTD: furosemide (LASIX) injection 40 mg  40 mg Intravenous Once Meridee Score       Current Outpatient Prescriptions  Medication Sig Dispense Refill  . albuterol (PROVENTIL HFA;VENTOLIN HFA) 108 (90 BASE) MCG/ACT inhaler Inhale 2 puffs into the lungs every 6 (six) hours as needed. For shortness of breath       . aspirin 81 MG tablet Take 81 mg by mouth daily.        Marland Kitchen dipyridamole-aspirin (AGGRENOX) 25-200 MG per 12 hr capsule Take 1 capsule by mouth 2 (two) times daily.        Marland Kitchen gabapentin (NEURONTIN) 300 MG capsule Take 300 mg by mouth at bedtime.        Marland Kitchen HYDROCODONE-ACETAMINOPHEN PO Take 1 tablet by mouth every 6 (six) hours as needed. For pain      . metFORMIN (GLUCOPHAGE) 850 MG tablet Take 850  mg by mouth 2 (two) times daily with a meal.       . metoprolol tartrate (LOPRESSOR) 25 MG tablet Take 25 mg by mouth 2 (two) times daily.        . mometasone (ASMANEX) 220 MCG/INH inhaler Inhale 2 puffs into the lungs daily.        . nitroGLYCERIN (NITRODUR - DOSED IN MG/24 HR) 0.6 mg/hr Place 1 patch onto the skin daily.        Marland Kitchen omeprazole (PRILOSEC) 40 MG capsule Take 40 mg by mouth 2 (two) times daily. 2 tabs at breakfast and dinner       . ranolazine (RANEXA) 500 MG 12 hr tablet Take 500 mg by mouth 2 (two) times daily.        Marland Kitchen terazosin (HYTRIN) 5 MG capsule Take 5 mg by mouth 2 (two) times daily.        Marland Kitchen tiotropium (SPIRIVA) 18 MCG inhalation capsule Place 18 mcg into inhaler and inhale daily.        . Travoprost, BAK Free, (TRAVATAN) 0.004 % SOLN ophthalmic solution Place 1 drop into both eyes at bedtime.         Facility-Administered Medications Ordered in Other Encounters  Medication Dose Route Frequency Provider Last Rate Last Dose  . DISCONTD: 0.9 %  sodium chloride infusion   Intravenous Continuous Jimmie Molly, MD 20 mL/hr at 07/28/11 2158      ROS: A full review of systems is obtained and is negative except as noted in the HPI.  Physical Exam: Blood pressure 131/47, pulse 103, temperature 99 F (37.2 C), temperature source Oral, resp. rate 20, height 5\' 9"  (1.753 m), weight 75 kg (165 lb 5.5 oz), SpO2 96.00%.  GENERAL: no acute distress.  EYES: Extra ocular movements are intact. There is no lid lag. Sclera is anicteric.  ENT: Oropharynx is clear. Dentition is within normal limits.  NECK: Supple. The thyroid is not enlarged.  LYMPH: There are no masses or lymphadenopathy present.  HEART: Regular rate and rhythm with 2/6 SEM and 2/6 holosystolic murmur at apex, JVP 10cm LUNGS: bilateral diffuse wheezing throughout ABDOMEN: Soft, non-tender, and non-distended with normoactive bowel sounds. There is no hepatosplenomegaly.  EXTREMITIES: No clubbing, cyanosis, or edema.    PULSES:  pulses were +2 and equal bilaterally.  SKIN: Warm, dry, and intact.  NEUROLOGIC: The patient was oriented to person, place, and time. No overt neurologic deficits were  detected.  PSYCH: Normal judgment and insight, mood is appropriate.    Results: Lab Results  Component Value Date   WBC 5.6 07/28/2011   HGB 11.0* 07/28/2011   HCT 32.0* 07/28/2011   MCV 85.1 07/28/2011   PLT 143* 07/28/2011    Lab Results  Component Value Date   CREATININE 1.13 07/28/2011   BUN 14 07/28/2011   NA 132* 07/28/2011   K 4.2 07/28/2011   CL 95* 07/28/2011   CO2 28 07/28/2011    Lab Results  Component Value Date   CKTOTAL 215 07/28/2011   CKMB 5.6* 07/28/2011   TROPONINI 1.34* 07/28/2011    CXR: normal heart size, no edema  EKG: NSR without STT changes  Assessment/Plan: 77 yo with CAD and chronic CCS II-III angina, COPD, and DM here with fever, cough, SOB, and chest discomfort.  His troponin is elevated but it is unclear if this represents a chronic elevation, demand ischemia, or an acute coronary syndrome.  He has known obstructive disease and has been hypoxic today in the 80s as well as tachycardic to 120 that could explain demand ischemia.    1. Chest Pain: likely worsening of his chronic angina due to acute illness/demand ischemia - agree with ASA 325 and heparin ggt until ACS is ruled out - continue BB and ranolazine - would probably hold aggrenox while on 325 ASA - monitor in stepdown unit - follow cardiac markers and daily EKG, second set sent now - check echo in AM to eval LV function and valve dz (AS murmur) - records from [facility] are pending, if cardiac enzymes continue to go up or he has more chest pain, may consider repeat LHC at some point after fever/breathing improved  2.Fever: possible influenza vs bronchitis - Rx per primary team  3. COPD:  - nebs, aggressive COPD Rx per primary team    Eugene Bauer 07/29/2011, 2:36 AM

## 2011-07-30 ENCOUNTER — Encounter (HOSPITAL_COMMUNITY): Payer: Self-pay | Admitting: General Practice

## 2011-07-30 LAB — CBC
MCH: 28.6 pg (ref 26.0–34.0)
MCHC: 33.2 g/dL (ref 30.0–36.0)
MCV: 86 fL (ref 78.0–100.0)
Platelets: 144 10*3/uL — ABNORMAL LOW (ref 150–400)
RBC: 3.22 MIL/uL — ABNORMAL LOW (ref 4.22–5.81)

## 2011-07-30 LAB — URINE CULTURE: Culture  Setup Time: 201211290915

## 2011-07-30 LAB — BASIC METABOLIC PANEL
CO2: 26 mEq/L (ref 19–32)
Calcium: 8.6 mg/dL (ref 8.4–10.5)
Creatinine, Ser: 1.19 mg/dL (ref 0.50–1.35)

## 2011-07-30 LAB — GLUCOSE, CAPILLARY
Glucose-Capillary: 101 mg/dL — ABNORMAL HIGH (ref 70–99)
Glucose-Capillary: 178 mg/dL — ABNORMAL HIGH (ref 70–99)

## 2011-07-30 MED ORDER — POLYETHYLENE GLYCOL 3350 17 G PO PACK
17.0000 g | PACK | Freq: Every day | ORAL | Status: DC
  Administered 2011-07-30 – 2011-07-31 (×2): 17 g via ORAL
  Filled 2011-07-30 (×2): qty 1

## 2011-07-30 MED ORDER — FUROSEMIDE 20 MG PO TABS
20.0000 mg | ORAL_TABLET | Freq: Every day | ORAL | Status: DC
  Administered 2011-07-30: 20 mg via ORAL
  Filled 2011-07-30: qty 1

## 2011-07-30 MED ORDER — HEART ATTACK BOUNCING BOOK
Freq: Once | Status: AC
  Administered 2011-07-30: 12:00:00
  Filled 2011-07-30: qty 1

## 2011-07-30 NOTE — Progress Notes (Signed)
FMT[facility] Attending Daily Note: Sara Neal MD [phone] pager office [phone] I have discussed this patient with the resident and reviewed the assessment and plan as documented above. I agree wit the resident's findings and plan.  

## 2011-07-30 NOTE — Progress Notes (Signed)
Inpatient Diabetes Program Recommendations  AACE/ADA: New Consensus Statement on Inpatient Glycemic Control (2009)  Target Ranges:  Prepandial:   less than 140 mg/dL      Peak postprandial:   less than 180 mg/dL (1-2 hours)      Critically ill patients:  140 - 180 mg/dL   CBGs 16/10: 960/ 454/ 228/ 208 mg/dl  Inpatient Diabetes Program Recommendations Correction (SSI): Please increase Novolog correction scale (SSI) to Moderate scale tid ac + HS.  Good control at home- A1C 6.9% (07/29/11)

## 2011-07-30 NOTE — Progress Notes (Signed)
FMTS Daily Intern Progress Note  Subjective: Feeling better this morning; able to get some rest last night.  Cough about the same.  No chest pain currently.  Does experience some mild chest pain with movement.   I have reviewed the patient's medications.  Objective Temp:  [97.8 F (36.6 C)-102.7 F (39.3 C)] 98.6 F (37 C) (11/30 0540) Pulse Rate:  [70-117] 70  (11/30 0540) Resp:  [16-26] 17  (11/30 0540) BP: (113-153)/(26-98) 123/49 mmHg (11/30 0540) SpO2:  [91 %-98 %] 98 % (11/30 0540) Weight:  [180 lb 5.4 oz (81.8 kg)] 180 lb 5.4 oz (81.8 kg) (11/30 0542)   Intake/Output Summary (Last 24 hours) at 07/30/11 0740 Last data filed at 07/30/11 0543  Gross per 24 hour  Intake    240 ml  Output    750 ml  Net   -510 ml    CBG (last 3)   Basename 07/29/11 2233 07/29/11 1757 07/29/11 1231  GLUCAP 208* 228* 167*    General: awake, alert, NAD HEENT: sclera white, MMM CV: RRR, 3/6 systolic murmur Pulm: CTAB, no wheezes, rales, rhonchi Ext: no edema Neuro: alert, oriented x3  Labs and Imaging  Lab 07/30/11 0528 07/29/11 0907 07/28/11 2254  WBC 3.9* 5.8 5.6  HGB 9.2* 11.6* 11.0*  HCT 27.7* 34.2* 32.0*  PLT 144* 163 143*     Lab 07/30/11 0528 07/29/11 0907 07/28/11 2254  NA 135 135 132*  K 3.8 3.6 4.2  CL 100 96 95*  CO2 26 28 28   BUN 18 13 14   CREATININE 1.19 1.02 1.13  LABGLOM -- -- --  GLUCOSE 136* -- --  CALCIUM 8.6 9.7 10.0   Echo: - Left ventricle: The cavity size was normal. There was mild concentric hypertrophy. Systolic function was normal. The estimated ejection fraction was in the range of 55% to 65%. Wall motion was normal; there were no regional wall motion abnormalities. Doppler parameters are consistent with abnormal left ventricular relaxation (grade 1 diastolic dysfunction). Doppler parameters are consistent with high ventricular filling pressure. - Aortic valve: Cusp separation was moderately reduced. There was mild to moderate stenosis.  Valve area: 2.02cm^2(VTI). Valve area: 1.68cm^2 (Vmax). - Mitral valve: Moderately to severely calcified annulus. Mildly thickened leaflets .  Assessment and Plan Eugene Bauer is a 77 y.o. year old male with extensive cardiovascular history and COPD presenting with chest pain and worsening cough.   Chest Pain: This is likely demand ischemia in setting of acute infection; however, given extensive cardiac history, will rule out ACS. Initial troponin was elevated at 1.34 with an unchanged ECG.  His troponin initially increased to 1.58, then came down to 0.92.  This would fit with a demand ischemia from acute illness.  Will continue home anti-anginal meds of metoprolol, aggrenox, nitro patch, and ranolazine. Cardiology has added low-dose isosorbide for additional angina control.  Echo showed grade 1 diastolic dysfunction with EF of 55-65%, mild to moderate aortic stenosis. -appreciate cardiology recommendations  -consider adding low dose lasix per cardiology; will leave up to primary cardiologist -d/c heparin drip -recheck CE tomorrow morning  Cough and wheezing: COPD exacerbation vs flu. With history of body aches, fever and dry cough, flu is possible, even with recent vaccination. COPD exacerbation is also possible with increased cough and albuterol use. No evidence for pneumonia on chest x-ray or exam. Has had blood cultures drawn in the ED. S/p vanc and zosyn in the ED. Influenza panel obtained in the ED was positive.  He was started  on TamiFlu and his treatment for COPD exacerbation was stopped and he was restarted on his home meds.   Fever: This is likely secondary to influenza. No evidence for UTI or other source of infection on history and physical. White count is normal. Urine and blood cultures are pending. Last fever 11/29 at 1200. -monitor fever curve and WBC   Diabetes, type II: Last HbA1c was in 09/2010 was 6.8; 6.9 this admission. Will hold home metformin and start sensitive SSI given  acute presentation.   Hx of cardiovascular disease: Continue home medications as above.   COPD: Stable; will continue home medications.   GERD: Stable; will give protonix while in hospital.   FEN/GI: cardiac diet; saline lock IV Prophylaxis: on heparin drip  Disposition: pending improvement of respiratory symptoms; will plan on discharge back to assisted living facility; transfer to tele today   [name], Denny Peon Pager: [phone] 07/30/2011, 7:40 AM

## 2011-07-30 NOTE — Progress Notes (Signed)
Subjective:  77 year old male with known coronary artery disease with unsuccessful attempt at angioplasty of unknown vessel, possibly RCA, at [facility] in may of 2011 with chronic CCS 2-3 angina on her next who presented with shortness of breath, exertional angina, chest pain with mildly elevated troponin, fever, body aches who received FluMist 7 days ago. Tachycardia was noted on original exam. Influenza A positive. +cough o/n, no CP. TELE unremarkable personally viewed.  Objective:  Vital Signs in the last 24 hours: Temp:  [97.8 F (36.6 C)-102.7 F (39.3 C)] 98.6 F (37 C) (11/30 0540) Pulse Rate:  [70-117] 70  (11/30 0540) Resp:  [16-26] 17  (11/30 0540) BP: (113-153)/(26-98) 123/49 mmHg (11/30 0540) SpO2:  [91 %-98 %] 98 % (11/30 0540) Weight:  [81.8 kg (180 lb 5.4 oz)] 180 lb 5.4 oz (81.8 kg) (11/30 0542)  Intake/Output from previous day: 11/29 0701 - 11/30 0700 In: 240 [P.O.:240] Out: 750 [Urine:750]   Physical Exam: GENERAL: no acute distress.  EYES: Extra ocular movements are intact. There is no lid lag. Sclera is anicteric.  ENT: Oropharynx is clear. Dentition is within normal limits.  NECK: Supple. The thyroid is not enlarged.  LYMPH: There are no masses or lymphadenopathy present.  HEART: Regular rate and rhythm with 2/6 SEM and 2/6 holosystolic murmur at apex, JVP 10cm  LUNGS: bilateral diffuse wheezing throughout  ABDOMEN: Soft, non-tender, and non-distended with normoactive bowel sounds. There is no hepatosplenomegaly.  EXTREMITIES: No clubbing, cyanosis, or edema.  PULSES: pulses were +2 and equal bilaterally.  SKIN: Warm, dry, and intact.  NEUROLOGIC: The patient was oriented to person, place, and time. No overt neurologic deficits were detected.  PSYCH: Normal judgment and insight, mood is appropriate.     Lab Results:  [facility] 07/30/11 0528 07/29/11 0907  WBC 3.9* 5.8  HGB 9.2* 11.6*  PLT 144* 163    Basename 07/30/11 0528 07/29/11 0907  NA 135 135    K 3.8 3.6  CL 100 96  CO2 26 28  GLUCOSE 136* 158*  BUN 18 13  CREATININE 1.19 1.02    Basename 07/29/11 1426 07/29/11 0227  TROPONINI 0.92* 1.58*   Hepatic Function Panel   Imaging: Dg Chest 2 View  07/28/2011  *RADIOLOGY REPORT*  Clinical Data: Cough and shortness breath.  CHEST - 2 VIEW  Comparison: 09/29/2010  Findings: Stable COPD.  No acute infiltrate, edema or pleural effusion identified.  The heart size is stable. Stable degenerative changes of the thoracic spine.  IMPRESSION: Stable COPD.  No acute findings.  Original Report Authenticated By: Reola Calkins, M.D.    EKG:  NSR, heart rate improved.   Cardiac Studies:  ECHO - mild to mod AS, normal EF, elevated LA filling pressure  Assessment/Plan:   Chest pain/NSTEMI - patient with chronic stable angina on multidrug regimen. Failed attempted PCI in May, [place]. Mildly elevated troponin currently falling. In part, history point elevation is likely secondary to demand ischemia in the setting of COPD, fever as well as underlying CAD and newly diagnosed influenza A. For now, I would continue with aggressive medical management. I would not pursue cardiac catheterization at this time given prior attempt. Echocardiogram as above. I added low-dose isosorbide. Would rec ASA. Looked on MAR. ? Stopped. Cont Bb. Consider addition of statin. Given elevated left atrial filling pressure will add low dose lasix.  Aortic stenosis- mild to moderate. Should not be hemodynamically significant.  Fever - influenza A +/bronchitis  COPD - aggressive care per  primary team.  Will sign off. Please call if any further questions. Pt will follow up with primary cardiologist at [facility].    Hyde Sires 07/30/2011, 7:58 AM

## 2011-07-30 NOTE — Progress Notes (Signed)
ANTICOAGULATION CONSULT NOTE - Follow Up Consult  Pharmacy Consult for heparin Indication: chest pain/ACS  Labs:  Basename 07/30/11 0528 07/29/11 1426 07/29/11 0907 07/29/11 0227 07/28/11 2318 07/28/11 2254  HGB 9.2* -- 11.6* -- -- --  HCT 27.7* -- 34.2* -- -- 32.0*  PLT 144* -- 163 -- -- 143*  APTT -- -- -- -- -- --  LABPROT -- -- -- -- -- --  INR -- -- -- -- -- --  HEPARINUNFRC 0.49 -- 0.43 -- -- --  CREATININE 1.19 -- 1.02 -- -- 1.13  CKTOTAL -- 330* -- 202 215 --  CKMB -- 5.2* -- 5.1* 5.6* --  TROPONINI -- 0.92* -- 1.58* 1.34* --   Estimated Creatinine Clearance: 48.7 ml/min (by C-G formula based on Cr of 1.19).  Assessment: Heparin level 0.49 on heaprin 1000 unit/hr. Level therapeutic. No bleeding reported. No cath planned. Goal of Therapy:  Heparin level 0.3-0.7 units/ml   Plan:  Continue heparin at 1000 units/hr. Daily HL and CBC while on heparin. Consider stopping heparin.  I stopped Imdur 2nd pt on NTG patch (OK per Dr Anne Fu).  At home pt on both asa 81 and aggrenox bid.  Len Childs T 07/30/2011,9:42 AM

## 2011-07-31 LAB — CARDIAC PANEL(CRET KIN+CKTOT+MB+TROPI)
Relative Index: 2.3 (ref 0.0–2.5)
Total CK: 208 U/L (ref 7–232)
Troponin I: 0.62 ng/mL (ref <0.30)

## 2011-07-31 LAB — CBC
HCT: 26.9 % — ABNORMAL LOW (ref 39.0–52.0)
MCV: 86.8 fL (ref 78.0–100.0)
RBC: 3.1 MIL/uL — ABNORMAL LOW (ref 4.22–5.81)
WBC: 3.6 10*3/uL — ABNORMAL LOW (ref 4.0–10.5)

## 2011-07-31 LAB — BASIC METABOLIC PANEL
Calcium: 8.7 mg/dL (ref 8.4–10.5)
Creatinine, Ser: 1.03 mg/dL (ref 0.50–1.35)
GFR calc Af Amer: 77 mL/min — ABNORMAL LOW (ref >90)
GFR calc non Af Amer: 66 mL/min — ABNORMAL LOW (ref >90)

## 2011-07-31 MED ORDER — HEPARIN SODIUM (PORCINE) 5000 UNIT/ML IJ SOLN
5000.0000 [IU] | Freq: Three times a day (TID) | INTRAMUSCULAR | Status: DC
  Filled 2011-07-31 (×3): qty 1

## 2011-07-31 MED ORDER — OSELTAMIVIR PHOSPHATE 75 MG PO CAPS: 75.0000 mg | ORAL_CAPSULE | Freq: Two times a day (BID) | ORAL | Status: AC

## 2011-07-31 NOTE — Progress Notes (Addendum)
Md instructed to see what patient O2 sat was on 1 liter, turned O2 to 1liter, sats now 96%  Ambulated patient with O2 at 2liters, O2sat was 99-98%,  On 1liter 96-97%

## 2011-07-31 NOTE — Discharge Summary (Signed)
Discharge Summary 07/31/2011 9:16 AM  Vivianne Spence DOB: 1926-12-18 MRN: [id number]  Date of Admission: 07/28/2011 Date of Discharge: 07/31/2011  PCP: No primary provider on file. [facility] [place] cardiology Consultants: Dr. Anne Fu in [facility] cardiology  Reason for Admission: chest pain and cough/wheeze  Discharge Diagnosis Primary 1. Influenza 2. Demand Ischemia Secondary 1. Diabetes, type II 2. Hx of cardiovascular disease 3. COPD 4. GERD 5. CHF, diastolic  Hospital Course: Falon Flinchum is a 77 y.o. year old male with extensive cardiovascular history and COPD presenting with chest pain and worsening cough.   Chest Pain: This is likely demand ischemia in setting of acute infection; however, given his extensive cardiac history, troponins were tended and cardiology was consulted.  His troponins were elevated at 1.34 on admission, peaked at 1.58, then trended down to 0.62 at discharge.  His ECG was unchanged. He was continued on his home anti-anginal meds of metoprolol, aggrenox, nitro patch, and ranolazine. Cardiology added low-dose isosorbide for one day; this was discontinued by pharmacy since he is already on nitro patch. An echo showed grade 1 diastolic dysfunction with EF of 55-65%, mild to moderate aortic stenosis.  Cardiology recommended adding low dose lasix, a statin, and full dose aspirin; however, this was deferred to primary cardiologist.  Influenza: Initial differential was COPD exacerbation vs flu. With history of body aches, fever and dry cough, an influenza PCR was obtained and was positive for influenza A.  He was inititally started on treatment for a COPD exacerbation with doxycycline and prednisone; however this was discontinued and he was continued on his home medications when the influenza came back positive. A chest x-ray was negative for pneumonia. He was started on TamiFlu.  On day of discharge, his oxygen saturation was noted to drop to 84% with ambulation.  Case  management was consulted for home oxygen.  It is unclear if this will be temporary, but I expect he would benefit from chronic oxygen therapy at home.    Fever: This is likely secondary to influenza. No evidence for UTI or other source of infection on history and physical. His white count remained normal. Urine was negative for infection and blood cultures have been NGTD.  Afebrile since 11/29 at 1200.    Diabetes, type II: Last HbA1c was in 09/2010 was 6.8; 6.9 this admission. He was on a sensitive SSI while in the hospital.    Hx of cardiovascular disease: Continue home medications.   COPD: Stable; continued on home medications.   GERD: Stable; on protonix while in hospital.   Diastolic CHF: Echo showed grade I diastolic dysfunction and EF of 55-65%.  Stable this admission with no signs of fluid overload.   Procedures: none  Discharge Medications Medication List  As of 07/31/2011  9:16 AM   START taking these medications         oseltamivir 75 MG capsule   Commonly known as: TAMIFLU   Take 1 capsule (75 mg total) by mouth 2 (two) times daily.         CONTINUE taking these medications         albuterol 108 (90 BASE) MCG/ACT inhaler   Commonly known as: PROVENTIL HFA;VENTOLIN HFA      aspirin 81 MG tablet      dipyridamole-aspirin 25-200 MG per 12 hr capsule   Commonly known as: AGGRENOX      gabapentin 300 MG capsule   Commonly known as: NEURONTIN      HYDROCODONE-ACETAMINOPHEN PO  metFORMIN 850 MG tablet   Commonly known as: GLUCOPHAGE      metoprolol tartrate 25 MG tablet   Commonly known as: LOPRESSOR      mometasone 220 MCG/INH inhaler   Commonly known as: ASMANEX      nitroGLYCERIN 0.6 mg/hr   Commonly known as: NITRODUR - Dosed in mg/24 hr      omeprazole 40 MG capsule   Commonly known as: PRILOSEC      ranolazine 500 MG 12 hr tablet   Commonly known as: RANEXA      terazosin 5 MG capsule   Commonly known as: HYTRIN      tiotropium 18 MCG  inhalation capsule   Commonly known as: SPIRIVA      Travoprost (BAK Free) 0.004 % Soln ophthalmic solution   Commonly known as: TRAVATAN          Where to get your medications    These are the prescriptions that you need to pick up.   You may get these medications from any pharmacy.         oseltamivir 75 MG capsule            Pertinent Hospital Labs Influenza: positive for influenza A HbA1c: 6.9   Lab 07/31/11 0600 07/30/11 0528 07/29/11 0907  WBC 3.6* 3.9* 5.8  HGB 8.9* 9.2* 11.6*  HCT 26.9* 27.7* 34.2*  PLT 149* 144* 163    Lab 07/31/11 0600 07/30/11 0528 07/29/11 0907  NA 136 135 135  K 3.8 3.8 3.6  CL 100 100 96  CO2 30 26 28   BUN 18 18 13   CREATININE 1.03 1.19 1.02  LABGLOM -- -- --  GLUCOSE 116* -- --  CALCIUM 8.7 8.6 9.7    Lab 07/31/11 0630 07/29/11 1426 07/29/11 0227  CKTOTAL 208 330* 202  TROPONINI 0.62* 0.92* 1.58*  TROPONINT -- -- --  CKMBINDEX -- -- --    CXR: Stable COPD. No acute findings.  Echocardiogram: - Left ventricle: The cavity size was normal. There was mild concentric hypertrophy. Systolic function was normal. The estimated ejection fraction was in the range of 55% to 65%. Wall motion was normal; there were no regional wall motion abnormalities. Doppler parameters are consistent with abnormal left ventricular relaxation (grade 1 diastolic dysfunction). Doppler parameters are consistent with high ventricular filling pressure. - Aortic valve: Cusp separation was moderately reduced. There was mild to moderate stenosis. Valve area: 2.02cm^2(VTI). Valve area: 1.68cm^2 (Vmax). - Mitral valve: Moderately to severely calcified annulus. Mildly thickened leaflets .  Discharge instructions: see AVS; instructed to follow up with this primary cardiologist and to increase activity slowly  Condition at discharge: stable  Disposition: home with daughter  Pending Tests: final read on blood cultures  Follow up: Follow-up Information     Follow up with [facility]. Call in 2 days. (for follow up appointment in the next 1-2 weeks)          Follow up Issues:  1. Consider adjusting cardiac meds as follows: adding low dose lasix, statin, and full dose aspirin.  These were recommendations from the inpatient cardiology consult; please use your discretion as you know the patient better. 2. Follow up respiratory status.  He was discharged with home O2 as his O2 sats dropped to 84% with ambulation.  May benefit from chronic O2 at home.  [name], Cherissa Hook 07/31/2011, 9:33 AM

## 2011-07-31 NOTE — Progress Notes (Signed)
FMTS Attending Daily Note: Rondi Ivy MD [phone] pager office [phone] I  have seen and examined this patient, reviewed their chart. I have discussed this patient with the resident. I agree with the resident's findings, assessment and care plan. 

## 2011-07-31 NOTE — Discharge Summary (Signed)
Family Medicine Teaching Service  Discharge Note : Attending Sara Neal MD Pager [phone] Office [phone] I have seen and examined this patient, reviewed their chart and discussed discharge planning wit the resident. I agree with the discharge plan as above.  

## 2011-07-31 NOTE — Progress Notes (Addendum)
Contacted [facility] for home oxygen for scheduled d/c today. Instructed pt/family to contact [facility] as soon as they arrive home to set up oxygen.

## 2011-07-31 NOTE — Progress Notes (Signed)
FMTS Daily Intern Progress Note  Subjective: Doing well this morning.  Does get short of breath with activity, but this is normal for him.  Feeling a little weak, but steady on his feet.  Daughter at bedside.  I have reviewed the patient's medications.  Objective Temp:  [98.1 F (36.7 C)-98.3 F (36.8 C)] 98.1 F (36.7 C) (12/01 0544) Pulse Rate:  [73-79] 79  (12/01 0544) Resp:  [17-22] 22  (12/01 0544) BP: (113-134)/(55-57) 124/56 mmHg (12/01 0544) SpO2:  [96 %-99 %] 96 % (12/01 0544) Weight:  [178 lb 5.6 oz (80.9 kg)] 178 lb 5.6 oz (80.9 kg) (12/01 0544)   Intake/Output Summary (Last 24 hours) at 07/31/11 0753 Last data filed at 07/31/11 0545  Gross per 24 hour  Intake    480 ml  Output    450 ml  Net     30 ml    CBG (last 3)   Basename 07/30/11 2149 07/30/11 1615 07/30/11 1403  GLUCAP 178* 101* 123*    General: awake, alert, NAD HEENT: sclera white, MMM CV: RRR, 3/6 systolic murmur Pulm: CTAB, no wheezes, rales, rhonchi Ext: no edema Neuro: alert, oriented x3  Labs and Imaging   Lab 07/31/11 0600 07/30/11 0528 07/29/11 0907  WBC 3.6* 3.9* 5.8  HGB 8.9* 9.2* 11.6*  HCT 26.9* 27.7* 34.2*  PLT 149* 144* 163     Lab 07/31/11 0600 07/30/11 0528 07/29/11 0907  NA 136 135 135  K 3.8 3.8 3.6  CL 100 100 96  CO2 30 26 28   BUN 18 18 13   CREATININE 1.03 1.19 1.02  LABGLOM -- -- --  GLUCOSE 116* -- --  CALCIUM 8.7 8.6 9.7    Lab 07/31/11 0630 07/29/11 1426 07/29/11 0227  CKTOTAL 208 330* 202  TROPONINI 0.62* 0.92* 1.58*  TROPONINT -- -- --  CKMBINDEX -- -- --   Echo: - Left ventricle: The cavity size was normal. There was mild concentric hypertrophy. Systolic function was normal. The estimated ejection fraction was in the range of 55% to 65%. Wall motion was normal; there were no regional wall motion abnormalities. Doppler parameters are consistent with abnormal left ventricular relaxation (grade 1 diastolic dysfunction). Doppler parameters are  consistent with high ventricular filling pressure. - Aortic valve: Cusp separation was moderately reduced. There was mild to moderate stenosis. Valve area: 2.02cm^2(VTI). Valve area: 1.68cm^2 (Vmax). - Mitral valve: Moderately to severely calcified annulus. Mildly thickened leaflets .  Assessment and Plan Eugene Bauer is a 77 y.o. year old male with extensive cardiovascular history and COPD presenting with chest pain and worsening cough.   Chest Pain: This is likely demand ischemia in setting of acute infection; however, given extensive cardiac history, will rule out ACS. Initial troponin was elevated at 1.34 with an unchanged ECG.  His troponin initially increased to 1.58, then came down to 0.92.  This would fit with a demand ischemia from acute illness.  Will continue home anti-anginal meds of metoprolol, aggrenox, nitro patch, and ranolazine. Cardiology has added low-dose isosorbide for additional angina control.  Echo showed grade 1 diastolic dysfunction with EF of 55-65%, mild to moderate aortic stenosis. -appreciate cardiology recommendations  -consider adding low dose lasix, statin, and full dose aspirin per cardiology; will leave up to primary cardiologist  Cough and wheezing: COPD exacerbation vs flu. With history of body aches, fever and dry cough, flu is possible, even with recent vaccination. COPD exacerbation is also possible with increased cough and albuterol use. No evidence for pneumonia  on chest x-ray or exam. Has had blood cultures drawn in the ED. S/p vanc and zosyn in the ED. Influenza panel obtained in the ED was positive.  He was started on TamiFlu and his treatment for COPD exacerbation was stopped and he was restarted on his home meds.  -O2 sats dropped to 84% after using the restroom and being off oxygen.  Improved to 90-92% after 5 minutes of rest with out oxygen. Increased to mid-90s with administration of oxygen -RN to walk and obtain ambulatory O2 sats --> drop to  84-87% ambulating on room air, return to high-90s on 2L O2.  Fever: This is likely secondary to influenza. No evidence for UTI or other source of infection on history and physical. White count is normal. Urine and blood cultures are pending. Last fever 11/29 at 1200. -monitor fever curve and WBC   Diabetes, type II: Last HbA1c was in 09/2010 was 6.8; 6.9 this admission. Will hold home metformin and start sensitive SSI given acute presentation.   Hx of cardiovascular disease: Continue home medications as above.   COPD: Stable; will continue home medications.   GERD: Stable; will give protonix while in hospital.   FEN/GI: cardiac diet; saline lock IV Prophylaxis: heparin SQ  Disposition: home with daughter today; CM to arrange home oxygen   [name], Casin Federici Pager: [phone] 07/31/2011, 7:53 AM

## 2011-07-31 NOTE — Progress Notes (Addendum)
Ambulated patient with stand by assistant, ambulated 331ft on room air. O2 sats ranged from 83%-87%.  Returned patient to room applied O2 at 2 Liters, O2 sats are now 97-98%

## 2011-08-04 LAB — CULTURE, BLOOD (ROUTINE X 2)
Culture  Setup Time: 201211290847
Culture: NO GROWTH

## 2011-08-31 ENCOUNTER — Emergency Department (INDEPENDENT_AMBULATORY_CARE_PROVIDER_SITE_OTHER)
Admission: EM | Admit: 2011-08-31 | Discharge: 2011-08-31 | Disposition: A | Discharge status: Home / Self Care | Payer: Medicare Other | Source: Home / Self Care | Attending: Emergency Medicine | Admitting: Emergency Medicine

## 2011-08-31 ENCOUNTER — Encounter (HOSPITAL_COMMUNITY): Payer: Self-pay | Admitting: *Deleted

## 2011-08-31 DIAGNOSIS — I209 Angina pectoris, unspecified: Secondary | ICD-10-CM

## 2011-08-31 DIAGNOSIS — I2510 Atherosclerotic heart disease of native coronary artery without angina pectoris: Secondary | ICD-10-CM

## 2011-08-31 MED ORDER — NITROGLYCERIN 0.6 MG/HR TD PT24: 1.0000 | MEDICATED_PATCH | Freq: Every day | TRANSDERMAL | Status: DC

## 2011-08-31 NOTE — ED Notes (Signed)
Pt is here for refill of his Nitro patch.  [facility], [facility] did not realize he only had 1 patch left.  Uses daily.  Recently discharged from [facility] with the flu and "mild heart attack."

## 2011-08-31 NOTE — ED Provider Notes (Signed)
History     CSN: [id number]  Arrival date & time 08/31/11  7846   First MD Initiated Contact with Patient 08/31/11 [phone]      Chief Complaint  Patient presents with  . Medication Refill    (Consider location/radiation/quality/duration/timing/severity/associated sxs/prior treatment) HPI Comments: Mr. Eugene Bauer is in today for a refill of his nitroglycerin patch. He usually gets this from the [place], but ran out and that'll be over a month before he can get his medicine through the mail order pharmacy. He takes 0.6 mg per hour strength which she applies daily. Right now he's been doing well with no angina or chest pain, although he has had 2 heart attacks and 2 strokes in the past. He denies any other cardiovascular complaints including shortness of breath, chest pain, tightness, pressure, palpitations, dizziness, syncope, or ankle edema.   Past Medical History  Diagnosis Date  . Diabetes mellitus   . MI (myocardial infarction)   . Aortic stenosis   . High cholesterol   . COPD (chronic obstructive pulmonary disease)   . Stroke   . Retinal artery occlusion   . Emphysema   . GERD (gastroesophageal reflux disease)   . Coronary artery disease   . Heart murmur   . Prostate cancer   . On home oxygen therapy   . Shortness of breath   . Angina   . Hypertension   . Pneumonia   . Arthritis     Past Surgical History  Procedure Date  . Rotator cuff repair   . Other surgical history     Bilateral leg stents  . Cardiac catheterization   . Hernia repair     Family History  Problem Relation Age of Onset  . Coronary artery disease Brother 66  . Coronary artery disease Sister     History  Substance Use Topics  . Smoking status: Former Smoker    Quit date: 08/30/1968  . Smokeless tobacco: Not on file  . Alcohol Use: No      Review of Systems  Respiratory: Negative for cough, chest tightness, shortness of breath and wheezing.   Cardiovascular: Negative for chest pain,  palpitations and leg swelling.  Neurological: Negative for dizziness, weakness, light-headedness, numbness and headaches.    Allergies  Iodine and Iohexol  Home Medications   Current Outpatient Rx  Name Route Sig Dispense Refill  . ALBUTEROL SULFATE HFA 108 (90 BASE) MCG/ACT IN AERS Inhalation Inhale 2 puffs into the lungs every 6 (six) hours as needed. For shortness of breath     . ASPIRIN 81 MG PO TABS Oral Take 81 mg by mouth daily.      . ASPIRIN-DIPYRIDAMOLE 25-200 MG PO CP12 Oral Take 1 capsule by mouth 2 (two) times daily.      Marland Kitchen GABAPENTIN 300 MG PO CAPS Oral Take 300 mg by mouth at bedtime.      Marland Kitchen HYDROCODONE-ACETAMINOPHEN PO Oral Take 1 tablet by mouth every 6 (six) hours as needed. For pain    . METFORMIN HCL 850 MG PO TABS Oral Take 850 mg by mouth 2 (two) times daily with a meal.     . METOPROLOL TARTRATE 25 MG PO TABS Oral Take 25 mg by mouth 2 (two) times daily.      . MOMETASONE FUROATE 220 MCG/INH IN AEPB Inhalation Inhale 2 puffs into the lungs daily.      Marland Kitchen NITROGLYCERIN 0.6 MG/HR TD PT24 Transdermal Place 1 patch (0.6 mg total) onto the skin daily. 60 patch  0  . OMEPRAZOLE 40 MG PO CPDR Oral Take 40 mg by mouth 2 (two) times daily. 2 tabs at breakfast and dinner     . RANOLAZINE ER 500 MG PO TB12 Oral Take 500 mg by mouth 2 (two) times daily.      Marland Kitchen TERAZOSIN HCL 5 MG PO CAPS Oral Take 5 mg by mouth 2 (two) times daily.      Marland Kitchen TIOTROPIUM BROMIDE MONOHYDRATE 18 MCG IN CAPS Inhalation Place 18 mcg into inhaler and inhale daily.      . TRAVOPROST (BAK FREE) 0.004 % OP SOLN Both Eyes Place 1 drop into both eyes at bedtime.        BP 122/61  Pulse 92  Temp(Src) 98.5 F (36.9 C) (Oral)  Resp 20  SpO2 92%  Physical Exam  Nursing note and vitals reviewed. Constitutional: He appears well-developed and well-nourished. No distress.  Neck: No JVD present.  Cardiovascular: Normal rate, regular rhythm and intact distal pulses.  Exam reveals no gallop and no friction rub.     Murmur (there is a grade 3/6 systolic ejection murmur heard best at the apex.) heard. Pulmonary/Chest: Effort normal. No respiratory distress. He has no wheezes. He has rales (he has rales at both lung bases).  Abdominal: Soft. Bowel sounds are normal. He exhibits no distension, no abdominal bruit, no pulsatile midline mass and no mass. There is no hepatosplenomegaly. There is no tenderness. There is no rebound and no guarding.  Musculoskeletal: He exhibits no edema.  Skin: He is not diaphoretic.    ED Course  Procedures (including critical care time)  Labs Reviewed - No data to display No results found.   1. Angina pectoris   2. Coronary artery disease       MDM          Roque Lias, MD 08/31/11 [phone]

## 2011-09-04 IMAGING — CR DG CHEST 2V
2 series · 2 of 2 positions shown · non-contrast
Comparison: 11/02/2007

CLINICAL DATA: TIA, weakness, confusion, history COPD and stroke

CHEST - 2 VIEW

[w chest pa]
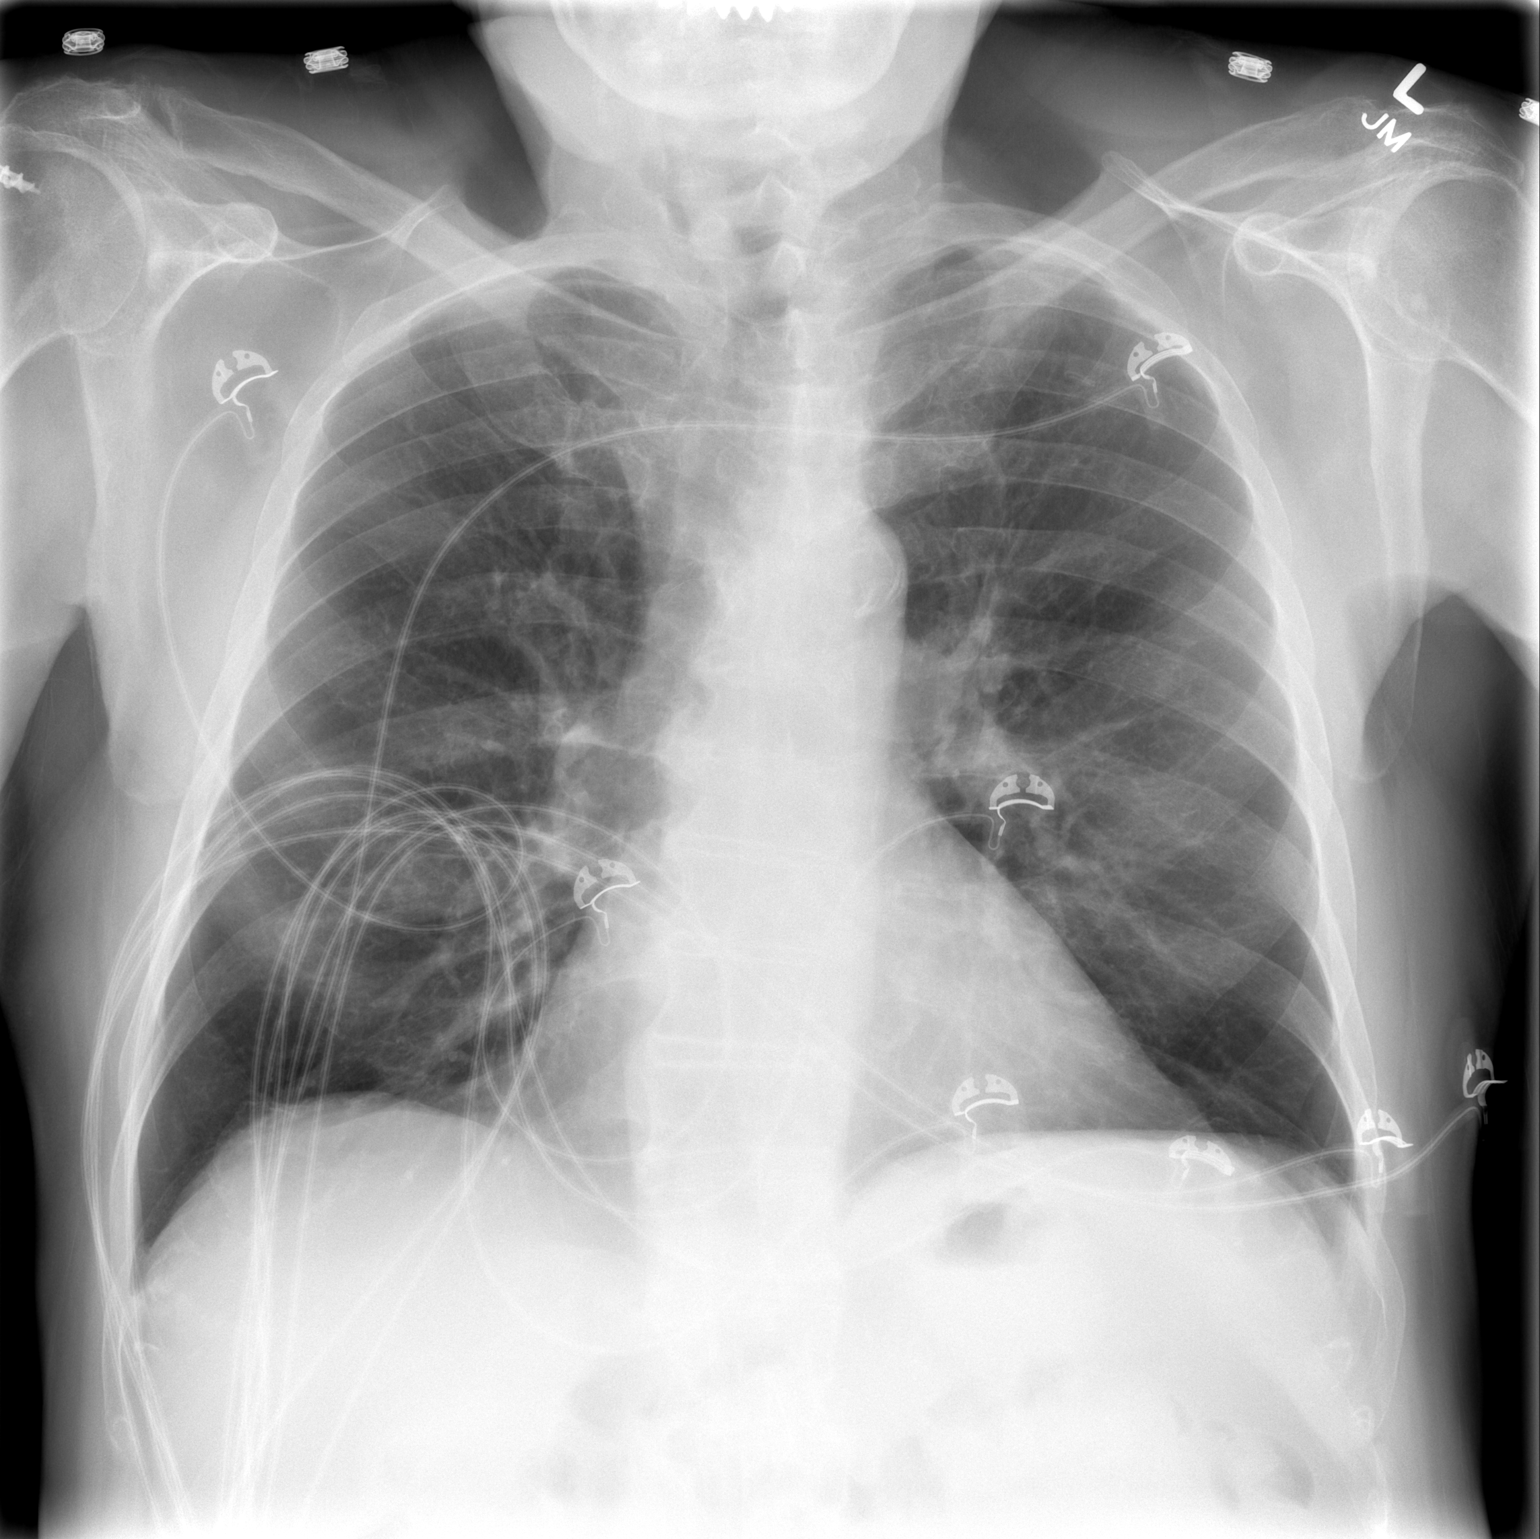

[w chest lat]
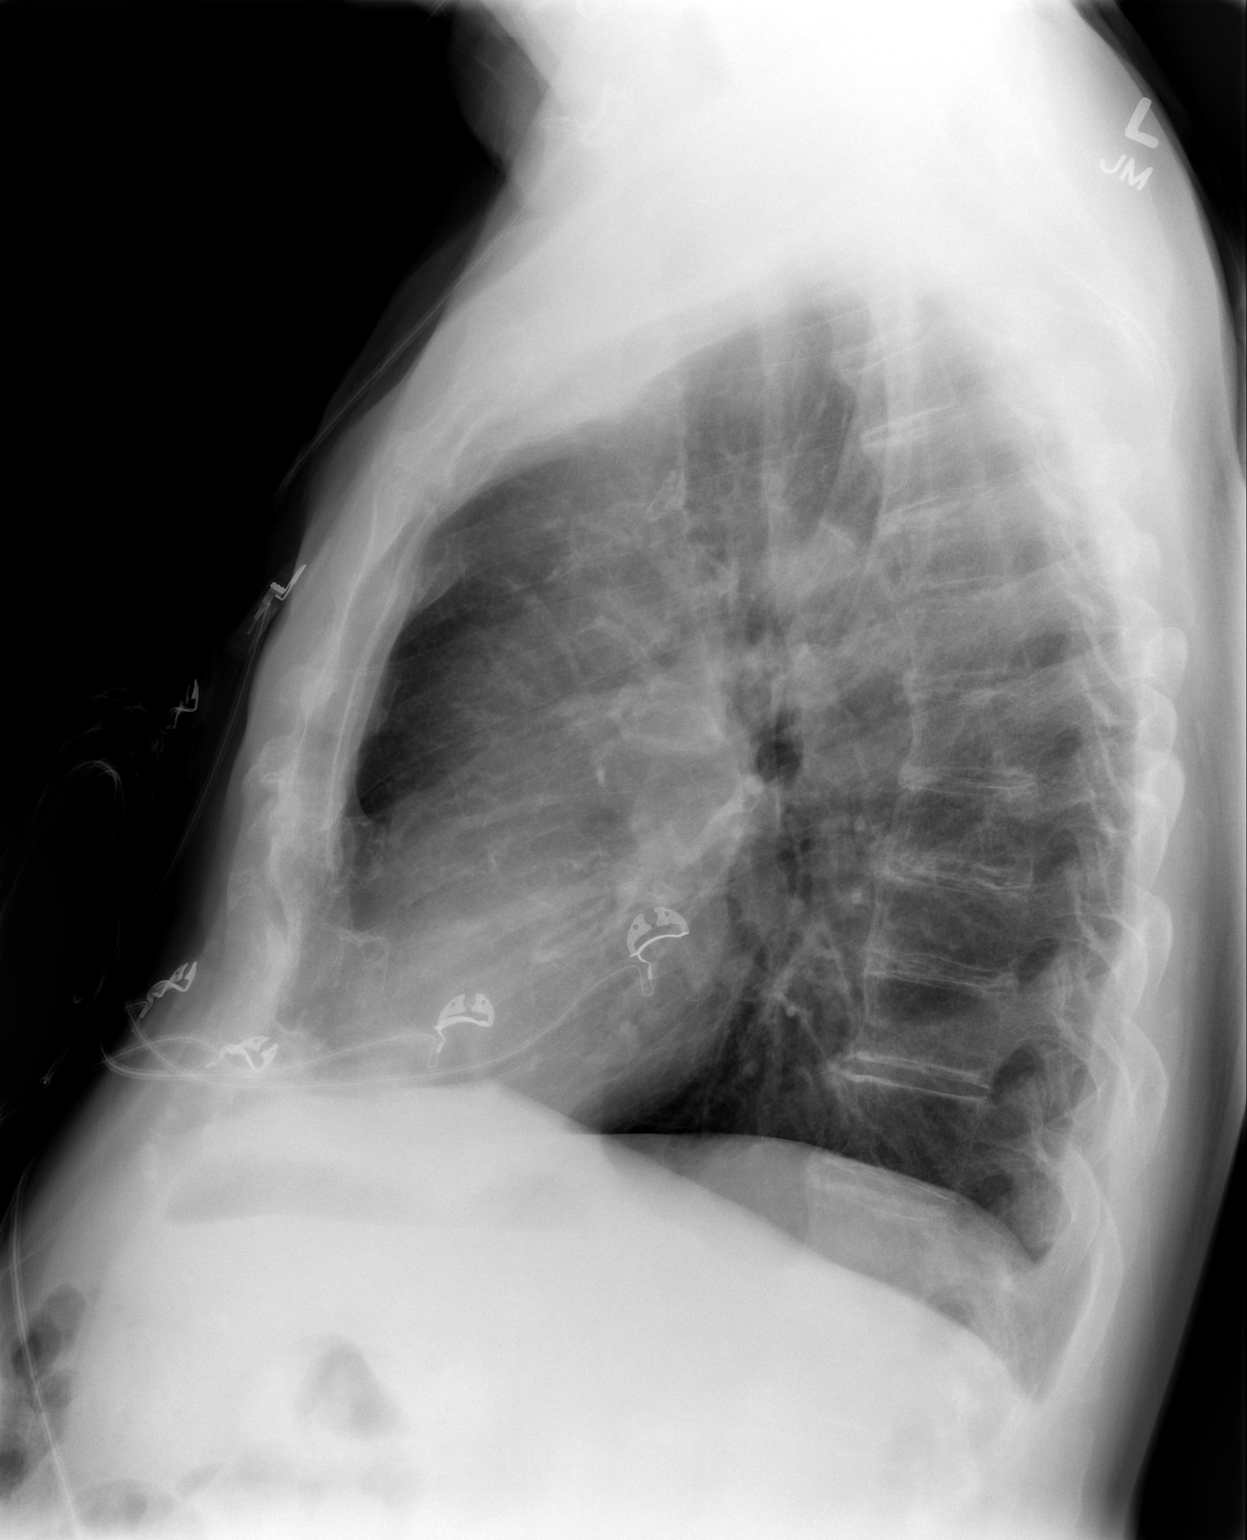

[2 of 2 positions shown; findings below may reference images not displayed]

FINDINGS: Normal heart size, mediastinal contours, and pulmonary vascularity.
Minimal peribronchial thickening and hyperinflation question COPD.
No pulmonary filtrate or pleural effusion.
Bones appear demineralized.
Cardiac monitoring lines project over chest.
IMPRESSION: Question COPD.
No acute abnormalities.

## 2012-06-15 ENCOUNTER — Encounter (HOSPITAL_COMMUNITY): Payer: Self-pay | Admitting: *Deleted

## 2012-06-15 ENCOUNTER — Emergency Department (INDEPENDENT_AMBULATORY_CARE_PROVIDER_SITE_OTHER)
Admission: EM | Admit: 2012-06-15 | Discharge: 2012-06-15 | Disposition: A | Discharge status: Home / Self Care | Payer: Medicare Other | Source: Home / Self Care | Attending: Emergency Medicine | Admitting: Emergency Medicine

## 2012-06-15 ENCOUNTER — Emergency Department (INDEPENDENT_AMBULATORY_CARE_PROVIDER_SITE_OTHER): Payer: Medicare Other

## 2012-06-15 DIAGNOSIS — J209 Acute bronchitis, unspecified: Secondary | ICD-10-CM

## 2012-06-15 MED ORDER — GUAIFENESIN-CODEINE 100-10 MG/5ML PO SYRP: 10.0000 mL | ORAL_SOLUTION | Freq: Four times a day (QID) | ORAL | Status: DC | PRN

## 2012-06-15 MED ORDER — PREDNISONE 5 MG PO KIT: 1.0000 | PACK | Freq: Every day | ORAL | Status: DC

## 2012-06-15 MED ORDER — AMOXICILLIN-POT CLAVULANATE 875-125 MG PO TABS: 1.0000 | ORAL_TABLET | Freq: Two times a day (BID) | ORAL | Status: DC

## 2012-06-15 NOTE — ED Notes (Signed)
Pt    Reports  Symptoms  Of  Cough   /  Congested    Shortness of  Breath      X    5  Days   The  Cough  Is   Productive  The  Pt        Is  On  Nasal  o2   At   Home            He  Has  Chronic  Copd   And  Takes  Inhalers  As  Well

## 2012-06-16 NOTE — ED Provider Notes (Signed)
Chief Complaint  Patient presents with  . Cough    History of Present Illness:   The patient is an 78 year old male who has had a five-day history of cough productive of green sputum. He is mildly short of breath, but he states he's always somewhat short of breath due to COPD and heart disease. He's not any more short of breath than usual. He denies any chest pain, pressure or tightness. He's on oxygen continuously. He had a heart attack in July 11. He's due to have a stent placed next week. He has a mild scratchy throat. No fever, chills, or sweats. He's been nauseated since July usually after lunch. No vomiting or diarrhea.  Review of Systems:  Other than noted above, the patient denies any of the following symptoms: Systemic:  No fevers, chills, sweats, weight loss or gain, fatigue, or tiredness. ENT:  No nasal congestion, sneezing, itching, postnasal drip, sinus pressure, headache, sore throat, or hoarseness. Lungs:  No wheezing, shortness of breath, chest tightness or congestion. Heart:  No chest pain, tightness, pressure, PND, orthopnea, or ankle edema. GI:  No indigestion, heartburn, waterbrash, burping, abdominal pain, nausea, or vomiting.  PMFSH:  Past medical history, family history, social history, meds, and allergies were reviewed.  Specifically, there is no history of asthma, allergies, reflux esophagitis or cigarette smoking.   Physical Exam:   Vital signs:  BP 105/59  Pulse 88  Temp 98 F (36.7 C) (Oral)  Resp 24  SpO2 98% General:  Alert and oriented.  In no distress.  Skin warm and dry. ENT: TMs and ear canals normal.  Nasal mucosa normal, without drainage.  Pharynx clear without exudate or drainage.  No intraoral lesions. Neck:  No adenopathy, tenderness or mass.  No JVD. Lungs:  No respiratory distress.  Breath sounds clear and equal bilaterally.  No wheezes, rales or rhonchi. Heart:  Regular rhythm, no gallops or murmers.  No pedal edema. Abdomon:  Soft and nontender.   No organomegaly or mass.  Radiology:  Dg Chest 2 View  06/15/2012  *RADIOLOGY REPORT*  Clinical Data: Cough, shortness of breath, chest cold for 1 week, former smoker, history diabetes, coronary artery disease post MI, COPD, hypertension  CHEST - 2 VIEW  Comparison: 07/28/2011  Findings: Upper-normal size of cardiac silhouette. Atherosclerotic calcification aorta. Mediastinal contours and pulmonary vascular otherwise normal. Emphysematous changes without infiltrate, pleural effusion or pneumothorax. Minimal chronic peribronchial thickening. No acute osseous findings. Scattered end plate spur formation thoracic spine with dextroconvex scoliosis.  IMPRESSION: Changes of COPD. No acute abnormalities.   Original Report Authenticated By: Lollie Marrow, M.D.     Date: 06/16/2012  Rate: 76  Rhythm: normal sinus rhythm  QRS Axis: normal  Intervals: normal  ST/T Wave abnormalities: normal  Conduction Disutrbances:none  Narrative Interpretation: Normal sinus rhythm, normal EKG.  Old EKG Reviewed: none available  Assessment:  The encounter diagnosis was Acute bronchitis.  There is no evidence of pneumonia. No evidence of acute cardiac disease. I think this is just a mild flareup of COPD. Will plan to treat with Augmentin and a prednisone taper with strict instructions to return if he should become worse in any way or is not getting better in a day or 2.  Plan:   1.  The following meds were prescribed:   New Prescriptions   AMOXICILLIN-CLAVULANATE (AUGMENTIN) 875-125 MG PER TABLET    Take 1 tablet by mouth 2 (two) times daily.   GUAIFENESIN-CODEINE (GUIATUSS AC) 100-10 MG/5ML SYRUP  Take 10 mLs by mouth 4 (four) times daily as needed for cough.   PREDNISONE 5 MG KIT    Take 1 kit (5 mg total) by mouth daily after breakfast. Prednisone 5 mg 6 day dosepack.  Take as directed.   2.  The patient was instructed in symptomatic care and handouts were given. 3.  The patient was told to return if becoming  worse in any way, if no better in one to 2, and given some red flag symptoms that would indicate earlier return.     Reuben Likes, MD 06/16/12 [phone]

## 2012-12-14 ENCOUNTER — Emergency Department: Payer: Self-pay | Admitting: Emergency Medicine

## 2012-12-14 LAB — COMPREHENSIVE METABOLIC PANEL
Albumin: 3.1 g/dL — ABNORMAL LOW (ref 3.4–5.0)
Alkaline Phosphatase: 69 U/L (ref 50–136)
Anion Gap: 4 — ABNORMAL LOW (ref 7–16)
BUN: 19 mg/dL — ABNORMAL HIGH (ref 7–18)
Chloride: 101 mmol/L (ref 98–107)
Creatinine: 0.89 mg/dL (ref 0.60–1.30)
Osmolality: 276 (ref 275–301)
SGOT(AST): 17 U/L (ref 15–37)
SGPT (ALT): 21 U/L (ref 12–78)
Total Protein: 6.2 g/dL — ABNORMAL LOW (ref 6.4–8.2)

## 2012-12-14 LAB — URINALYSIS, COMPLETE
Bilirubin,UR: NEGATIVE
Blood: NEGATIVE
Glucose,UR: NEGATIVE mg/dL (ref 0–75)
Leukocyte Esterase: NEGATIVE
Nitrite: NEGATIVE
Ph: 5 (ref 4.5–8.0)
Protein: NEGATIVE
Specific Gravity: 1.02 (ref 1.003–1.030)
Squamous Epithelial: NONE SEEN
WBC UR: 1 /HPF (ref 0–5)

## 2012-12-14 LAB — CBC
HCT: 30.8 % — ABNORMAL LOW (ref 40.0–52.0)
MCH: 29.6 pg (ref 26.0–34.0)
MCV: 87 fL (ref 80–100)
Platelet: 143 10*3/uL — ABNORMAL LOW (ref 150–440)
RBC: 3.56 10*6/uL — ABNORMAL LOW (ref 4.40–5.90)

## 2012-12-14 LAB — TROPONIN I: Troponin-I: 0.03 ng/mL

## 2013-01-02 ENCOUNTER — Emergency Department (HOSPITAL_COMMUNITY)
Admission: EM | Admit: 2013-01-02 | Discharge: 2013-01-02 | Disposition: A | Discharge status: Nursing Home (Includes ICF) | Payer: Medicare Other | Source: Home / Self Care | Attending: Family Medicine | Admitting: Family Medicine

## 2013-01-02 ENCOUNTER — Encounter (HOSPITAL_COMMUNITY): Payer: Self-pay | Admitting: Emergency Medicine

## 2013-01-02 ENCOUNTER — Emergency Department (INDEPENDENT_AMBULATORY_CARE_PROVIDER_SITE_OTHER): Payer: Medicare Other

## 2013-01-02 ENCOUNTER — Encounter (HOSPITAL_COMMUNITY): Payer: Self-pay | Admitting: Adult Health

## 2013-01-02 ENCOUNTER — Inpatient Hospital Stay (HOSPITAL_COMMUNITY)
Admission: EM | Admit: 2013-01-02 | Discharge: 2013-01-05 | DRG: 194 | Disposition: A | Discharge status: Skilled Nursing Facility | Payer: Medicare Other | Attending: Internal Medicine | Admitting: Internal Medicine

## 2013-01-02 DIAGNOSIS — J189 Pneumonia, unspecified organism: Principal | ICD-10-CM

## 2013-01-02 DIAGNOSIS — E236 Other disorders of pituitary gland: Secondary | ICD-10-CM | POA: Diagnosis present

## 2013-01-02 DIAGNOSIS — E119 Type 2 diabetes mellitus without complications: Secondary | ICD-10-CM

## 2013-01-02 DIAGNOSIS — I10 Essential (primary) hypertension: Secondary | ICD-10-CM | POA: Diagnosis present

## 2013-01-02 DIAGNOSIS — J449 Chronic obstructive pulmonary disease, unspecified: Secondary | ICD-10-CM

## 2013-01-02 DIAGNOSIS — I252 Old myocardial infarction: Secondary | ICD-10-CM

## 2013-01-02 DIAGNOSIS — I359 Nonrheumatic aortic valve disorder, unspecified: Secondary | ICD-10-CM | POA: Diagnosis present

## 2013-01-02 DIAGNOSIS — Z8673 Personal history of transient ischemic attack (TIA), and cerebral infarction without residual deficits: Secondary | ICD-10-CM

## 2013-01-02 DIAGNOSIS — I2510 Atherosclerotic heart disease of native coronary artery without angina pectoris: Secondary | ICD-10-CM

## 2013-01-02 DIAGNOSIS — Z9981 Dependence on supplemental oxygen: Secondary | ICD-10-CM

## 2013-01-02 DIAGNOSIS — Z87891 Personal history of nicotine dependence: Secondary | ICD-10-CM

## 2013-01-02 DIAGNOSIS — Z66 Do not resuscitate: Secondary | ICD-10-CM | POA: Diagnosis not present

## 2013-01-02 DIAGNOSIS — I82409 Acute embolism and thrombosis of unspecified deep veins of unspecified lower extremity: Secondary | ICD-10-CM | POA: Diagnosis present

## 2013-01-02 DIAGNOSIS — E785 Hyperlipidemia, unspecified: Secondary | ICD-10-CM

## 2013-01-02 DIAGNOSIS — Z9861 Coronary angioplasty status: Secondary | ICD-10-CM

## 2013-01-02 DIAGNOSIS — E7800 Pure hypercholesterolemia, unspecified: Secondary | ICD-10-CM | POA: Diagnosis present

## 2013-01-02 DIAGNOSIS — K219 Gastro-esophageal reflux disease without esophagitis: Secondary | ICD-10-CM | POA: Diagnosis present

## 2013-01-02 DIAGNOSIS — J441 Chronic obstructive pulmonary disease with (acute) exacerbation: Secondary | ICD-10-CM | POA: Diagnosis present

## 2013-01-02 DIAGNOSIS — E871 Hypo-osmolality and hyponatremia: Secondary | ICD-10-CM

## 2013-01-02 DIAGNOSIS — I82403 Acute embolism and thrombosis of unspecified deep veins of lower extremity, bilateral: Secondary | ICD-10-CM

## 2013-01-02 DIAGNOSIS — Z8546 Personal history of malignant neoplasm of prostate: Secondary | ICD-10-CM

## 2013-01-02 DIAGNOSIS — D649 Anemia, unspecified: Secondary | ICD-10-CM

## 2013-01-02 HISTORY — DX: Urgency of urination: R39.15

## 2013-01-02 HISTORY — DX: Low back pain: M54.5

## 2013-01-02 HISTORY — DX: Type 2 diabetes mellitus without complications: E11.9

## 2013-01-02 HISTORY — DX: Other chronic pain: G89.29

## 2013-01-02 HISTORY — DX: Low back pain, unspecified: M54.50

## 2013-01-02 HISTORY — DX: Personal history of other diseases of the digestive system: Z87.19

## 2013-01-02 HISTORY — DX: Unspecified chronic bronchitis: J42

## 2013-01-02 HISTORY — DX: Headache: R51

## 2013-01-02 HISTORY — DX: Heart failure, unspecified: I50.9

## 2013-01-02 HISTORY — DX: Personal history of peptic ulcer disease: Z87.11

## 2013-01-02 HISTORY — DX: Anemia, unspecified: D64.9

## 2013-01-02 HISTORY — DX: Unspecified asthma, uncomplicated: J45.909

## 2013-01-02 HISTORY — DX: Unspecified osteoarthritis, unspecified site: M19.90

## 2013-01-02 HISTORY — DX: Basal cell carcinoma of skin, unspecified: C44.91

## 2013-01-02 HISTORY — DX: Inflammatory liver disease, unspecified: K75.9

## 2013-01-02 LAB — BASIC METABOLIC PANEL
BUN: 21 mg/dL (ref 6–23)
Calcium: 10.2 mg/dL (ref 8.4–10.5)
Creatinine, Ser: 1.15 mg/dL (ref 0.50–1.35)
GFR calc Af Amer: 66 mL/min — ABNORMAL LOW (ref >90)
GFR calc non Af Amer: 57 mL/min — ABNORMAL LOW (ref >90)
Glucose, Bld: 115 mg/dL — ABNORMAL HIGH (ref 70–99)

## 2013-01-02 LAB — CBC
HCT: 30.1 % — ABNORMAL LOW (ref 39.0–52.0)
Hemoglobin: 10.8 g/dL — ABNORMAL LOW (ref 13.0–17.0)
MCH: 28.6 pg (ref 26.0–34.0)
MCHC: 35.9 g/dL (ref 30.0–36.0)
MCV: 79.8 fL (ref 78.0–100.0)
RDW: 14.1 % (ref 11.5–15.5)

## 2013-01-02 LAB — GLUCOSE, CAPILLARY: Glucose-Capillary: 150 mg/dL — ABNORMAL HIGH (ref 70–99)

## 2013-01-02 MED ORDER — METHYLPREDNISOLONE SODIUM SUCC 125 MG IJ SOLR
INTRAMUSCULAR | Status: AC
  Filled 2013-01-02: qty 2

## 2013-01-02 MED ORDER — ONDANSETRON HCL 4 MG/2ML IJ SOLN: 4.0000 mg | Freq: Four times a day (QID) | INTRAMUSCULAR | Status: DC | PRN

## 2013-01-02 MED ORDER — SODIUM CHLORIDE 0.9 % IJ SOLN
3.0000 mL | Freq: Two times a day (BID) | INTRAMUSCULAR | Status: DC
  Administered 2013-01-03 – 2013-01-05 (×2): 3 mL via INTRAVENOUS

## 2013-01-02 MED ORDER — VANCOMYCIN HCL IN DEXTROSE 750-5 MG/150ML-% IV SOLN
750.0000 mg | Freq: Two times a day (BID) | INTRAVENOUS | Status: DC
  Administered 2013-01-03 – 2013-01-04 (×5): 750 mg via INTRAVENOUS
  Filled 2013-01-02 (×6): qty 150

## 2013-01-02 MED ORDER — LORATADINE 10 MG PO TABS
10.0000 mg | ORAL_TABLET | Freq: Every day | ORAL | Status: DC
  Administered 2013-01-03 – 2013-01-05 (×3): 10 mg via ORAL
  Filled 2013-01-02 (×3): qty 1

## 2013-01-02 MED ORDER — DEXTROSE 5 % IV SOLN
1.0000 g | INTRAVENOUS | Status: DC
  Administered 2013-01-03: 1 g via INTRAVENOUS
  Filled 2013-01-02 (×2): qty 1

## 2013-01-02 MED ORDER — OMEPRAZOLE 20 MG PO CPDR
80.0000 mg | DELAYED_RELEASE_CAPSULE | Freq: Two times a day (BID) | ORAL | Status: DC
  Administered 2013-01-02 – 2013-01-05 (×7): 80 mg via ORAL
  Filled 2013-01-02 (×8): qty 4

## 2013-01-02 MED ORDER — SODIUM CHLORIDE 0.9 % IJ SOLN
3.0000 mL | Freq: Two times a day (BID) | INTRAMUSCULAR | Status: DC
  Administered 2013-01-03 – 2013-01-05 (×6): 3 mL via INTRAVENOUS

## 2013-01-02 MED ORDER — ISOSORBIDE DINITRATE 30 MG PO TABS
30.0000 mg | ORAL_TABLET | Freq: Three times a day (TID) | ORAL | Status: DC
  Administered 2013-01-03 – 2013-01-05 (×9): 30 mg via ORAL
  Filled 2013-01-02 (×11): qty 1

## 2013-01-02 MED ORDER — DEXTROSE 5 % IV SOLN
500.0000 mg | Freq: Once | INTRAVENOUS | Status: AC
  Administered 2013-01-02: 500 mg via INTRAVENOUS
  Filled 2013-01-02: qty 500

## 2013-01-02 MED ORDER — METHYLPREDNISOLONE SODIUM SUCC 125 MG IJ SOLR: 125.0000 mg | Freq: Once | INTRAMUSCULAR | Status: DC

## 2013-01-02 MED ORDER — FLUTICASONE PROPIONATE HFA 44 MCG/ACT IN AERO
2.0000 | INHALATION_SPRAY | Freq: Two times a day (BID) | RESPIRATORY_TRACT | Status: DC
  Administered 2013-01-02 – 2013-01-05 (×6): 2 via RESPIRATORY_TRACT
  Filled 2013-01-02: qty 10.6

## 2013-01-02 MED ORDER — ACETAMINOPHEN 325 MG PO TABS: 650.0000 mg | ORAL_TABLET | Freq: Four times a day (QID) | ORAL | Status: DC | PRN

## 2013-01-02 MED ORDER — IPRATROPIUM BROMIDE 0.02 % IN SOLN
0.5000 mg | Freq: Once | RESPIRATORY_TRACT | Status: AC
  Administered 2013-01-02: 0.5 mg via RESPIRATORY_TRACT

## 2013-01-02 MED ORDER — MUPIROCIN 2 % EX OINT
1.0000 "application " | TOPICAL_OINTMENT | Freq: Every day | CUTANEOUS | Status: DC | PRN
  Filled 2013-01-02: qty 22

## 2013-01-02 MED ORDER — METOPROLOL TARTRATE 25 MG PO TABS
25.0000 mg | ORAL_TABLET | Freq: Two times a day (BID) | ORAL | Status: DC
  Administered 2013-01-03 – 2013-01-05 (×6): 25 mg via ORAL
  Filled 2013-01-02 (×7): qty 1

## 2013-01-02 MED ORDER — INSULIN ASPART 100 UNIT/ML ~~LOC~~ SOLN
0.0000 [IU] | Freq: Three times a day (TID) | SUBCUTANEOUS | Status: DC
  Administered 2013-01-03: 7 [IU] via SUBCUTANEOUS
  Administered 2013-01-03: 1 [IU] via SUBCUTANEOUS
  Administered 2013-01-04: 3 [IU] via SUBCUTANEOUS
  Administered 2013-01-04: 1 [IU] via SUBCUTANEOUS

## 2013-01-02 MED ORDER — DEXTROSE 5 % IV SOLN
1.0000 g | Freq: Once | INTRAVENOUS | Status: AC
  Administered 2013-01-02: 1 g via INTRAVENOUS
  Filled 2013-01-02: qty 10

## 2013-01-02 MED ORDER — ASPIRIN 81 MG PO TABS: 81.0000 mg | ORAL_TABLET | Freq: Every day | ORAL | Status: DC

## 2013-01-02 MED ORDER — ALBUTEROL SULFATE (5 MG/ML) 0.5% IN NEBU
5.0000 mg | INHALATION_SOLUTION | Freq: Once | RESPIRATORY_TRACT | Status: AC
  Administered 2013-01-02: 5 mg via RESPIRATORY_TRACT

## 2013-01-02 MED ORDER — NITROGLYCERIN 0.4 MG SL SUBL: 0.4000 mg | SUBLINGUAL_TABLET | SUBLINGUAL | Status: DC | PRN

## 2013-01-02 MED ORDER — ATORVASTATIN CALCIUM 80 MG PO TABS
80.0000 mg | ORAL_TABLET | Freq: Every day | ORAL | Status: DC
  Administered 2013-01-03 – 2013-01-04 (×3): 80 mg via ORAL
  Filled 2013-01-02 (×4): qty 1

## 2013-01-02 MED ORDER — SODIUM CHLORIDE 0.9 % IV BOLUS (SEPSIS)
500.0000 mL | Freq: Once | INTRAVENOUS | Status: AC
  Administered 2013-01-02: 500 mL via INTRAVENOUS

## 2013-01-02 MED ORDER — LOSARTAN POTASSIUM 25 MG PO TABS
25.0000 mg | ORAL_TABLET | Freq: Every evening | ORAL | Status: DC
  Administered 2013-01-03: 25 mg via ORAL
  Filled 2013-01-02 (×2): qty 1

## 2013-01-02 MED ORDER — ACETAMINOPHEN 650 MG RE SUPP: 650.0000 mg | Freq: Four times a day (QID) | RECTAL | Status: DC | PRN

## 2013-01-02 MED ORDER — ALBUTEROL SULFATE (5 MG/ML) 0.5% IN NEBU
INHALATION_SOLUTION | RESPIRATORY_TRACT | Status: AC
  Filled 2013-01-02: qty 1

## 2013-01-02 MED ORDER — LEVOFLOXACIN IN D5W 750 MG/150ML IV SOLN
750.0000 mg | INTRAVENOUS | Status: DC
  Administered 2013-01-03: 750 mg via INTRAVENOUS
  Filled 2013-01-02: qty 150

## 2013-01-02 MED ORDER — GABAPENTIN 300 MG PO CAPS
300.0000 mg | ORAL_CAPSULE | Freq: Every day | ORAL | Status: DC
  Administered 2013-01-03 – 2013-01-04 (×3): 300 mg via ORAL
  Filled 2013-01-02 (×5): qty 1

## 2013-01-02 MED ORDER — ASPIRIN EC 81 MG PO TBEC
81.0000 mg | DELAYED_RELEASE_TABLET | Freq: Every day | ORAL | Status: DC
  Administered 2013-01-03 – 2013-01-05 (×3): 81 mg via ORAL
  Filled 2013-01-02 (×3): qty 1

## 2013-01-02 MED ORDER — TRAVOPROST (BAK FREE) 0.004 % OP SOLN
1.0000 [drp] | Freq: Every day | OPHTHALMIC | Status: DC
  Administered 2013-01-03 – 2013-01-04 (×3): 1 [drp] via OPHTHALMIC
  Filled 2013-01-02: qty 2.5

## 2013-01-02 MED ORDER — TICAGRELOR 90 MG PO TABS
90.0000 mg | ORAL_TABLET | Freq: Two times a day (BID) | ORAL | Status: DC
  Administered 2013-01-03 – 2013-01-04 (×4): 90 mg via ORAL
  Filled 2013-01-02 (×5): qty 1

## 2013-01-02 MED ORDER — ONDANSETRON HCL 4 MG PO TABS
4.0000 mg | ORAL_TABLET | Freq: Four times a day (QID) | ORAL | Status: DC | PRN
  Administered 2013-01-03: 4 mg via ORAL
  Filled 2013-01-02: qty 1

## 2013-01-02 MED ORDER — ENOXAPARIN SODIUM 40 MG/0.4ML ~~LOC~~ SOLN
40.0000 mg | SUBCUTANEOUS | Status: DC
  Administered 2013-01-03: 40 mg via SUBCUTANEOUS
  Filled 2013-01-02 (×2): qty 0.4

## 2013-01-02 MED ORDER — PANTOPRAZOLE SODIUM 40 MG PO TBEC: 40.0000 mg | DELAYED_RELEASE_TABLET | Freq: Every day | ORAL | Status: DC

## 2013-01-02 MED ORDER — TERAZOSIN HCL 5 MG PO CAPS
5.0000 mg | ORAL_CAPSULE | Freq: Two times a day (BID) | ORAL | Status: DC
  Administered 2013-01-03 – 2013-01-05 (×6): 5 mg via ORAL
  Filled 2013-01-02 (×7): qty 1

## 2013-01-02 MED ORDER — FERROUS SULFATE 325 (65 FE) MG PO TABS
650.0000 mg | ORAL_TABLET | Freq: Every evening | ORAL | Status: DC
  Administered 2013-01-03 – 2013-01-05 (×3): 650 mg via ORAL
  Filled 2013-01-02 (×3): qty 2

## 2013-01-02 MED ORDER — RANOLAZINE ER 500 MG PO TB12
500.0000 mg | ORAL_TABLET | Freq: Two times a day (BID) | ORAL | Status: DC
  Administered 2013-01-03 (×3): 500 mg via ORAL
  Filled 2013-01-02 (×5): qty 1

## 2013-01-02 NOTE — ED Notes (Signed)
Pt being transported to floor via [place], EMT.  

## 2013-01-02 NOTE — ED Notes (Signed)
Presents with SOB, nasal congestion, cough and intermittent fevers that began one week ago. Pt states, "It does not feel like I can cough much up and it feels tight" reports intermittently increasing home O2 of 2L due to SOB. Sats on 2 L 96%, breathing WNL. Reports feeling tired. Xray completed at [facility]. Shows left lower lobe PNA.

## 2013-01-02 NOTE — H&P (Signed)
[facility] History and Physical  Eugene Bauer OZH:[id number] DOB: Jun 10, 1927 DOA: 01/02/2013  Referring physician: ER physician. PCP: No primary provider on file. [facility]  Chief Complaint: Shortness of breath.  HPI: Eugene Bauer is a 79 y.o. male who has had recent cardiac stents placed 2 weeks ago at [place] presents with complaints of shortness of breath. Patient states since his discharge from the hospital after the stents were placed patient has been having increasing shortness of breath with productive cough. Patient's shortness of breath is present at rest and increases on exertion. Patient lives in the rest home. And over the last 2 weeks patient has been asking the staff to bring his food to his room because of exertional shortness of breath. Denies any chest pain. He has been having some productive cough. In the ER chest x-ray shows left lower lobe infiltrate. Patient has been admitted for pneumonia. In addition patient's lab also show mild hyponatremia. Patient also has been noticing mild swelling of the lower extremities. Patient otherwise denies any nausea vomiting abdominal pain diarrhea dizziness or loss of consciousness.  Review of Systems: As presented in the history of presenting illness, rest negative.  Past Medical History  Diagnosis Date  . Diabetes mellitus   . MI (myocardial infarction)   . Aortic stenosis   . High cholesterol   . COPD (chronic obstructive pulmonary disease)   . Stroke   . Retinal artery occlusion   . Emphysema   . GERD (gastroesophageal reflux disease)   . Coronary artery disease   . Heart murmur   . Prostate cancer   . On home oxygen therapy   . Shortness of breath   . Angina   . Hypertension   . Pneumonia   . Arthritis    Past Surgical History  Procedure Laterality Date  . Rotator cuff repair    . Other surgical history      Bilateral leg stents  . Cardiac catheterization    . Hernia repair    . Coronary angioplasty      Social History:  reports that he quit smoking about 44 years ago. He does not have any smokeless tobacco history on file. He reports that he does not drink alcohol or use illicit drugs. Lives at rest home. where does patient live-- Can do ADLs. Can patient participate in ADLs?  Allergies  Allergen Reactions  . Iodine Shortness Of Breath  . Iohexol      Code: SOB, Desc: ivp dye, iodine, Onset Date: [id number]     Family History  Problem Relation Age of Onset  . Coronary artery disease Brother 1  . Coronary artery disease Sister       Prior to Admission medications   Medication Sig Start Date End Date Taking? Authorizing Provider  acetaminophen (TYLENOL) 500 MG tablet Take 500 mg by mouth every 6 (six) hours as needed for pain.   Yes Historical Provider, MD  aspirin 81 MG tablet Take 81 mg by mouth daily.     Yes Historical Provider, MD  atorvastatin (LIPITOR) 80 MG tablet Take 80 mg by mouth at bedtime.   Yes Historical Provider, MD  cetirizine (ZYRTEC) 10 MG tablet Take 10 mg by mouth daily as needed for allergies.   Yes Historical Provider, MD  ferrous sulfate 325 (65 FE) MG tablet Take 650 mg by mouth every evening.   Yes Historical Provider, MD  gabapentin (NEURONTIN) 300 MG capsule Take 300 mg by mouth at bedtime.  Yes Historical Provider, MD  isosorbide dinitrate (ISORDIL) 30 MG tablet Take 30 mg by mouth 3 (three) times daily.   Yes Historical Provider, MD  losartan (COZAAR) 25 MG tablet Take 25 mg by mouth every evening.   Yes Historical Provider, MD  metFORMIN (GLUCOPHAGE) 500 MG tablet Take 500 mg by mouth 2 (two) times daily with a meal.   Yes Historical Provider, MD  metoprolol (LOPRESSOR) 50 MG tablet Take 25 mg by mouth 2 (two) times daily.   Yes Historical Provider, MD  mometasone [facility]) 220 MCG/INH inhaler Inhale 2 puffs into the lungs at bedtime.    Yes Historical Provider, MD  mupirocin ointment (BACTROBAN) 2 % Apply 1 application topically daily as needed  (face).   Yes Historical Provider, MD  nitroGLYCERIN (NITROSTAT) 0.4 MG SL tablet Place 0.4 mg under the tongue every 5 (five) minutes as needed for chest pain.   Yes Historical Provider, MD  omeprazole (PRILOSEC) 40 MG capsule Take 80 mg by mouth 2 (two) times daily.    Yes Historical Provider, MD  ranolazine (RANEXA) 500 MG 12 hr tablet Take 500 mg by mouth 2 (two) times daily.     Yes Historical Provider, MD  terazosin (HYTRIN) 5 MG capsule Take 5 mg by mouth 2 (two) times daily.     Yes Historical Provider, MD  Ticagrelor (BRILINTA) 90 MG TABS tablet Take 90 mg by mouth 2 (two) times daily.   Yes Historical Provider, MD  Travoprost, BAK Free, (TRAVATAN) 0.004 % SOLN ophthalmic solution Place 1 drop into both eyes at bedtime.     Yes Historical Provider, MD   Physical Exam: Filed Vitals:   01/02/13 1631 01/02/13 1859 01/02/13 1921  BP: 157/77 151/58   Pulse: 86 80   Temp: 97.7 F (36.5 C) 98.2 F (36.8 C)   TempSrc: Oral Oral   Resp: 16 18   SpO2: 96% 97% 97%     General:  Well-developed well-nourished.  Eyes: Anicteric no pallor.  ENT: No discharge from ears eyes nose or mouth.  Neck: No mass felt.  Cardiovascular: S1-S2 heard.  Respiratory: No rhonchi or crepitations.  Abdomen: Soft nontender bowel sounds present.  Skin: No rash.  Musculoskeletal: Patient has small bruise on the left middle toe. There is bilateral lower extremity edema.  Psychiatric: Appears normal.  Neurologic: Alert and oriented to time place and person. Moves all extremities.  Labs on Admission:  Basic Metabolic Panel:  Recent Labs Lab 01/02/13 1635  NA 129*  K 4.9  CL 92*  CO2 30  GLUCOSE 115*  BUN 21  CREATININE 1.15  CALCIUM 10.2   Liver Function Tests: No results found for this basename: AST, ALT, ALKPHOS, BILITOT, PROT, ALBUMIN,  in the last 168 hours No results found for this basename: LIPASE, AMYLASE,  in the last 168 hours No results found for this basename: AMMONIA,  in  the last 168 hours CBC:  Recent Labs Lab 01/02/13 1635  WBC 7.2  HGB 10.8*  HCT 30.1*  MCV 79.8  PLT 279   Cardiac Enzymes: No results found for this basename: CKTOTAL, CKMB, CKMBINDEX, TROPONINI,  in the last 168 hours  BNP (last 3 results) No results found for this basename: PROBNP,  in the last 8760 hours CBG: No results found for this basename: GLUCAP,  in the last 168 hours  Radiological Exams on Admission: Dg Chest 2 View  01/02/2013  *RADIOLOGY REPORT*  Clinical Data: Congestion, cough and fever.  CHEST - 2 VIEW  Comparison: 06/15/2012 and prior chest radiographs dating back to 11/02/2007  Findings: The cardiomediastinal silhouette is unremarkable. Left lower lobe airspace disease is compatible with pneumonia. Mild peribronchial thickening is unchanged. There is no evidence of pleural effusion, pneumothorax, pulmonary mass or pulmonary edema.  IMPRESSION: Left lower lobe airspace disease/pneumonia.  Radiographic follow up to resolution is recommended.   Original Report Authenticated By: Harmon Pier, M.D.     EKG: Independently reviewed. Normal sinus rhythm with PVCs.  Assessment/Plan Principal Problem:   Healthcare-associated pneumonia Active Problems:   CAD (coronary artery disease)   COPD (chronic obstructive pulmonary disease)   Hyponatremia   Anemia   Diabetes mellitus   Hyperlipidemia   1. Pneumonia - since patient was admitted recently to hospital for cardiac catheter and stents patient will be treated as health care associated pneumonia. Patient has been placed on vancomycin cefepime and Levaquin. 2. CAD status post stenting - denies any chest pain now but given the exertional shortness of breath and mild lower extremity edema I suspect patient may be having a component of CHF. Check BNP closely follow intake output and respiratory status. Continue antiplatelet agents. Check troponins. 3. Lower extremity edema - probably from possible developing CHF. Check  Dopplers to rule out DVT. 4. Hyponatremia - patient did receive fluids in the ER. Given patient's lower extremity edema I will hold off further fluids. Check BNP. Check urine sodium and closely follow metabolic panel and further recommendations based on sodium trends and labs ordered. 5. Diabetes mellitus type 2 - patient has been placed on sliding-scale coverage. 6. Chronic anemia - follow CBC. 7. COPD - continue present medications. 8. Hyperlipidemia - continue statins.    Code Status: Full code.  Family Communication: None.  Disposition Plan: Admit to inpatient.    Ace Bergfeld N. [facility] Pager [phone].  If 7PM-7AM, please contact night-coverage [email or web address] Password [facility] 01/02/2013, 8:28 PM

## 2013-01-02 NOTE — ED Notes (Signed)
Unable to discharge-breathing treatment in progress

## 2013-01-02 NOTE — ED Provider Notes (Signed)
History     CSN: [id number]  Arrival date & time 01/02/13  1619   First MD Initiated Contact with Patient 01/02/13 1905      Chief Complaint  Patient presents with  . Shortness of Breath    (Consider location/radiation/quality/duration/timing/severity/associated sxs/prior treatment) Patient is a 79 y.o. male presenting with shortness of breath.  Shortness of Breath  Pt with extensive medical history managed by the [facility] hospital has had cough and SOB for the last week with occasional fevers. He states symptoms are worse at night. HE was seen at [facility], given neb which improved SOB but CXR showed LLL pneumonia. Sent to the ED for further eval. He also reports a fall at home prior to symptom onset, seen at [facility] and had Chest CT which he and family members report was negative.   Past Medical History  Diagnosis Date  . Diabetes mellitus   . MI (myocardial infarction)   . Aortic stenosis   . High cholesterol   . COPD (chronic obstructive pulmonary disease)   . Stroke   . Retinal artery occlusion   . Emphysema   . GERD (gastroesophageal reflux disease)   . Coronary artery disease   . Heart murmur   . Prostate cancer   . On home oxygen therapy   . Shortness of breath   . Angina   . Hypertension   . Pneumonia   . Arthritis     Past Surgical History  Procedure Laterality Date  . Rotator cuff repair    . Other surgical history      Bilateral leg stents  . Cardiac catheterization    . Hernia repair      Family History  Problem Relation Age of Onset  . Coronary artery disease Brother 82  . Coronary artery disease Sister     History  Substance Use Topics  . Smoking status: Former Smoker    Quit date: 08/30/1968  . Smokeless tobacco: Not on file  . Alcohol Use: No      Review of Systems  Respiratory: Positive for shortness of breath.    All other systems reviewed and are negative except as noted in HPI.   Allergies  Iodine and Iohexol  Home Medications    Current Outpatient Rx  Name  Route  Sig  Dispense  Refill  . acetaminophen (TYLENOL) 500 MG tablet   Oral   Take 500 mg by mouth every 6 (six) hours as needed for pain.         Marland Kitchen aspirin 81 MG tablet   Oral   Take 81 mg by mouth daily.           Marland Kitchen atorvastatin (LIPITOR) 80 MG tablet   Oral   Take 80 mg by mouth at bedtime.         . cetirizine (ZYRTEC) 10 MG tablet   Oral   Take 10 mg by mouth daily as needed for allergies.         . ferrous sulfate 325 (65 FE) MG tablet   Oral   Take 650 mg by mouth every evening.         . gabapentin (NEURONTIN) 300 MG capsule   Oral   Take 300 mg by mouth at bedtime.           . isosorbide dinitrate (ISORDIL) 30 MG tablet   Oral   Take 30 mg by mouth 3 (three) times daily.         Marland Kitchen losartan (  COZAAR) 25 MG tablet   Oral   Take 25 mg by mouth every evening.         . metFORMIN (GLUCOPHAGE) 500 MG tablet   Oral   Take 500 mg by mouth 2 (two) times daily with a meal.         . metoprolol (LOPRESSOR) 50 MG tablet   Oral   Take 25 mg by mouth 2 (two) times daily.         . mometasone (ASMANEX) 220 MCG/INH inhaler   Inhalation   Inhale 2 puffs into the lungs at bedtime.          . mupirocin ointment (BACTROBAN) 2 %   Topical   Apply 1 application topically daily as needed (face).         . nitroGLYCERIN (NITROSTAT) 0.4 MG SL tablet   Sublingual   Place 0.4 mg under the tongue every 5 (five) minutes as needed for chest pain.         Marland Kitchen omeprazole (PRILOSEC) 40 MG capsule   Oral   Take 80 mg by mouth 2 (two) times daily.          . ranolazine (RANEXA) 500 MG 12 hr tablet   Oral   Take 500 mg by mouth 2 (two) times daily.           Marland Kitchen terazosin (HYTRIN) 5 MG capsule   Oral   Take 5 mg by mouth 2 (two) times daily.           . Ticagrelor (BRILINTA) 90 MG TABS tablet   Oral   Take 90 mg by mouth 2 (two) times daily.         . Travoprost, BAK Free, (TRAVATAN) 0.004 % SOLN ophthalmic  solution   Both Eyes   Place 1 drop into both eyes at bedtime.             BP 151/58  Pulse 80  Temp(Src) 98.2 F (36.8 C) (Oral)  Resp 18  SpO2 97%  Physical Exam  Nursing note and vitals reviewed. Constitutional: He is oriented to person, place, and time. He appears well-developed and well-nourished.  HENT:  Head: Normocephalic and atraumatic.  Eyes: EOM are normal. Pupils are equal, round, and reactive to light.  Neck: Normal range of motion. Neck supple.  Cardiovascular: Normal rate, normal heart sounds and intact distal pulses.   Pulmonary/Chest: Effort normal and breath sounds normal. He has no wheezes. He has no rales.  Abdominal: Bowel sounds are normal. He exhibits no distension. There is no tenderness.  Musculoskeletal: Normal range of motion. He exhibits no edema and no tenderness.  Neurological: He is alert and oriented to person, place, and time. He has normal strength. No cranial nerve deficit or sensory deficit.  Skin: Skin is warm and dry. No rash noted.  Psychiatric: He has a normal mood and affect.    ED Course  Procedures (including critical care time)  Labs Reviewed  CBC - Abnormal; Notable for the following:    RBC 3.77 (*)    Hemoglobin 10.8 (*)    HCT 30.1 (*)    All other components within normal limits  BASIC METABOLIC PANEL - Abnormal; Notable for the following:    Sodium 129 (*)    Chloride 92 (*)    Glucose, Bld 115 (*)    GFR calc non Af Amer 57 (*)    GFR calc Af Amer 66 (*)    All other components within  normal limits  POCT I-STAT TROPONIN I   Dg Chest 2 View  01/02/2013  *RADIOLOGY REPORT*  Clinical Data: Congestion, cough and fever.  CHEST - 2 VIEW  Comparison: 06/15/2012 and prior chest radiographs dating back to 11/02/2007  Findings: The cardiomediastinal silhouette is unremarkable. Left lower lobe airspace disease is compatible with pneumonia. Mild peribronchial thickening is unchanged. There is no evidence of pleural effusion,  pneumothorax, pulmonary mass or pulmonary edema.  IMPRESSION: Left lower lobe airspace disease/pneumonia.  Radiographic follow up to resolution is recommended.   Original Report Authenticated By: Harmon Pier, M.D.      1. CAP (community acquired pneumonia)   2. Hyponatremia       MDM  Lungs clear now. Labs ordered in triage reviewed, no leukocytosis but has moderate hyponatremia. Will give IVF, start Abx and admit for further eval.         Jay Haskew B. Bernette Mayers, MD 01/02/13 1610

## 2013-01-02 NOTE — ED Provider Notes (Signed)
History     CSN: [id number]  Arrival date & time 01/02/13  1346   First MD Initiated Contact with Patient 01/02/13 1429      Chief Complaint  Patient presents with  . URI    (Consider location/radiation/quality/duration/timing/severity/associated sxs/prior treatment) Patient is a 79 y.o. male presenting with URI. The history is provided by the patient and a relative.  URI Presenting symptoms: congestion, cough and rhinorrhea   Severity:  Moderate Duration:  1 week Progression:  Waxing and waning Chronicity:  Chronic Worsened by:  Certain positions Associated symptoms: wheezing   Risk factors: chronic respiratory disease     Past Medical History  Diagnosis Date  . Diabetes mellitus   . MI (myocardial infarction)   . Aortic stenosis   . High cholesterol   . COPD (chronic obstructive pulmonary disease)   . Stroke   . Retinal artery occlusion   . Emphysema   . GERD (gastroesophageal reflux disease)   . Coronary artery disease   . Heart murmur   . Prostate cancer   . On home oxygen therapy   . Shortness of breath   . Angina   . Hypertension   . Pneumonia   . Arthritis     Past Surgical History  Procedure Laterality Date  . Rotator cuff repair    . Other surgical history      Bilateral leg stents  . Cardiac catheterization    . Hernia repair      Family History  Problem Relation Age of Onset  . Coronary artery disease Brother 74  . Coronary artery disease Sister     History  Substance Use Topics  . Smoking status: Former Smoker    Quit date: 08/30/1968  . Smokeless tobacco: Not on file  . Alcohol Use: No      Review of Systems  Constitutional: Negative.   HENT: Positive for congestion and rhinorrhea.   Respiratory: Positive for cough, shortness of breath and wheezing.   Cardiovascular: Negative.  Negative for palpitations and leg swelling.  Gastrointestinal: Negative.     Allergies  Iodine and Iohexol  Home Medications   Current  Outpatient Rx  Name  Route  Sig  Dispense  Refill  . albuterol (PROVENTIL HFA;VENTOLIN HFA) 108 (90 BASE) MCG/ACT inhaler   Inhalation   Inhale 2 puffs into the lungs every 6 (six) hours as needed. For shortness of breath          . amoxicillin-clavulanate (AUGMENTIN) 875-125 MG per tablet   Oral   Take 1 tablet by mouth 2 (two) times daily.   20 tablet   0   . aspirin 81 MG tablet   Oral   Take 81 mg by mouth daily.           Marland Kitchen dipyridamole-aspirin (AGGRENOX) 25-200 MG per 12 hr capsule   Oral   Take 1 capsule by mouth 2 (two) times daily.           Marland Kitchen gabapentin (NEURONTIN) 300 MG capsule   Oral   Take 300 mg by mouth at bedtime.           Marland Kitchen guaiFENesin-codeine (GUIATUSS AC) 100-10 MG/5ML syrup   Oral   Take 10 mLs by mouth 4 (four) times daily as needed for cough.   120 mL   0   . HYDROCODONE-ACETAMINOPHEN PO   Oral   Take 1 tablet by mouth every 6 (six) hours as needed. For pain         .  metFORMIN (GLUCOPHAGE) 850 MG tablet   Oral   Take 850 mg by mouth 2 (two) times daily with a meal.          . metoprolol tartrate (LOPRESSOR) 25 MG tablet   Oral   Take 25 mg by mouth 2 (two) times daily.           . mometasone (ASMANEX) 220 MCG/INH inhaler   Inhalation   Inhale 2 puffs into the lungs daily.           . nitroGLYCERIN (NITRODUR - DOSED IN MG/24 HR) 0.6 mg/hr   Transdermal   Place 1 patch (0.6 mg total) onto the skin daily.   60 patch   0   . omeprazole (PRILOSEC) 40 MG capsule   Oral   Take 40 mg by mouth 2 (two) times daily. 2 tabs at breakfast and dinner          . PredniSONE 5 MG KIT   Oral   Take 1 kit (5 mg total) by mouth daily after breakfast. Prednisone 5 mg 6 day dosepack.  Take as directed.   1 kit   0   . ranolazine (RANEXA) 500 MG 12 hr tablet   Oral   Take 500 mg by mouth 2 (two) times daily.           Marland Kitchen terazosin (HYTRIN) 5 MG capsule   Oral   Take 5 mg by mouth 2 (two) times daily.           Marland Kitchen tiotropium  (SPIRIVA) 18 MCG inhalation capsule   Inhalation   Place 18 mcg into inhaler and inhale daily.           . Travoprost, BAK Free, (TRAVATAN) 0.004 % SOLN ophthalmic solution   Both Eyes   Place 1 drop into both eyes at bedtime.             BP 138/95  Pulse 83  Temp(Src) 98 F (36.7 C) (Oral)  Resp 16  SpO2 97%  Physical Exam  Nursing note and vitals reviewed. Constitutional: He is oriented to person, place, and time. He appears well-developed and well-nourished. No distress.  HENT:  Head: Normocephalic.  Mouth/Throat: Oropharynx is clear and moist.  Neck: Normal range of motion. Neck supple.  Pulmonary/Chest: He has decreased breath sounds. He has wheezes.  Neurological: He is alert and oriented to person, place, and time.  Skin: Skin is warm and dry.    ED Course  Procedures (including critical care time)  Labs Reviewed - No data to display Dg Chest 2 View  01/02/2013  *RADIOLOGY REPORT*  Clinical Data: Congestion, cough and fever.  CHEST - 2 VIEW  Comparison: 06/15/2012 and prior chest radiographs dating back to 11/02/2007  Findings: The cardiomediastinal silhouette is unremarkable. Left lower lobe airspace disease is compatible with pneumonia. Mild peribronchial thickening is unchanged. There is no evidence of pleural effusion, pneumothorax, pulmonary mass or pulmonary edema.  IMPRESSION: Left lower lobe airspace disease/pneumonia.  Radiographic follow up to resolution is recommended.   Original Report Authenticated By: Harmon Pier, M.D.      1. Community acquired pneumonia   2. COPD, severe       MDM  X-rays reviewed and report per radiologist.  Sent for obs and iv meds for pna in pt with end stage lung disease.         Linna Hoff, MD 01/02/13 1540

## 2013-01-02 NOTE — Progress Notes (Addendum)
ANTIBIOTIC CONSULT NOTE - INITIAL  Pharmacy Consult for Vancomycin Indication: rule out pneumonia  Allergies  Allergen Reactions  . Iodine Shortness Of Breath  . Iohexol      Code: SOB, Desc: ivp dye, iodine, Onset Date: [id number]     Patient Measurements: Height: 5\' 8"  (172.7 cm) (per patient report) Weight: 162 lb (73.483 kg) (per patient report) IBW/kg (Calculated) : 68.4 Adjusted Body Weight: 73.4 kg   Vital Signs: Temp: 98.2 F (36.8 C) (05/06 2037) Temp src: Oral (05/06 2037) BP: 151/58 mmHg (05/06 1859) Pulse Rate: 88 (05/06 2037) Intake/Output from previous day:   Intake/Output from this shift:    Labs:  Recent Labs  01/02/13 1635  WBC 7.2  HGB 10.8*  PLT 279  CREATININE 1.15   Estimated Creatinine Clearance: 47.1 ml/min (by C-G formula based on Cr of 1.15). No results found for this basename: VANCOTROUGH, VANCOPEAK, VANCORANDOM, GENTTROUGH, GENTPEAK, GENTRANDOM, TOBRATROUGH, TOBRAPEAK, TOBRARND, AMIKACINPEAK, AMIKACINTROU, AMIKACIN,  in the last 72 hours   Microbiology: No results found for this or any previous visit (from the past 720 hour(s)).  Medical History: Past Medical History  Diagnosis Date  . Diabetes mellitus   . MI (myocardial infarction)   . Aortic stenosis   . High cholesterol   . COPD (chronic obstructive pulmonary disease)   . Stroke   . Retinal artery occlusion   . Emphysema   . GERD (gastroesophageal reflux disease)   . Coronary artery disease   . Heart murmur   . Prostate cancer   . On home oxygen therapy   . Shortness of breath   . Angina   . Hypertension   . Pneumonia   . Arthritis     Medications:  Scheduled:  . omeprazole  80 mg Oral BID AC  . vancomycin  750 mg Intravenous Q12H   Assessment: 79 yo male admitted with SOB and occasional fever.   CXR c/w LLL pneumonia.  Extensive PMH including diabetes, CAD, hyperlipidemia, COPD, GERD, HTN and stroke.  Received azithromycin and ceftriaxone in ED, and pharmacy  asked to begin empiric vancomycin.  Estimated CrCl ~ 47 ml/min.  Goal of Therapy:  Vancomycin trough level 15-20 mcg/ml  Plan:  1. Vancomycin 750 mg IV q 12 hrs.  First dose now. 2. Vancomycin trough at steady state as needed. 3. Cefepime 1g IV q 24 hrs for now. 4. Levaquin 750 mg IV q 48 hrs. 5. Will watch renal function closely and adjust antibiotics as needed.  Tad Moore, BCPS  Clinical Pharmacist Pager [phone]  01/02/2013 8:55 PM

## 2013-01-02 NOTE — ED Notes (Signed)
Reports symptoms for one week.  Patient has had chest and nasal congestion.  Initially had fever, but that stopped.  Over the weekend felt like congestion was breaking up, but now feels tighter now.

## 2013-01-02 NOTE — ED Notes (Signed)
Report called to floor nurse, Lissa Hoard, RN. RN has no further questions upon report given. Nurse notified vancomycin has not been sent from pharmacy but pt would be given omeprazole before being brought to the floor.

## 2013-01-03 ENCOUNTER — Inpatient Hospital Stay (HOSPITAL_COMMUNITY): Payer: Medicare Other

## 2013-01-03 ENCOUNTER — Encounter (HOSPITAL_COMMUNITY): Payer: Self-pay | Admitting: General Practice

## 2013-01-03 DIAGNOSIS — R609 Edema, unspecified: Secondary | ICD-10-CM

## 2013-01-03 DIAGNOSIS — I82409 Acute embolism and thrombosis of unspecified deep veins of unspecified lower extremity: Secondary | ICD-10-CM

## 2013-01-03 LAB — GLUCOSE, CAPILLARY
Glucose-Capillary: 103 mg/dL — ABNORMAL HIGH (ref 70–99)
Glucose-Capillary: 183 mg/dL — ABNORMAL HIGH (ref 70–99)
Glucose-Capillary: 275 mg/dL — ABNORMAL HIGH (ref 70–99)

## 2013-01-03 LAB — CBC
MCH: 28.6 pg (ref 26.0–34.0)
MCV: 79.5 fL (ref 78.0–100.0)
Platelets: 228 10*3/uL (ref 150–400)
RBC: 2.97 MIL/uL — ABNORMAL LOW (ref 4.22–5.81)

## 2013-01-03 LAB — BASIC METABOLIC PANEL
BUN: 15 mg/dL (ref 6–23)
CO2: 29 mEq/L (ref 19–32)
Calcium: 9.1 mg/dL (ref 8.4–10.5)
Glucose, Bld: 110 mg/dL — ABNORMAL HIGH (ref 70–99)
Sodium: 130 mEq/L — ABNORMAL LOW (ref 135–145)

## 2013-01-03 LAB — MRSA PCR SCREENING: MRSA by PCR: NEGATIVE

## 2013-01-03 MED ORDER — IPRATROPIUM BROMIDE 0.02 % IN SOLN
0.5000 mg | Freq: Four times a day (QID) | RESPIRATORY_TRACT | Status: DC
  Administered 2013-01-04 – 2013-01-05 (×6): 0.5 mg via RESPIRATORY_TRACT
  Filled 2013-01-03 (×5): qty 2.5

## 2013-01-03 MED ORDER — GLUCERNA SHAKE PO LIQD
237.0000 mL | ORAL | Status: DC
  Administered 2013-01-03 – 2013-01-05 (×3): 237 mL via ORAL

## 2013-01-03 MED ORDER — RIVAROXABAN 15 MG PO TABS
15.0000 mg | ORAL_TABLET | Freq: Two times a day (BID) | ORAL | Status: DC
  Administered 2013-01-03 – 2013-01-05 (×5): 15 mg via ORAL
  Filled 2013-01-03 (×6): qty 1

## 2013-01-03 MED ORDER — DEXTROSE 5 % IV SOLN
1.0000 g | Freq: Three times a day (TID) | INTRAVENOUS | Status: DC
  Administered 2013-01-03 – 2013-01-04 (×4): 1 g via INTRAVENOUS
  Filled 2013-01-03 (×5): qty 1

## 2013-01-03 MED ORDER — IPRATROPIUM BROMIDE 0.02 % IN SOLN
0.5000 mg | RESPIRATORY_TRACT | Status: DC
  Administered 2013-01-03 (×2): 0.5 mg via RESPIRATORY_TRACT
  Filled 2013-01-03 (×3): qty 2.5

## 2013-01-03 MED ORDER — TECHNETIUM TC 99M DIETHYLENETRIAME-PENTAACETIC ACID: 40.0000 | Freq: Once | INTRAVENOUS | Status: AC | PRN

## 2013-01-03 MED ORDER — ALBUTEROL SULFATE (5 MG/ML) 0.5% IN NEBU
2.5000 mg | INHALATION_SOLUTION | RESPIRATORY_TRACT | Status: DC
  Administered 2013-01-03 (×2): 2.5 mg via RESPIRATORY_TRACT
  Filled 2013-01-03 (×3): qty 0.5

## 2013-01-03 MED ORDER — DOCUSATE SODIUM 100 MG PO CAPS
100.0000 mg | ORAL_CAPSULE | Freq: Two times a day (BID) | ORAL | Status: DC
  Administered 2013-01-03 – 2013-01-04 (×3): 100 mg via ORAL
  Filled 2013-01-03 (×6): qty 1

## 2013-01-03 MED ORDER — LEVOFLOXACIN IN D5W 750 MG/150ML IV SOLN
750.0000 mg | INTRAVENOUS | Status: DC
  Administered 2013-01-04 – 2013-01-05 (×2): 750 mg via INTRAVENOUS
  Filled 2013-01-03 (×2): qty 150

## 2013-01-03 MED ORDER — PREDNISONE 50 MG PO TABS
50.0000 mg | ORAL_TABLET | Freq: Every day | ORAL | Status: DC
  Administered 2013-01-04: 50 mg via ORAL
  Filled 2013-01-03 (×2): qty 1

## 2013-01-03 MED ORDER — ALBUTEROL SULFATE (5 MG/ML) 0.5% IN NEBU: 2.5000 mg | INHALATION_SOLUTION | RESPIRATORY_TRACT | Status: DC | PRN

## 2013-01-03 MED ORDER — RIVAROXABAN 20 MG PO TABS: 20.0000 mg | ORAL_TABLET | Freq: Every day | ORAL | Status: DC

## 2013-01-03 MED ORDER — ALBUTEROL SULFATE (5 MG/ML) 0.5% IN NEBU
2.5000 mg | INHALATION_SOLUTION | Freq: Four times a day (QID) | RESPIRATORY_TRACT | Status: DC
  Administered 2013-01-04 – 2013-01-05 (×6): 2.5 mg via RESPIRATORY_TRACT
  Filled 2013-01-03 (×5): qty 0.5

## 2013-01-03 MED ORDER — BIOTENE DRY MOUTH MT LIQD
15.0000 mL | Freq: Two times a day (BID) | OROMUCOSAL | Status: DC
  Administered 2013-01-03 – 2013-01-05 (×5): 15 mL via OROMUCOSAL

## 2013-01-03 MED ORDER — METHYLPREDNISOLONE SODIUM SUCC 125 MG IJ SOLR
80.0000 mg | Freq: Once | INTRAMUSCULAR | Status: AC
  Administered 2013-01-03: 80 mg via INTRAVENOUS
  Filled 2013-01-03: qty 1.28

## 2013-01-03 NOTE — Progress Notes (Signed)
*  Preliminary Results* Bilateral lower extremity venous duplex completed. Bilateral lower extremities are positive for deep vein thrombosis involving the right posterior tibial and left peroneal veins. There is no evidence of Baker's cyst bilaterally.  Preliminary results discussed with [place], RN.  01/03/2013 1:06 PM Gertie Fey, RDMS, RDCS

## 2013-01-03 NOTE — Evaluation (Signed)
Physical Therapy Evaluation Patient Details Name: Eugene Bauer MRN: [id number] DOB: [birth year] Today's Date: 01/03/2013 Time: 4098-1191 PT Time Calculation (min): 26 min  PT Assessment / Plan / Recommendation Clinical Impression  pt presents with PNA, SOB and recent cardiac stents.  pt needs max encouragement for participation and only agreeable to OOB to recliner.  pt states that at [facility] they do not offer personal A and he may need to hire Aide.  If pt capable of hiring personal care Aide for near 24hrs or pt's family able to provide A, then pt can return to Independent Living.  Otherwise will need SNF level of care as safest D/C option.      PT Assessment  Patient needs continued PT services    Follow Up Recommendations  SNF    Does the patient have the potential to tolerate intense rehabilitation      Barriers to Discharge Decreased caregiver support      Equipment Recommendations  Wheelchair (measurements PT);Wheelchair cushion (measurements PT) (3-in-1)    Recommendations for Other Services OT consult   Frequency Min 3X/week    Precautions / Restrictions Precautions Precautions: Fall Restrictions Weight Bearing Restrictions: No   Pertinent Vitals/Pain Denies pain.        Mobility  Bed Mobility Bed Mobility: Supine to Sit;Sitting - Scoot to Edge of Bed Supine to Sit: 4: Min guard;With rails;HOB elevated Sitting - Scoot to Edge of Bed: 5: Supervision Details for Bed Mobility Assistance: cues for encouragement.   Transfers Transfers: Sit to Stand;Stand to [employer] Transfers Sit to Stand: 4: Min assist;With upper extremity assist;From bed Stand to Sit: 4: Min assist;With upper extremity assist;To chair/3-in-1;With armrests Stand Pivot Transfers: 4: Min assist;With armrests Details for Transfer Assistance: pt demos good use of armrests, but remains in flexed position.  pt moves slowly and needs A for balance.   Ambulation/Gait Ambulation/Gait  Assistance: Not tested (comment) Stairs: No Wheelchair Mobility Wheelchair Mobility: No    Exercises     PT Diagnosis: Difficulty walking;Generalized weakness (Decondition/Debility)  PT Problem List: Decreased strength;Decreased activity tolerance;Decreased balance;Decreased mobility;Decreased knowledge of use of DME;Cardiopulmonary status limiting activity PT Treatment Interventions: DME instruction;Gait training;Functional mobility training;Therapeutic activities;Therapeutic exercise;Balance training;Patient/family education   PT Goals Acute Rehab PT Goals PT Goal Formulation: With patient Time For Goal Achievement: 01/17/13 Potential to Achieve Goals: Fair Pt will go Supine/Side to Sit: with modified independence PT Goal: Supine/Side to Sit - Progress: Goal set today Pt will go Sit to Supine/Side: with modified independence PT Goal: Sit to Supine/Side - Progress: Goal set today Pt will go Sit to Stand: with modified independence PT Goal: Sit to Stand - Progress: Goal set today Pt will go Stand to Sit: with modified independence PT Goal: Stand to Sit - Progress: Goal set today Pt will Ambulate: 51 - 150 feet;with supervision;with rolling walker PT Goal: Ambulate - Progress: Goal set today  Visit Information  Last PT Received On: 01/03/13 Assistance Needed: +1    Subjective Data  Subjective: I don't want to walk today.   Patient Stated Goal: None stated.     Prior Functioning  Home Living Lives With: Alone Type of Home: Independent living facility Home Access: Level entry Home Layout: One level Home Adaptive Equipment: Walker - rolling;Straight cane Additional Comments: pt lives at [facility].  Per pt they only provide A for meals and cleaning.   Prior Function Level of Independence: Needs assistance Needs Assistance: Meal Prep;Light Housekeeping Meal Prep: Total Light Housekeeping: Total  Able to Take Stairs?: No Driving: No Vocation:  Retired Musician: Surveyor, mining Arousal/Alertness: Awake/alert Behavior During Therapy: WFL for tasks assessed/performed Overall Cognitive Status: Within Functional Limits for tasks assessed    Extremity/Trunk Assessment Right Lower Extremity Assessment RLE ROM/Strength/Tone: Deficits RLE ROM/Strength/Tone Deficits: Generally weak and deconditioned.   RLE Sensation: WFL - Light Touch Left Lower Extremity Assessment LLE ROM/Strength/Tone: Deficits LLE ROM/Strength/Tone Deficits: Generally weak and deconditioned.   LLE Sensation: WFL - Light Touch Trunk Assessment Trunk Assessment: Kyphotic   Balance Balance Balance Assessed: No  End of Session PT - End of Session Equipment Utilized During Treatment: Oxygen Activity Tolerance: Patient limited by fatigue (Self-limits mobility) Patient left: in chair;with call bell/phone within reach Nurse Communication: Mobility status  GP     Sunny Schlein,  [phone] 01/03/2013, 11:34 AM

## 2013-01-03 NOTE — Progress Notes (Signed)
[facility] PROGRESS NOTE  Eugene Bauer BMW:[id number] DOB: 01/09/1927 DOA: 01/02/2013 PCP: [facility], MD  Eugene Bauer is a 79 y.o. male who has had cardiac stents placed 2 weeks ago at [place] presents with complaints of shortness of breath. Patient states since his discharge from the hospital after the stents were placed patient has been having increasing shortness of breath with productive cough. Patient's shortness of breath is present at rest and increases on exertion. Patient lives in the rest home, [place]. And over the last 2 weeks patient has been asking the staff to bring his food to his room because of exertional shortness of breath. Denies any chest pain. In the ER chest x-ray shows left lower lobe infiltrate. Patient has been admitted for pneumonia. In addition patient's lab also show mild hyponatremia. Patient also has been noticing mild swelling of the lower extremities. Patient otherwise denies any nausea vomiting abdominal pain diarrhea dizziness or loss of consciousness.  Assessment/Plan:  1. Left Lower Lobe Pneumonia -Health care associated PNA-had a recent PTCA -Add nebulizer treatments: Atrovent, Albuterol  -Continue Vancomycin, Cefepime, Levaquin -Continue 2 liters O2, wean as tolerated   -Incentive spirometry   2. Hyponatremia -Likely due to IVF received  -Na 130 01/03/13 -Serial BMPs to identify trend   3. Lower Extremity Edema  -Dopplers show bilateral DVT -Need to discuss anticoagulation with Ebony Cargo of Cardiology at [facility] (patient had stents placed recently) -Maryann Conners, but also currently on Aspirin and Brilenta       -Possibly mild effect from CHF -BNP 981  -CXR does not show CHF exacerbation  4. CAD/CHF -s/p stenting 2 weeks ago at [facility] hospital  -Continue aspirin, isordil, losartan, metoprolol, ticagrelor, ranolazine   5. GERD -Stable  -hx of ulcers -Continue omeprazole, zofran   6. Diabetes Mellitus Type  II -Moderately well controlled -Serial blood glucose measures -Sliding scale insulin   7. COPD with acute exacerbation       -with Wheeze -Continue nebs, IS, steroids  8. Acute on Chronic Anemia -Downward trend: 10.8 (5/6); 8.5 (5/7) -Serial CBC to look for decrease in hemoglobin  -guiac stool  9. Hyperlipidemia  -Stable  -Continue lipitor   10. Hx of Prostate Cancer  -Stable  -Continue terazosin    Code Status: DNR-after d/w Daughter Family Communication: 1 Daughter at bedside Disposition Plan: Inpatient    Consultants:  None  Procedures:  None  Antibiotics:  Vancomycin   Cefepime  Levaquin   HPI/Subjective: Patient reports feeling weak but states he feels his breathing has improved. No pain complaints. Tolerating diet well.   Objective: Filed Vitals:   01/02/13 2214 01/02/13 2330 01/03/13 0413 01/03/13 0955  BP: 166/73 140/70 117/57   Pulse: 76  75   Temp: 97.9 F (36.6 C)  97.8 F (36.6 C)   TempSrc: Oral  Oral   Resp: 22  18   Height: 5\' 8"  (1.727 m)     Weight: 71.169 kg (156 lb 14.4 oz)  72.031 kg (158 lb 12.8 oz)   SpO2: 98%  97% 98%    Intake/Output Summary (Last 24 hours) at 01/03/13 1007 Last data filed at 01/03/13 0810  Gross per 24 hour  Intake      0 ml  Output    700 ml  Net   -700 ml   Filed Weights   01/02/13 2037 01/02/13 2214 01/03/13 0413  Weight: 73.483 kg (162 lb) 71.169 kg (156 lb 14.4 oz) 72.031 kg (158 lb 12.8  oz)    Exam:   General:  WDWN male, appears to be resting fairly comfortably, no acute distress  HEENT: normocephalic, atraumatic, sclera/conjunctiva clear, PERLA, moist mucous membranes, neck supple, no JVD  Cardiovascular: RRR, S1,S2 appreciated, no rubs/gallops, systolic ejection murmur 2/6 heard best at right 2nd intercostal space  Respiratory: good air entry bilaterally, bibasilar crackles, otherwise clear to auscultation   Abdomen: normal bowel sounds, no masses, soft, non distended, non tender to  palpation   Musculoskeletal: moves all extremities spontaneously, mild edema bilaterally in lower extremities without erythema, no cyanosis, no clubbing   Neurological: alert and oriented x3, cranial nerves 2-12 grossly intact, no focal deficits, normal affect  Data Reviewed: Basic Metabolic Panel:  Recent Labs Lab 01/02/13 1635 01/03/13 0640  NA 129* 130*  K 4.9 4.5  CL 92* 95*  CO2 30 29  GLUCOSE 115* 110*  BUN 21 15  CREATININE 1.15 0.90  CALCIUM 10.2 9.1   CBC:  Recent Labs Lab 01/02/13 1635 01/03/13 0640  WBC 7.2 7.2  HGB 10.8* 8.5*  HCT 30.1* 23.6*  MCV 79.8 79.5  PLT 279 228   Cardiac Enzymes:  Recent Labs Lab 01/02/13 2220  TROPONINI <0.30   BNP (last 3 results)  Recent Labs  01/02/13 2220  PROBNP 981.4*   CBG:  Recent Labs Lab 01/02/13 2231 01/03/13 0731  GLUCAP 150* 103*    Recent Results (from the past 240 hour(s))  MRSA PCR SCREENING     Status: None   Collection Time    01/02/13 10:22 PM      Result Value Range Status   MRSA by PCR NEGATIVE  NEGATIVE Final   Comment:            The GeneXpert MRSA Assay (FDA     approved for NASAL specimens     only), is one component of a     comprehensive MRSA colonization     surveillance program. It is not     intended to diagnose MRSA     infection nor to guide or     monitor treatment for     MRSA infections.     Studies: Dg Chest 2 View  01/02/2013  *RADIOLOGY REPORT*  Clinical Data: Congestion, cough and fever.  CHEST - 2 VIEW  Comparison: 06/15/2012 and prior chest radiographs dating back to 11/02/2007  Findings: The cardiomediastinal silhouette is unremarkable. Left lower lobe airspace disease is compatible with pneumonia. Mild peribronchial thickening is unchanged. There is no evidence of pleural effusion, pneumothorax, pulmonary mass or pulmonary edema.  IMPRESSION: Left lower lobe airspace disease/pneumonia.  Radiographic follow up to resolution is recommended.   Original Report  Authenticated By: Harmon Pier, M.D.     Scheduled Meds: . ipratropium  0.5 mg Nebulization Q4H   And  . albuterol  2.5 mg Nebulization Q4H  . antiseptic oral rinse  15 mL Mouth Rinse BID  . aspirin EC  81 mg Oral Daily  . atorvastatin  80 mg Oral QHS  . ceFEPime (MAXIPIME) IV  1 g Intravenous Q24H  . docusate sodium  100 mg Oral BID  . enoxaparin (LOVENOX) injection  40 mg Subcutaneous Q24H  . ferrous sulfate  650 mg Oral QPM  . fluticasone  2 puff Inhalation BID  . gabapentin  300 mg Oral QHS  . insulin aspart  0-9 Units Subcutaneous TID WC  . isosorbide dinitrate  30 mg Oral TID  . levofloxacin (LEVAQUIN) IV  750 mg Intravenous Q48H  .  loratadine  10 mg Oral Daily  . losartan  25 mg Oral QPM  . methylPREDNISolone (SOLU-MEDROL) injection  80 mg Intravenous Once  . metoprolol  25 mg Oral BID  . omeprazole  80 mg Oral BID AC  . [START ON 01/04/2013] predniSONE  50 mg Oral Q breakfast  . ranolazine  500 mg Oral BID  . sodium chloride  3 mL Intravenous Q12H  . sodium chloride  3 mL Intravenous Q12H  . terazosin  5 mg Oral BID  . Ticagrelor  90 mg Oral BID  . Travoprost (BAK Free)  1 drop Both Eyes QHS  . vancomycin  750 mg Intravenous Q12H   Continuous Infusions:   Principal Problem:   Healthcare-associated pneumonia Active Problems:   CAD (coronary artery disease)   COPD (chronic obstructive pulmonary disease)   Hyponatremia   Anemia   Diabetes mellitus   Hyperlipidemia   Algis Downs, PA-C Rudolpho Sevin PA-S [facility] If 7PM-7AM, please contact night-coverage at [email or web address], password [facility] 01/03/2013, 10:07 AM  LOS: 1 day   Attending  Patient seen and examined, agree with the assessment and plan as outlined above. Patient admitted with SOB/Cough/Weakness-found to have PNA. Dopplers today show B/L DVT. Had a PTCA a few weeks back-on ASA/Brilinta-need to d/w his primary cards-whether he still needs dual anti-platelet therapy as patient will need  anticoagulation with Xarelto. Suspect he may have Pul Embolism as well-getting VQ Scan-patient is allergic to IV Dye.  S Ashiah Karpowicz

## 2013-01-03 NOTE — Progress Notes (Signed)
INITIAL NUTRITION ASSESSMENT  DOCUMENTATION CODES Per approved criteria  -Not Applicable   INTERVENTION: 1. Glucerna Shake po daily, each supplement provides 220 kcal and 10 grams of protein.   NUTRITION DIAGNOSIS: Inadequate oral intake related to Decreased appetite with age as evidenced by weight loss.   Goal: Intake to meet >/=90% estimated nutrition needs.   Monitor:  PO intake, weight trends  Reason for Assessment: Malnutrition Screening Tool  79 y.o. male  Admitting Dx: Healthcare-associated pneumonia  ASSESSMENT:  Pt admitted with PNA.  Pt states that he has had poor appetite and slowly declining weight for over 1 year, thinks he has lost about 10 lbs in the past year. Continues to eat several meals per day, but portion sizes are smaller. Drinks an Ensure on occasion. Encouraged pt to drink one Ensure per day to help reduce additional weight loss.   Height: Ht Readings from Last 1 Encounters:  01/02/13 5\' 8"  (1.727 m)    Weight: Wt Readings from Last 1 Encounters:  01/03/13 158 lb 12.8 oz (72.031 kg)    Ideal Body Weight: 154 lbs   % Ideal Body Weight: 102%  Wt Readings from Last 10 Encounters:  01/03/13 158 lb 12.8 oz (72.031 kg)  07/31/11 178 lb 5.6 oz (80.9 kg)    Usual Body Weight: 178 lbs, about 1 year ago per pt  % Usual Body Weight: 89%  BMI:  Body mass index is 24.15 kg/(m^2). WNL   Estimated Nutritional Needs: Kcal: 1600-1800 Protein: 70-80 gm  Fluid: >/= 1.8 L   Skin: intact    Diet Order: Carb Control  EDUCATION NEEDS: -No education needs identified at this time   Intake/Output Summary (Last 24 hours) at 01/03/13 1135 Last data filed at 01/03/13 1045  Gross per 24 hour  Intake    243 ml  Output    700 ml  Net   -457 ml    Last BM: PTA    Labs:   Recent Labs Lab 01/02/13 1635 01/03/13 0640  NA 129* 130*  K 4.9 4.5  CL 92* 95*  CO2 30 29  BUN 21 15  CREATININE 1.15 0.90  CALCIUM 10.2 9.1  GLUCOSE 115* 110*    Lab Results  Component Value Date   HGBA1C 6.9* 07/29/2011    CBG (last 3)   Recent Labs  01/02/13 2231 01/03/13 0731  GLUCAP 150* 103*    Scheduled Meds: . ipratropium  0.5 mg Nebulization Q4H   And  . albuterol  2.5 mg Nebulization Q4H  . antiseptic oral rinse  15 mL Mouth Rinse BID  . aspirin EC  81 mg Oral Daily  . atorvastatin  80 mg Oral QHS  . ceFEPime (MAXIPIME) IV  1 g Intravenous Q8H  . docusate sodium  100 mg Oral BID  . enoxaparin (LOVENOX) injection  40 mg Subcutaneous Q24H  . ferrous sulfate  650 mg Oral QPM  . fluticasone  2 puff Inhalation BID  . gabapentin  300 mg Oral QHS  . insulin aspart  0-9 Units Subcutaneous TID WC  . isosorbide dinitrate  30 mg Oral TID  . [START ON 01/04/2013] levofloxacin (LEVAQUIN) IV  750 mg Intravenous Q24H  . loratadine  10 mg Oral Daily  . losartan  25 mg Oral QPM  . metoprolol  25 mg Oral BID  . omeprazole  80 mg Oral BID AC  . [START ON 01/04/2013] predniSONE  50 mg Oral Q breakfast  . ranolazine  500 mg Oral BID  .  sodium chloride  3 mL Intravenous Q12H  . sodium chloride  3 mL Intravenous Q12H  . terazosin  5 mg Oral BID  . Ticagrelor  90 mg Oral BID  . Travoprost (BAK Free)  1 drop Both Eyes QHS  . vancomycin  750 mg Intravenous Q12H    Continuous Infusions:   Past Medical History  Diagnosis Date  . Aortic stenosis   . High cholesterol   . COPD (chronic obstructive pulmonary disease)   . Retinal artery occlusion   . Emphysema   . GERD (gastroesophageal reflux disease)   . Coronary artery disease   . Heart murmur   . On home oxygen therapy     "2L all the time" (01/02/2013)  . Angina   . Hypertension   . CHF (congestive heart failure)   . MI (myocardial infarction)     "I've had several; last one was 03/09/2012" (01/02/2013  . Asthma   . Pneumonia     "today; have had it at least twice before" (01/02/2013)  . Shortness of breath     "most of the time recently" (01/02/2013)  . Chronic bronchitis   . Type II  diabetes mellitus   . History of stomach ulcers     "have them now; they flare up when I eat the wrong thing or don't eat" (01/02/2013)  . Hepatitis     "don't know which one" (01/02/2013)  . Headache     "probably weekly" (01/02/2013)  . Stroke     "several; left ankle/foot still weak from one of them" (01/02/2013)  . Arthritis     "all over" (01/02/2013)  . DJD (degenerative joint disease)     "all over" (01/02/2013)  . Chronic lower back pain   . Urinary urgency   . Prostate cancer     "had radiation treatments" (01/02/2013)  . Basal cell carcinoma     "left nose, behind right knee" (01/02/2013)  . Anemia     chronic/notes 01/02/2013    Past Surgical History  Procedure Laterality Date  . Shoulder arthroscopy w/ rotator cuff repair Right 1990's; 2000's    "they've operated on it twice" (01/02/2013)  . Sp pta peripheral      Bilateral leg stents  . Inguinal hernia repair Bilateral     "1 wk apart" (01/02/2013)  . Coronary stent placement      "I've had 7 or 8 stents put in my heart" (01/02/2013)  . Cardiac catheterization      "couldn't get new type of stents put in" (01/02/2013)  . Coronary angioplasty with stent placement      "I've had 7 or 8 stents put in my heart; last stents put in ~ 2 wk ago" (01/02/2013)  . Back surgery    . Anterior fusion cervical spine      "C5-7; took bone off my right hip" (5-01/2013)    Clarene Duke RD, LDN Pager [phone] After Hours pager [phone]

## 2013-01-03 NOTE — Progress Notes (Signed)
Pharmacy: Vancomycin, Cefepime, Levaquin  85yom started on antibiotics yesterday for pneumonia. Renal function has improved since antibiotic initiation. sCr down to 0.9 and CrCl 62ml/min. Will adjust antibiotics accordingly.  Plan: 1) Continue vancomycin 750mg  IV q12 2) Change cefepime to 1g IV q8 3) Change levaquin to 750mg  IV q24  Louie Casa, PharmD, BCPS 01/03/13, 10:46 AM

## 2013-01-04 DIAGNOSIS — D649 Anemia, unspecified: Secondary | ICD-10-CM

## 2013-01-04 LAB — CBC
MCH: 29.5 pg (ref 26.0–34.0)
MCHC: 37 g/dL — ABNORMAL HIGH (ref 30.0–36.0)
MCV: 79.7 fL (ref 78.0–100.0)
Platelets: 226 10*3/uL (ref 150–400)

## 2013-01-04 LAB — BASIC METABOLIC PANEL
Calcium: 9.3 mg/dL (ref 8.4–10.5)
Creatinine, Ser: 1 mg/dL (ref 0.50–1.35)
GFR calc non Af Amer: 67 mL/min — ABNORMAL LOW (ref >90)
Glucose, Bld: 195 mg/dL — ABNORMAL HIGH (ref 70–99)
Sodium: 129 mEq/L — ABNORMAL LOW (ref 135–145)

## 2013-01-04 LAB — GLUCOSE, CAPILLARY: Glucose-Capillary: 231 mg/dL — ABNORMAL HIGH (ref 70–99)

## 2013-01-04 MED ORDER — POLYVINYL ALCOHOL 1.4 % OP SOLN
1.0000 [drp] | OPHTHALMIC | Status: DC | PRN
  Administered 2013-01-04: 1 [drp] via OPHTHALMIC
  Filled 2013-01-04: qty 15

## 2013-01-04 MED ORDER — PREDNISONE 20 MG PO TABS
40.0000 mg | ORAL_TABLET | Freq: Every day | ORAL | Status: DC
  Administered 2013-01-05: 40 mg via ORAL
  Filled 2013-01-04 (×2): qty 2

## 2013-01-04 MED ORDER — INSULIN GLARGINE 100 UNIT/ML ~~LOC~~ SOLN
8.0000 [IU] | Freq: Every day | SUBCUTANEOUS | Status: DC
  Administered 2013-01-04: 8 [IU] via SUBCUTANEOUS
  Filled 2013-01-04 (×2): qty 0.08

## 2013-01-04 MED ORDER — RANOLAZINE ER 500 MG PO TB12
500.0000 mg | ORAL_TABLET | Freq: Two times a day (BID) | ORAL | Status: DC
  Administered 2013-01-04 – 2013-01-05 (×3): 500 mg via ORAL
  Filled 2013-01-04 (×4): qty 1

## 2013-01-04 MED ORDER — CLOPIDOGREL BISULFATE 75 MG PO TABS
75.0000 mg | ORAL_TABLET | Freq: Every day | ORAL | Status: DC
  Administered 2013-01-05: 75 mg via ORAL
  Filled 2013-01-04 (×2): qty 1

## 2013-01-04 MED ORDER — SODIUM POLYSTYRENE SULFONATE 15 GM/60ML PO SUSP
30.0000 g | Freq: Once | ORAL | Status: AC
  Administered 2013-01-04: 30 g via ORAL
  Filled 2013-01-04: qty 120

## 2013-01-04 MED ORDER — FUROSEMIDE 20 MG PO TABS
20.0000 mg | ORAL_TABLET | Freq: Every day | ORAL | Status: DC
  Administered 2013-01-04 – 2013-01-05 (×2): 20 mg via ORAL
  Filled 2013-01-04 (×3): qty 1

## 2013-01-04 MED ORDER — RIVAROXABAN 20 MG PO TABS: 20.0000 mg | ORAL_TABLET | Freq: Every day | ORAL | Status: DC

## 2013-01-04 MED ORDER — INSULIN ASPART 100 UNIT/ML ~~LOC~~ SOLN
0.0000 [IU] | Freq: Three times a day (TID) | SUBCUTANEOUS | Status: DC
  Administered 2013-01-04: 3 [IU] via SUBCUTANEOUS
  Administered 2013-01-04: 8 [IU] via SUBCUTANEOUS
  Administered 2013-01-05: 3 [IU] via SUBCUTANEOUS
  Administered 2013-01-05: 8 [IU] via SUBCUTANEOUS

## 2013-01-04 NOTE — Progress Notes (Signed)
Utilization review completed. Elisea Khader, RN, BSN. 

## 2013-01-04 NOTE — Clinical Social Work Psychosocial (Signed)
Clinical Social Work Department BRIEF PSYCHOSOCIAL ASSESSMENT 01/04/2013  Patient:  Eugene Bauer, VICARIO     Account Number:  [id number]     Admit date:  01/02/2013  Clinical Social Worker:  Skip Mayer  Date/Time:  01/04/2013 03:00 PM  Referred by:  Physician  Date Referred:  01/04/2013 Referred for  SNF Placement   Other Referral:   Interview type:  Patient Other interview type:    PSYCHOSOCIAL DATA Living Status:  FACILITY Admitted from facility:  OTHER Level of care:   Primary support name:  pam/dtr/poa [phone] Primary support relationship to patient:  CHILD, ADULT Degree of support available:   strong support    CURRENT CONCERNS Current Concerns  Post-Acute Placement   Other Concerns:    SOCIAL WORK ASSESSMENT / PLAN CSW met pt and pt's dtr re: snf.  Pt admitted from independent living at [facility].  Pt's dtr reports previous stay [facility] years ago.  CSW explained placement process and answered questions.  Will f/u with offers   Assessment/plan status:  Information/Referral to [facility] Other assessment/ plan:   Information/referral to community resources:   SNF  PTAR    PATIENT'S/FAMILY'S RESPONSE TO PLAN OF CARE: Pt reports agreeable to STR in SNF in order to increase strength and independence with mobility prior to return to independent living.  Pt and pt's dtr verbalized appreciation for CSW assist and understanding of placement process.

## 2013-01-04 NOTE — Progress Notes (Signed)
PATIENT DETAILS Name: Eugene Bauer Age: 79 y.o. Sex: male Date of Birth: 06-Jan-1927 Admit Date: 01/02/2013 Admitting Physician Eduard Clos, MD ZOX:[id number], [place]  Subjective: Feels a lot better  Assessment/Plan: Principal Problem:   Healthcare-associated pneumonia -better today-afebrile -stop Cefepime, but continue with Vancomycin and Levaquin  Active Problems: DVT -on Xarelto -spoke with Cards at [facility] [place]-Dr Darl Pikes [facility] and Dr Lucile Crater advised that we change from Brilinta to Plavix. To continue with ASA 81 mg  Hyponatremia -get serum and urine osmolality -begin fluid restriction and low dose lasix -suspect SIADH  CAD/CHF -s/p stenting 2 weeks ago at [facility]  -spoke with Cards at [facility] [place]-Dr Darl Pikes [facility]'s and Dr Lucile Crater advised that we change from Brilinta to Plavix. -To continue with ASA 81 mg  Anemia -Worsening Hb-microcytic-but no overt bleeding -on PPI -Last Colonoscopy per daugher in ?2007-she will get records to [place] -check stool FOBT  GERD -Continue omeprazole, zofran   Diabetes Mellitus Type II -CBG's uncontrolled -Begin low dose lantus -c/w SSI-but change to moderate scale   COPD with acute exacerbation -with Wheeze  -Continue nebs, IS, steroids-change to prednisone  Hyperlipidemia  -Stable  -Continue lipitor  Hx of Prostate Cancer  -Stable  -Continue terazosin   Disposition: Remain inpatient  DVT Prophylaxis: On Xarelto  Code Status: Full code   Family Communication Daughter at bedside  Procedures:  None  CONSULTS:  None   MEDICATIONS: Scheduled Meds: . ipratropium  0.5 mg Nebulization QID   And  . albuterol  2.5 mg Nebulization QID  . antiseptic oral rinse  15 mL Mouth Rinse BID  . aspirin EC  81 mg Oral Daily  . atorvastatin  80 mg Oral QHS  . ceFEPime (MAXIPIME) IV  1 g Intravenous Q8H  . [START ON 01/05/2013] clopidogrel  75 mg Oral Q breakfast  . docusate sodium  100 mg Oral BID  .  feeding supplement  237 mL Oral Q24H  . ferrous sulfate  650 mg Oral QPM  . fluticasone  2 puff Inhalation BID  . furosemide  20 mg Oral Daily  . gabapentin  300 mg Oral QHS  . insulin aspart  0-9 Units Subcutaneous TID WC  . isosorbide dinitrate  30 mg Oral TID  . levofloxacin (LEVAQUIN) IV  750 mg Intravenous Q24H  . loratadine  10 mg Oral Daily  . metoprolol  25 mg Oral BID  . omeprazole  80 mg Oral BID AC  . [START ON 01/05/2013] predniSONE  40 mg Oral Q breakfast  . ranolazine  500 mg Oral BID  . [START ON 01/25/2013] rivaroxaban  20 mg Oral Q supper  . rivaroxaban  15 mg Oral BID  . sodium chloride  3 mL Intravenous Q12H  . sodium chloride  3 mL Intravenous Q12H  . sodium polystyrene  30 g Oral Once  . terazosin  5 mg Oral BID  . Travoprost (BAK Free)  1 drop Both Eyes QHS  . vancomycin  750 mg Intravenous Q12H   Continuous Infusions:  PRN Meds:.acetaminophen, acetaminophen, albuterol, mupirocin ointment, nitroGLYCERIN, ondansetron (ZOFRAN) IV, ondansetron  Antibiotics: Anti-infectives   Start     Dose/Rate Route Frequency Ordered Stop   01/04/13 0130  levofloxacin (LEVAQUIN) IVPB 750 mg     750 mg 100 mL/hr over 90 Minutes Intravenous Every 24 hours 01/03/13 1048     01/03/13 1100  ceFEPIme (MAXIPIME) 1 g in dextrose 5 % 50 mL IVPB     1 g 100 mL/hr  over 30 Minutes Intravenous 3 times per day 01/03/13 1048     01/02/13 2230  ceFEPIme (MAXIPIME) 1 g in dextrose 5 % 50 mL IVPB  Status:  Discontinued     1 g 100 mL/hr over 30 Minutes Intravenous Every 24 hours 01/02/13 2210 01/03/13 1048   01/02/13 2230  levofloxacin (LEVAQUIN) IVPB 750 mg  Status:  Discontinued     750 mg 100 mL/hr over 90 Minutes Intravenous Every 48 hours 01/02/13 2210 01/03/13 1048   01/02/13 2100  vancomycin (VANCOCIN) IVPB 750 mg/150 ml premix     750 mg 150 mL/hr over 60 Minutes Intravenous Every 12 hours 01/02/13 2046     01/02/13 1915  cefTRIAXone (ROCEPHIN) 1 g in dextrose 5 % 50 mL IVPB     1  g 100 mL/hr over 30 Minutes Intravenous  Once 01/02/13 1914 01/02/13 2038   01/02/13 1915  azithromycin (ZITHROMAX) 500 mg in dextrose 5 % 250 mL IVPB     500 mg 250 mL/hr over 60 Minutes Intravenous  Once 01/02/13 1914 01/02/13 2200       PHYSICAL EXAM: Vital signs in last 24 hours: Filed Vitals:   01/04/13 0445 01/04/13 0523 01/04/13 0742 01/04/13 1023  BP:  112/54  153/66  Pulse:  75  87  Temp:  97.8 F (36.6 C)  97.5 F (36.4 C)  TempSrc:  Oral  Oral  Resp:  18  18  Height:      Weight: 72.621 kg (160 lb 1.6 oz)     SpO2:  96% 96% 96%    Weight change: -0.862 kg (-1 lb 14.4 oz) Filed Weights   01/02/13 2214 01/03/13 0413 01/04/13 0445  Weight: 71.169 kg (156 lb 14.4 oz) 72.031 kg (158 lb 12.8 oz) 72.621 kg (160 lb 1.6 oz)   Body mass index is 24.35 kg/(m^2).   Gen Exam: Awake and alert with clear speech.   Neck: Supple, No JVD.   Chest: B/L Clear.   CVS: S1 S2 Regular, no murmurs.  Abdomen: soft, BS +, non tender, non distended.  Extremities: 1+ edema, lower extremities warm to touch. Neurologic: Non Focal.   Skin: No Rash.   Wounds: N/A.    Intake/Output from previous day:  Intake/Output Summary (Last 24 hours) at 01/04/13 1207 Last data filed at 01/04/13 1059  Gross per 24 hour  Intake    480 ml  Output    800 ml  Net   -320 ml     LAB RESULTS: CBC  Recent Labs Lab 01/02/13 1635 01/03/13 0640 01/04/13 0425  WBC 7.2 7.2 7.6  HGB 10.8* 8.5* 8.0*  HCT 30.1* 23.6* 21.6*  PLT 279 228 226  MCV 79.8 79.5 79.7  MCH 28.6 28.6 29.5  MCHC 35.9 36.0 37.0*  RDW 14.1 14.0 14.1    Chemistries   Recent Labs Lab 01/02/13 1635 01/03/13 0640 01/04/13 0425  NA 129* 130* 129*  K 4.9 4.5 5.2*  CL 92* 95* 96  CO2 30 29 25   GLUCOSE 115* 110* 195*  BUN 21 15 18   CREATININE 1.15 0.90 1.00  CALCIUM 10.2 9.1 9.3    CBG:  Recent Labs Lab 01/03/13 1510 01/03/13 1721 01/03/13 2150 01/04/13 0751 01/04/13 1151  GLUCAP 148* 338* 275* 129* 188*     GFR Estimated Creatinine Clearance: 54.2 ml/min (by C-G formula based on Cr of 1).  Coagulation profile No results found for this basename: INR, PROTIME,  in the last 168 hours  Cardiac Enzymes  Recent Labs Lab 01/02/13 2220  TROPONINI <0.30    No components found with this basename: POCBNP,  No results found for this basename: DDIMER,  in the last 72 hours No results found for this basename: HGBA1C,  in the last 72 hours No results found for this basename: CHOL, HDL, LDLCALC, TRIG, CHOLHDL, LDLDIRECT,  in the last 72 hours  Recent Labs  01/02/13 2220  TSH 0.853   No results found for this basename: VITAMINB12, FOLATE, FERRITIN, TIBC, IRON, RETICCTPCT,  in the last 72 hours No results found for this basename: LIPASE, AMYLASE,  in the last 72 hours  Urine Studies No results found for this basename: UACOL, UAPR, USPG, UPH, UTP, UGL, UKET, UBIL, UHGB, UNIT, UROB, ULEU, UEPI, UWBC, URBC, UBAC, CAST, CRYS, UCOM, BILUA,  in the last 72 hours  MICROBIOLOGY: Recent Results (from the past 240 hour(s))  MRSA PCR SCREENING     Status: None   Collection Time    01/02/13 10:22 PM      Result Value Range Status   MRSA by PCR NEGATIVE  NEGATIVE Final   Comment:            The GeneXpert MRSA Assay (FDA     approved for NASAL specimens     only), is one component of a     comprehensive MRSA colonization     surveillance program. It is not     intended to diagnose MRSA     infection nor to guide or     monitor treatment for     MRSA infections.    RADIOLOGY STUDIES/RESULTS: Dg Chest 2 View  01/02/2013  *RADIOLOGY REPORT*  Clinical Data: Congestion, cough and fever.  CHEST - 2 VIEW  Comparison: 06/15/2012 and prior chest radiographs dating back to 11/02/2007  Findings: The cardiomediastinal silhouette is unremarkable. Left lower lobe airspace disease is compatible with pneumonia. Mild peribronchial thickening is unchanged. There is no evidence of pleural effusion, pneumothorax,  pulmonary mass or pulmonary edema.  IMPRESSION: Left lower lobe airspace disease/pneumonia.  Radiographic follow up to resolution is recommended.   Original Report Authenticated By: Harmon Pier, M.D.    Nm Pulmonary Perf And Vent  01/03/2013  *RADIOLOGY REPORT*  Clinical Data:  Bilateral lower extremity DVT, evaluate for pulmonary embolism  NUCLEAR MEDICINE VENTILATION - PERFUSION LUNG SCAN  Technique:  Wash-in, equilibrium, and wash-out phase ventilation images were obtained using Xe-133 gas.  Perfusion images were obtained in multiple projections after intravenous injection of Tc- 55m MAA.  Radiopharmaceuticals:  40 mCi technetium 55m DTPA inhalant and 6 mCi Tc-5m MAA.  Comparison:  Chest radiograph - 01/02/2013; a VQ scan - 09/23/2010  Findings:  Review of preceding chest radiograph demonstrates borderline enlarged cardiac silhouette and mediastinal contours.  The lungs appear hyperexpanded with flattening of bilateral hemidiaphragms. Ill-defined left basilar heterogeneous opacities.  No definite pleural effusion or pneumothorax.  Ventilation images:  There is clumping of inhaled of radiotracer primarily about the bilateral pulmonary hila.  Otherwise, there is a faint, mottled ventilation of the bilateral lungs.  Perfusion images:  There is mild heterogeneous perfusion of the bilateral lungs, similar to remote VQ scan performed 09/23/2010. No discrete areas of geographic mismatched segmental non-perfusion to suggest pulmonary embolism.  IMPRESSION: 1.  Pulmonary embolism absent (low probability for pulmonary embolism).  2.  Clumping of inhaled radiotracer about the bilateral pulmonary hila, nonspecific but favor airways disease.   Original Report Authenticated By: Tacey Ruiz, MD     Jeoffrey Massed, MD  [facility] Pager:[phone]  If 7PM-7AM, please contact night-coverage [email or web address] Password [email or web address] 01/04/2013, 12:07 PM   LOS: 2 days

## 2013-01-05 LAB — OSMOLALITY: Osmolality: 284 mOsm/kg (ref 275–300)

## 2013-01-05 LAB — BASIC METABOLIC PANEL
BUN: 13 mg/dL (ref 6–23)
Calcium: 9.8 mg/dL (ref 8.4–10.5)
Creatinine, Ser: 0.99 mg/dL (ref 0.50–1.35)
GFR calc non Af Amer: 74 mL/min — ABNORMAL LOW (ref >90)
Glucose, Bld: 125 mg/dL — ABNORMAL HIGH (ref 70–99)
Sodium: 135 mEq/L (ref 135–145)

## 2013-01-05 LAB — GLUCOSE, CAPILLARY
Glucose-Capillary: 100 mg/dL — ABNORMAL HIGH (ref 70–99)
Glucose-Capillary: 167 mg/dL — ABNORMAL HIGH (ref 70–99)

## 2013-01-05 MED ORDER — RIVAROXABAN 20 MG PO TABS: 20.0000 mg | ORAL_TABLET | Freq: Every day | ORAL | Status: DC

## 2013-01-05 MED ORDER — RIVAROXABAN 15 MG PO TABS: 15.0000 mg | ORAL_TABLET | Freq: Two times a day (BID) | ORAL | Status: DC

## 2013-01-05 MED ORDER — POLYVINYL ALCOHOL 1.4 % OP SOLN: 1.0000 [drp] | OPHTHALMIC | Status: AC | PRN

## 2013-01-05 MED ORDER — PREDNISONE 10 MG PO TABS: ORAL_TABLET | ORAL | Status: DC

## 2013-01-05 MED ORDER — ALBUTEROL SULFATE (5 MG/ML) 0.5% IN NEBU: 2.5000 mg | INHALATION_SOLUTION | Freq: Three times a day (TID) | RESPIRATORY_TRACT | Status: DC

## 2013-01-05 MED ORDER — LEVOFLOXACIN 750 MG PO TABS: 750.0000 mg | ORAL_TABLET | ORAL | Status: DC

## 2013-01-05 MED ORDER — INSULIN ASPART 100 UNIT/ML ~~LOC~~ SOLN: SUBCUTANEOUS | Status: DC

## 2013-01-05 MED ORDER — LEVOFLOXACIN 750 MG PO TABS
750.0000 mg | ORAL_TABLET | ORAL | Status: DC
  Filled 2013-01-05: qty 1

## 2013-01-05 MED ORDER — CLOPIDOGREL BISULFATE 75 MG PO TABS: 75.0000 mg | ORAL_TABLET | Freq: Every day | ORAL | Status: DC

## 2013-01-05 MED ORDER — FUROSEMIDE 20 MG PO TABS: 20.0000 mg | ORAL_TABLET | Freq: Every day | ORAL | Status: DC

## 2013-01-05 MED ORDER — OMEPRAZOLE 40 MG PO CPDR: 40.0000 mg | DELAYED_RELEASE_CAPSULE | Freq: Two times a day (BID) | ORAL | Status: AC

## 2013-01-05 MED ORDER — VANCOMYCIN HCL IN DEXTROSE 1-5 GM/200ML-% IV SOLN
1000.0000 mg | Freq: Two times a day (BID) | INTRAVENOUS | Status: DC
  Administered 2013-01-05: 1000 mg via INTRAVENOUS
  Filled 2013-01-05 (×2): qty 200

## 2013-01-05 MED ORDER — IPRATROPIUM BROMIDE 0.02 % IN SOLN: 0.5000 mg | Freq: Three times a day (TID) | RESPIRATORY_TRACT | Status: DC

## 2013-01-05 MED ORDER — GLUCERNA SHAKE PO LIQD: 237.0000 mL | ORAL | Status: DC

## 2013-01-05 NOTE — Progress Notes (Signed)
ANTIBIOTIC CONSULT NOTE - FOLLOW UP  Pharmacy Consult for Vancomycin Indication: pneumonia  Allergies  Allergen Reactions  . Iodine Shortness Of Breath  . Iohexol      Code: SOB, Desc: ivp dye, iodine, Onset Date: [id number]     Patient Measurements: Height: 5\' 8"  (172.7 cm) Weight: 160 lb 1.6 oz (72.621 kg) IBW/kg (Calculated) : 68.4  Vital Signs: Temp: 98.2 F (36.8 C) (05/08 2100) Temp src: Oral (05/08 2100) BP: 110/45 mmHg (05/08 2205) Pulse Rate: 90 (05/08 2205) Intake/Output from previous day: 05/08 0701 - 05/09 0700 In: 1283 [P.O.:480; I.V.:3; IV Piggyback:800] Out: 950 [Urine:950] Intake/Output from this shift:    Labs:  Recent Labs  01/02/13 1635 01/03/13 0640 01/04/13 0425  WBC 7.2 7.2 7.6  HGB 10.8* 8.5* 8.0*  PLT 279 228 226  CREATININE 1.15 0.90 1.00   Estimated Creatinine Clearance: 54.2 ml/min (by C-G formula based on Cr of 1).  Recent Labs  01/04/13 2049  VANCOTROUGH 11.7     Microbiology: Recent Results (from the past 720 hour(s))  MRSA PCR SCREENING     Status: None   Collection Time    01/02/13 10:22 PM      Result Value Range Status   MRSA by PCR NEGATIVE  NEGATIVE Final   Comment:            The GeneXpert MRSA Assay (FDA     approved for NASAL specimens     only), is one component of a     comprehensive MRSA colonization     surveillance program. It is not     intended to diagnose MRSA     infection nor to guide or     monitor treatment for     MRSA infections.    Anti-infectives   Start     Dose/Rate Route Frequency Ordered Stop   01/04/13 0130  levofloxacin (LEVAQUIN) IVPB 750 mg     750 mg 100 mL/hr over 90 Minutes Intravenous Every 24 hours 01/03/13 1048     01/03/13 1100  ceFEPIme (MAXIPIME) 1 g in dextrose 5 % 50 mL IVPB  Status:  Discontinued     1 g 100 mL/hr over 30 Minutes Intravenous 3 times per day 01/03/13 1048 01/04/13 1208   01/02/13 2230  ceFEPIme (MAXIPIME) 1 g in dextrose 5 % 50 mL IVPB  Status:   Discontinued     1 g 100 mL/hr over 30 Minutes Intravenous Every 24 hours 01/02/13 2210 01/03/13 1048   01/02/13 2230  levofloxacin (LEVAQUIN) IVPB 750 mg  Status:  Discontinued     750 mg 100 mL/hr over 90 Minutes Intravenous Every 48 hours 01/02/13 2210 01/03/13 1048   01/02/13 2100  vancomycin (VANCOCIN) IVPB 750 mg/150 ml premix     750 mg 150 mL/hr over 60 Minutes Intravenous Every 12 hours 01/02/13 2046     01/02/13 1915  cefTRIAXone (ROCEPHIN) 1 g in dextrose 5 % 50 mL IVPB     1 g 100 mL/hr over 30 Minutes Intravenous  Once 01/02/13 1914 01/02/13 2038   01/02/13 1915  azithromycin (ZITHROMAX) 500 mg in dextrose 5 % 250 mL IVPB     500 mg 250 mL/hr over 60 Minutes Intravenous  Once 01/02/13 1914 01/02/13 2200      Assessment: 85 YOM on vancomycin for HCAP, Vancomycin trough subtherapeutic = 11.7, confirmed with RN, PM dose was given after level was drawn. Renal function overall stale, no cultures.  Goal of Therapy:  Vancomycin trough level  15-20 mcg/ml  Plan:  - Increase vancomycin to 1g IV Q 12hrs - F/u renal function, recheck vancomycin trough after 3-5 doses if continues.  Bayard Hugger, PharmD, BCPS  Clinical Pharmacist  Pager: [phone]   01/05/2013,12:14 AM

## 2013-01-05 NOTE — Progress Notes (Signed)
Physical Therapy Treatment Patient Details Name: Eugene Bauer MRN: [id number] DOB: [birth year] Today's Date: 01/05/2013 Time: 4098-1191 PT Time Calculation (min): 32 min  PT Assessment / Plan / Recommendation Comments on Treatment Session  Pt is improving in functional mobility and gait with RW. He still has fatigue with activity and will need continued PT at SNF before  he is able to return to his apt alone.      Follow Up Recommendations        Does the patient have the potential to tolerate intense rehabilitation     Barriers to Discharge        Equipment Recommendations       Recommendations for Other Services    Frequency     Plan Discharge plan remains appropriate;Frequency remains appropriate    Precautions / Restrictions     Pertinent Vitals/Pain C/o dome pain from broken toes on left foot    Mobility  Bed Mobility Details for Bed Mobility Assistance: Pt sitting up in chair Transfers Transfers: Sit to Stand;Stand to Sit;Stand Pivot Transfers Sit to Stand: With upper extremity assist;5: Supervision;From chair/3-in-1 Stand to Sit: With upper extremity assist;To chair/3-in-1;With armrests;5: Supervision Details for Transfer Assistance: improvement in sit to stand Ambulation/Gait Ambulation/Gait Assistance: 5: Supervision Ambulation Distance (Feet): 200 Feet (100x2) Assistive device: Rolling walker Ambulation/Gait Assistance Details: occasional cues for hip and trunk extension for better postue Gait Pattern: Step-through pattern Gait velocity: WFL General Gait Details: Pt is able to move weill with RW; pt fatiguee with activity Stairs: No Wheelchair Mobility Wheelchair Mobility: No    Exercises Other Exercises Other Exercises: repeated sit to stand x3 with emphasis on posture   PT Diagnosis:    PT Problem List:   PT Treatment Interventions:     PT Goals Acute Rehab PT Goals PT Goal Formulation: With patient Time For Goal Achievement: 01/17/13 Potential  to Achieve Goals: Fair Pt will go Supine/Side to Sit: with modified independence Pt will go Sit to Supine/Side: with modified independence Pt will go Sit to Stand: with modified independence PT Goal: Sit to Stand - Progress: Progressing toward goal Pt will go Stand to Sit: with modified independence PT Goal: Stand to Sit - Progress: Progressing toward goal Pt will Ambulate: 51 - 150 feet;with supervision;with rolling walker PT Goal: Ambulate - Progress: Progressing toward goal  Visit Information  Last PT Received On: 01/05/13 Assistance Needed: +1    Subjective Data  Subjective: My whiskers are getting long Patient Stated Goal: pt asking if he can get someone to shave him   Cognition  Cognition Arousal/Alertness: Awake/alert Behavior During Therapy: WFL for tasks assessed/performed Overall Cognitive Status: Within Functional Limits for tasks assessed    Balance  Balance Balance Assessed: Yes Static Sitting Balance Static Sitting - Balance Support: No upper extremity supported Static Sitting - Level of Assistance: 7: Independent Static Standing Balance Static Standing - Balance Support: Bilateral upper extremity supported Static Standing - Level of Assistance: 6: Modified independent (Device/Increase time)  End of Session PT - End of Session Equipment Utilized During Treatment: Oxygen Activity Tolerance: Patient limited by fatigue Patient left: in chair;with call bell/phone within reach Nurse Communication: Mobility status   GP   Bayard Hugger. Manson Passey, [place] [phone]   01/05/2013, 12:55 PM

## 2013-01-05 NOTE — Care Management Note (Signed)
    Page 1 of 1   01/05/2013     3:38:15 PM   CARE MANAGEMENT NOTE 01/05/2013  Patient:  Eugene Bauer, Eugene Bauer   Account Number:  [id number]  Date Initiated:  01/05/2013  Documentation initiated by:  Letha Cape  Subjective/Objective Assessment:   dx abd pain,  admit- from [place]     Action/Plan:   pt eval- rec snf   Anticipated DC Date:  01/05/2013   Anticipated DC Plan:  SKILLED NURSING FACILITY  In-house referral  Clinical Social Worker      DC Planning Services  CM consult      Choice offered to / List presented to:             Status of service:  Completed, signed off Medicare Important Message given?   (If response is "NO", the following Medicare IM given date fields will be blank) Date Medicare IM given:   Date Additional Medicare IM given:    Discharge Disposition:  SKILLED NURSING FACILITY  Per UR Regulation:  Reviewed for med. necessity/level of care/duration of stay  If discussed at Long Length of Stay Meetings, dates discussed:    Comments:  01/05/13 15:36 Letha Cape RN, BSN [phone] patient is from [facility], per physical therapy recs snf.  CSW referral, pt for dc to snf today.

## 2013-01-05 NOTE — Discharge Summary (Signed)
PATIENT DETAILS Name: Eugene Bauer Age: 79 y.o. Sex: male Date of Birth: 1926-09-21 MRN: [id number]. Admit Date: 01/02/2013 Admitting Physician: Eduard Clos, MD NWG:[id number] [place], [place]  Recommendations for Outpatient Follow-up:  1. Monitor Hb and renal function periodically 2. Needs quick outpatient follow up with Primary Cardiologist at [facility] 3. Repeat chest x-ray in 6-8 weeks to document resolution of the pneumonia.  PRIMARY DISCHARGE DIAGNOSIS:  Principal Problem:   Healthcare-associated pneumonia Active Problems:   CAD (coronary artery disease)   COPD (chronic obstructive pulmonary disease)   Hyponatremia   Anemia   Diabetes mellitus   Hyperlipidemia      PAST MEDICAL HISTORY: Past Medical History  Diagnosis Date  . Aortic stenosis   . High cholesterol   . COPD (chronic obstructive pulmonary disease)   . Retinal artery occlusion   . Emphysema   . GERD (gastroesophageal reflux disease)   . Coronary artery disease   . Heart murmur   . On home oxygen therapy     "2L all the time" (01/02/2013)  . Angina   . Hypertension   . CHF (congestive heart failure)   . MI (myocardial infarction)     "I've had several; last one was 03/09/2012" (01/02/2013  . Asthma   . Pneumonia     "today; have had it at least twice before" (01/02/2013)  . Shortness of breath     "most of the time recently" (01/02/2013)  . Chronic bronchitis   . Type II diabetes mellitus   . History of stomach ulcers     "have them now; they flare up when I eat the wrong thing or don't eat" (01/02/2013)  . Hepatitis     "don't know which one" (01/02/2013)  . Headache     "probably weekly" (01/02/2013)  . Stroke     "several; left ankle/foot still weak from one of them" (01/02/2013)  . Arthritis     "all over" (01/02/2013)  . DJD (degenerative joint disease)     "all over" (01/02/2013)  . Chronic lower back pain   . Urinary urgency   . Prostate cancer     "had radiation treatments" (01/02/2013)  .  Basal cell carcinoma     "left nose, behind right knee" (01/02/2013)  . Anemia     chronic/notes 01/02/2013    DISCHARGE MEDICATIONS:   Medication List    STOP taking these medications       Ticagrelor 90 MG Tabs tablet  Commonly known as:  BRILINTA      TAKE these medications       acetaminophen 500 MG tablet  Commonly known as:  TYLENOL  Take 500 mg by mouth every 6 (six) hours as needed for pain.     albuterol (5 MG/ML) 0.5% nebulizer solution  Commonly known as:  PROVENTIL  Take 0.5 mLs (2.5 mg total) by nebulization 3 (three) times daily.     aspirin 81 MG tablet  Take 81 mg by mouth daily.     atorvastatin 80 MG tablet  Commonly known as:  LIPITOR  Take 80 mg by mouth at bedtime.     cetirizine 10 MG tablet  Commonly known as:  ZYRTEC  Take 10 mg by mouth daily as needed for allergies.     clopidogrel 75 MG tablet  Commonly known as:  PLAVIX  Take 1 tablet (75 mg total) by mouth daily with breakfast.     feeding supplement Liqd  Take 237 mLs by mouth daily.  ferrous sulfate 325 (65 FE) MG tablet  Take 650 mg by mouth every evening.     furosemide 20 MG tablet  Commonly known as:  LASIX  Take 1 tablet (20 mg total) by mouth daily with breakfast.     gabapentin 300 MG capsule  Commonly known as:  NEURONTIN  Take 300 mg by mouth at bedtime.     insulin aspart 100 UNIT/ML injection  Commonly known as:  novoLOG  0-15 Units, Subcutaneous, 3 times daily with meals  CBG < 70: implement hypoglycemia protocol  CBG 70 - 120: 0 units  CBG 121 - 150: 2 units  CBG 151 - 200: 3 units  CBG 201 - 250: 5 units  CBG 251 - 300: 8 units  CBG 301 - 350: 11 units  CBG 351 - 400: 15 units  CBG > 400: call MD     ipratropium 0.02 % nebulizer solution  Commonly known as:  ATROVENT  Take 2.5 mLs (0.5 mg total) by nebulization 3 (three) times daily.     isosorbide dinitrate 30 MG tablet  Commonly known as:  ISORDIL  Take 30 mg by mouth 3 (three) times  daily.     levofloxacin 750 MG tablet  Commonly known as:  LEVAQUIN  Take 1 tablet (750 mg total) by mouth daily. For 4 more days from 5/9 and then stop  Start taking on:  01/06/2013     losartan 25 MG tablet  Commonly known as:  COZAAR  Take 25 mg by mouth every evening.     metFORMIN 500 MG tablet  Commonly known as:  GLUCOPHAGE  Take 500 mg by mouth 2 (two) times daily with a meal.     metoprolol 50 MG tablet  Commonly known as:  LOPRESSOR  Take 25 mg by mouth 2 (two) times daily.     mometasone 220 MCG/INH inhaler  Commonly known as:  ASMANEX  Inhale 2 puffs into the lungs at bedtime.     mupirocin ointment 2 %  Commonly known as:  BACTROBAN  Apply 1 application topically daily as needed (face).     nitroGLYCERIN 0.4 MG SL tablet  Commonly known as:  NITROSTAT  Place 0.4 mg under the tongue every 5 (five) minutes as needed for chest pain.     omeprazole 40 MG capsule  Commonly known as:  PRILOSEC  Take 1 capsule (40 mg total) by mouth 2 (two) times daily before a meal.     polyvinyl alcohol 1.4 % ophthalmic solution  Commonly known as:  LIQUIFILM TEARS  Place 1 drop into both eyes as needed.     predniSONE 10 MG tablet  Commonly known as:  DELTASONE  Take 4 tablets daily for 2 days, then,   Take 3 tablets daily for 2 days, then,  Take 2 tablets daily for 2 days, then,   Take 1 tablet for one day and then stop     ranolazine 500 MG 12 hr tablet  Commonly known as:  RANEXA  Take 500 mg by mouth 2 (two) times daily.     Rivaroxaban 15 MG Tabs tablet  Commonly known as:  XARELTO  Take 1 tablet (15 mg total) by mouth 2 (two) times daily. Stop on 01/25/13-and then change to 20 mg once a day     Rivaroxaban 20 MG Tabs  Commonly known as:  XARELTO  Take 1 tablet (20 mg total) by mouth daily with supper. First dose on Thu 01/25/13  Start taking  on:  01/25/2013     terazosin 5 MG capsule  Commonly known as:  HYTRIN  Take 5 mg by mouth 2 (two) times daily.      Travoprost (BAK Free) 0.004 % Soln ophthalmic solution  Commonly known as:  TRAVATAN  Place 1 drop into both eyes at bedtime.        ALLERGIES:   Allergies  Allergen Reactions  . Iodine Shortness Of Breath  . Iohexol      Code: SOB, Desc: ivp dye, iodine, Onset Date: [id number]     BRIEF HPI:  See H&P, Labs, Consult and Test reports for all details in brief, Eugene Bauer is a 79 y.o. male who has had recent cardiac stents placed 2 weeks ago at [place] presents with complaints of shortness of breath.over the last 2 weeks patient has been asking the staff to bring his food to his room because of exertional shortness of breath. Denies any chest pain. He has been having some productive cough. In the ER chest x-ray shows left lower lobe infiltrate. Patient has been admitted for pneumonia.  CONSULTATIONS:   None  PERTINENT RADIOLOGIC STUDIES: Dg Chest 2 View  01/02/2013  *RADIOLOGY REPORT*  Clinical Data: Congestion, cough and fever.  CHEST - 2 VIEW  Comparison: 06/15/2012 and prior chest radiographs dating back to 11/02/2007  Findings: The cardiomediastinal silhouette is unremarkable. Left lower lobe airspace disease is compatible with pneumonia. Mild peribronchial thickening is unchanged. There is no evidence of pleural effusion, pneumothorax, pulmonary mass or pulmonary edema.  IMPRESSION: Left lower lobe airspace disease/pneumonia.  Radiographic follow up to resolution is recommended.   Original Report Authenticated By: Harmon Pier, M.D.    Nm Pulmonary Perf And Vent  01/03/2013  *RADIOLOGY REPORT*  Clinical Data:  Bilateral lower extremity DVT, evaluate for pulmonary embolism  NUCLEAR MEDICINE VENTILATION - PERFUSION LUNG SCAN  Technique:  Wash-in, equilibrium, and wash-out phase ventilation images were obtained using Xe-133 gas.  Perfusion images were obtained in multiple projections after intravenous injection of Tc- 23m MAA.  Radiopharmaceuticals:  40 mCi technetium 36m DTPA inhalant and 6 mCi  Tc-7m MAA.  Comparison:  Chest radiograph - 01/02/2013; a VQ scan - 09/23/2010  Findings:  Review of preceding chest radiograph demonstrates borderline enlarged cardiac silhouette and mediastinal contours.  The lungs appear hyperexpanded with flattening of bilateral hemidiaphragms. Ill-defined left basilar heterogeneous opacities.  No definite pleural effusion or pneumothorax.  Ventilation images:  There is clumping of inhaled of radiotracer primarily about the bilateral pulmonary hila.  Otherwise, there is a faint, mottled ventilation of the bilateral lungs.  Perfusion images:  There is mild heterogeneous perfusion of the bilateral lungs, similar to remote VQ scan performed 09/23/2010. No discrete areas of geographic mismatched segmental non-perfusion to suggest pulmonary embolism.  IMPRESSION: 1.  Pulmonary embolism absent (low probability for pulmonary embolism).  2.  Clumping of inhaled radiotracer about the bilateral pulmonary hila, nonspecific but favor airways disease.   Original Report Authenticated By: Tacey Ruiz, MD      PERTINENT LAB RESULTS: CBC:  Recent Labs  01/03/13 0640 01/04/13 0425 01/05/13 0521  WBC 7.2 7.6  --   HGB 8.5* 8.0* 8.3*  HCT 23.6* 21.6* 23.8*  PLT 228 226  --    CMET CMP     Component Value Date/Time   NA 135 01/05/2013 0521   K 4.2 01/05/2013 0521   CL 98 01/05/2013 0521   CO2 32 01/05/2013 0521   GLUCOSE 125* 01/05/2013 2130  BUN 13 01/05/2013 0521   CREATININE 0.99 01/05/2013 0521   CALCIUM 9.8 01/05/2013 0521   PROT 6.0 09/29/2010 1830   ALBUMIN 2.7* 09/29/2010 1830   AST 18 09/29/2010 1830   ALT 33 09/29/2010 1830   ALKPHOS 57 09/29/2010 1830   BILITOT 0.4 09/29/2010 1830   GFRNONAA 74* 01/05/2013 0521   GFRAA 85* 01/05/2013 0521    GFR Estimated Creatinine Clearance: 54.7 ml/min (by C-G formula based on Cr of 0.99). No results found for this basename: LIPASE, AMYLASE,  in the last 72 hours  Recent Labs  01/02/13 2220  TROPONINI <0.30   No components  found with this basename: POCBNP,  No results found for this basename: DDIMER,  in the last 72 hours No results found for this basename: HGBA1C,  in the last 72 hours No results found for this basename: CHOL, HDL, LDLCALC, TRIG, CHOLHDL, LDLDIRECT,  in the last 72 hours  Recent Labs  01/02/13 2220  TSH 0.853   No results found for this basename: VITAMINB12, FOLATE, FERRITIN, TIBC, IRON, RETICCTPCT,  in the last 72 hours Coags: No results found for this basename: PT, INR,  in the last 72 hours Microbiology: Recent Results (from the past 240 hour(s))  MRSA PCR SCREENING     Status: None   Collection Time    01/02/13 10:22 PM      Result Value Range Status   MRSA by PCR NEGATIVE  NEGATIVE Final   Comment:            The GeneXpert MRSA Assay (FDA     approved for NASAL specimens     only), is one component of a     comprehensive MRSA colonization     surveillance program. It is not     intended to diagnose MRSA     infection nor to guide or     monitor treatment for     MRSA infections.     BRIEF HOSPITAL COURSE:   Principal Problem:   Healthcare-associated pneumonia - Patient was admitted and started on empiric vancomycin, cefepime and Levaquin. Slowly he responded, cefepime was then discontinued, vancomycin was also been discontinued, patient will now be transitioned to Levaquin on discharge. He is afebrile, and is symptomatically improved. His shortness of breath is significantly better. He would need a repeat chest x-ray in 6-8 weeks' time to document resolution of the consolidation. We will defer this to be done by his attending M.D. in the outpatient setting.  Active Problems: DVT - On admission, patient was noted to have bilateral lower extremity swelling, given his recent hospitalization for PCI-a bilateral lower extremity Doppler was done which was positive for bilateral DVT. Patient was then placed on Xarelto. Since he was on dual antiplatelet therapy with aspirin and  Brilinta, it was felt that he was at high risk of bleeding-we then spoke with his primary cardiologist-Dr. Ebony Cargo and Dr. Smith Robert at the [facility]  In [place], [place] who recommended we switch from Artesian to Plavix. Cardiology recommended we continue with aspirin at 81 mg daily. Patient's primary cardiologist will quickly follow him up in the outpatient setting and hopefully will minimize antiplatelet usage in the next few months, unfortunately he just recently had a drug-eluting stent placed and needs dual antiplatelet therapy for now.  COPD with acute exacerbation - On admission patient was noted to have wheezing and was short of breath. He does have a history of chronic bronchitis. He was placed on steroids and nebulized bronchodilators  with good improvement. On discharge he will be continued on nebulized bronchodilators and tapering prednisone.  Hyponatremia - Likely secondary to SIADH in a setting of pneumonia. - She was placed on fluid restriction, and started on low-dose Lasix. Sodium at the day of discharge has significantly improved to 135. - Suggest continuing fluid restriction of approximately 1200 daily. Continue with Lasix on discharge with close monitoring of his electrolytes.  CAD/CHF  -s/p stenting 2 weeks ago at [facility] hospital  -spoke with Cards at [facility]-Dr Darl Pikes Robert's and Dr Lucile Crater advised that we change from Brilinta to Plavix. -To continue with ASA 81 mg   GERD  -Continue omeprazole  Diabetes Mellitus Type II - Resume metformin on discharge - Place on sliding scale insulin on discharge as well.  Hyperlipidemia  -Stable  -Continue lipitor   Hx of Prostate Cancer  -Stable  -Continue terazosin   TODAY-DAY OF DISCHARGE:  Subjective:   Vivianne Spence today has no headache,no chest abdominal pain,no new weakness tingling or numbness, feels much better wants to go home today.   Objective:   Blood pressure 137/49, pulse 71, temperature 98 F (36.7 C),  temperature source Oral, resp. rate 20, height 5\' 8"  (1.727 m), weight 74.3 kg (163 lb 12.8 oz), SpO2 99.00%.  Intake/Output Summary (Last 24 hours) at 01/05/13 1019 Last data filed at 01/05/13 0100  Gross per 24 hour  Intake    443 ml  Output   1000 ml  Net   -557 ml   Filed Weights   01/03/13 0413 01/04/13 0445 01/05/13 0500  Weight: 72.031 kg (158 lb 12.8 oz) 72.621 kg (160 lb 1.6 oz) 74.3 kg (163 lb 12.8 oz)    Exam Awake Alert, Oriented *3, No new F.N deficits, Normal affect [place].AT,PERRAL Supple Neck,No JVD, No cervical lymphadenopathy appriciated.  Symmetrical Chest wall movement, Good air movement bilaterally, CTAB RRR,No Gallops,Rubs or new Murmurs, No Parasternal Heave +ve B.Sounds, Abd Soft, Non tender, No organomegaly appriciated, No rebound -guarding or rigidity. No Cyanosis, Clubbing or edema, No new Rash or bruise  DISCHARGE CONDITION: Stable  DISPOSITION: SNF   DISCHARGE INSTRUCTIONS:    Activity:  As tolerated with Full fall precautions use walker/cane & assistance as needed  Diet recommendation: Diabetic Diet Heart Healthy diet Fluid restriction 1.2 lit/day  CODE STATUS -DNR      Discharge Orders   Future Orders Complete By Expires     Call MD for:  difficulty breathing, headache or visual disturbances  As directed     Call MD for:  temperature >100.4  As directed     Diet - low sodium heart healthy  As directed     Increase activity slowly  As directed        Follow-up Information   Follow up with [facility], MD. Schedule an appointment as soon as possible for a visit in 2 weeks. (after discharge from SNF)       Schedule an appointment as soon as possible for a visit in 2 weeks to follow up. (after discharge from SNF)    Contact information:   Primary Cardiologist at [facility]      Total Time spent on discharge equals 45 minutes.  SignedJeoffrey Massed 01/05/2013 10:19 AM

## 2013-01-11 NOTE — Clinical Social Work Placement (Signed)
Clinical Social Work Department CLINICAL SOCIAL WORK PLACEMENT AND DISCHARGE NOTE  01/11/2013  Patient:  Eugene Bauer, Eugene Bauer  Account Number:  [id number] Admit date:  01/02/2013  Clinical Social Worker:  Genelle Bal, LCSW  Date/time:  01/11/2013 08:02 AM  Clinical Social Work is seeking post-discharge placement for this patient at the following level of care:   SKILLED NURSING   (*CSW will update this form in Epic as items are completed)   01/04/2013  Patient/family provided with [facility] Department of Clinical Social Work's list of facilities offering this level of care within the geographic area requested by the patient (or if unable, by the patient's family).  01/04/2013  Patient/family informed of their freedom to choose among providers that offer the needed level of care, that participate in Medicare, Medicaid or managed care program needed by the patient, have an available bed and are willing to accept the patient.    Patient/family informed of [facility]' ownership interest in [facility], as well as of the fact that they are under no obligation to receive care at this facility.  PASARR submitted to EDS on 10/01/10  PASARR number received from EDS on 10/01/10  FL2 transmitted to all facilities in geographic area requested by pt/family on  01/05/2013 FL2 transmitted to all facilities within larger geographic area on   Patient informed that his/her managed care company has contracts with or will negotiate with  certain facilities, including the following:     Patient/family informed of bed offers received:  01/05/2013 Patient chooses bed at [facility] Physician recommends and patient chooses bed at    Patient to be transferred to [facility] on  01/05/2013 Patient to be transferred to facility by ambulance   The following physician request were entered in Epic:   Additional Comments: 01/05/13 - Discharge information forwarded to  facility. Discharge packet compiled and accompanied patient to facility.

## 2013-05-17 ENCOUNTER — Emergency Department (HOSPITAL_COMMUNITY)
Admission: EM | Admit: 2013-05-17 | Discharge: 2013-05-17 | Discharge status: Against Medical Advice | Payer: Medicare Other | Attending: Emergency Medicine | Admitting: Emergency Medicine

## 2013-05-17 ENCOUNTER — Encounter (HOSPITAL_COMMUNITY): Payer: Self-pay | Admitting: Emergency Medicine

## 2013-05-17 ENCOUNTER — Emergency Department (INDEPENDENT_AMBULATORY_CARE_PROVIDER_SITE_OTHER): Payer: Medicare Other

## 2013-05-17 ENCOUNTER — Emergency Department (INDEPENDENT_AMBULATORY_CARE_PROVIDER_SITE_OTHER)
Admission: EM | Admit: 2013-05-17 | Discharge: 2013-05-17 | Disposition: A | Discharge status: Nursing Home (Includes ICF) | Payer: Medicare Other | Source: Home / Self Care | Attending: Emergency Medicine | Admitting: Emergency Medicine

## 2013-05-17 DIAGNOSIS — E871 Hypo-osmolality and hyponatremia: Secondary | ICD-10-CM

## 2013-05-17 DIAGNOSIS — I2510 Atherosclerotic heart disease of native coronary artery without angina pectoris: Secondary | ICD-10-CM | POA: Insufficient documentation

## 2013-05-17 DIAGNOSIS — I10 Essential (primary) hypertension: Secondary | ICD-10-CM | POA: Insufficient documentation

## 2013-05-17 DIAGNOSIS — Z9861 Coronary angioplasty status: Secondary | ICD-10-CM | POA: Insufficient documentation

## 2013-05-17 DIAGNOSIS — R05 Cough: Secondary | ICD-10-CM

## 2013-05-17 DIAGNOSIS — J441 Chronic obstructive pulmonary disease with (acute) exacerbation: Secondary | ICD-10-CM

## 2013-05-17 DIAGNOSIS — Z87891 Personal history of nicotine dependence: Secondary | ICD-10-CM | POA: Insufficient documentation

## 2013-05-17 DIAGNOSIS — E119 Type 2 diabetes mellitus without complications: Secondary | ICD-10-CM | POA: Insufficient documentation

## 2013-05-17 DIAGNOSIS — I509 Heart failure, unspecified: Secondary | ICD-10-CM | POA: Insufficient documentation

## 2013-05-17 DIAGNOSIS — I252 Old myocardial infarction: Secondary | ICD-10-CM | POA: Insufficient documentation

## 2013-05-17 DIAGNOSIS — R059 Cough, unspecified: Secondary | ICD-10-CM | POA: Insufficient documentation

## 2013-05-17 DIAGNOSIS — J438 Other emphysema: Secondary | ICD-10-CM | POA: Insufficient documentation

## 2013-05-17 DIAGNOSIS — J45909 Unspecified asthma, uncomplicated: Secondary | ICD-10-CM | POA: Insufficient documentation

## 2013-05-17 LAB — CBC WITH DIFFERENTIAL/PLATELET
Basophils Absolute: 0 10*3/uL (ref 0.0–0.1)
Basophils Relative: 1 % (ref 0–1)
Eosinophils Relative: 2 % (ref 0–5)
HCT: 31.9 % — ABNORMAL LOW (ref 39.0–52.0)
MCHC: 36.1 g/dL — ABNORMAL HIGH (ref 30.0–36.0)
Monocytes Absolute: 1.1 10*3/uL — ABNORMAL HIGH (ref 0.1–1.0)
Neutro Abs: 4 10*3/uL (ref 1.7–7.7)
RDW: 13.9 % (ref 11.5–15.5)

## 2013-05-17 LAB — POCT I-STAT, CHEM 8
Calcium, Ion: 1.3 mmol/L (ref 1.13–1.30)
Chloride: 90 mEq/L — ABNORMAL LOW (ref 96–112)
HCT: 37 % — ABNORMAL LOW (ref 39.0–52.0)
TCO2: 30 mmol/L (ref 0–100)

## 2013-05-17 MED ORDER — IPRATROPIUM-ALBUTEROL 0.5-2.5 (3) MG/3ML IN SOLN
3.0000 mL | Freq: Once | RESPIRATORY_TRACT | Status: AC
  Administered 2013-05-17: 3 mL via RESPIRATORY_TRACT

## 2013-05-17 MED ORDER — ALBUTEROL SULFATE (5 MG/ML) 0.5% IN NEBU
INHALATION_SOLUTION | RESPIRATORY_TRACT | Status: AC
  Filled 2013-05-17: qty 0.5

## 2013-05-17 NOTE — ED Provider Notes (Signed)
CSN: [id number]     Arrival date & time 05/17/13  1828 History   First MD Initiated Contact with Patient 05/17/13 1906     Chief Complaint  Patient presents with  . Cough   (Consider location/radiation/quality/duration/timing/severity/associated sxs/prior Treatment) HPI Comments: 79 year old male presents complaining of cough for the past 10 days. He has a history of COPD, being admitted with pneumonia multiple times in the past. The cough has been productive of sputum. Apart from the cough, he has not been particularly short of breath. He denies any chest pain, pleuritic or otherwise. He denies fever, chills, NVD, sick contacts, recent travel. He does not feel like he has pneumonia right now, he feels like this is just more for cough.  Patient is a 79 y.o. male presenting with cough.  Cough Associated symptoms: shortness of breath (chronic due to COPD)   Associated symptoms: no chest pain, no chills, no fever, no myalgias, no rash and no sore throat     Past Medical History  Diagnosis Date  . Aortic stenosis   . High cholesterol   . COPD (chronic obstructive pulmonary disease)   . Retinal artery occlusion   . Emphysema   . GERD (gastroesophageal reflux disease)   . Coronary artery disease   . Heart murmur   . On home oxygen therapy     "2L all the time" (01/02/2013)  . Angina   . Hypertension   . CHF (congestive heart failure)   . MI (myocardial infarction)     "I've had several; last one was 03/09/2012" (01/02/2013  . Asthma   . Pneumonia     "today; have had it at least twice before" (01/02/2013)  . Shortness of breath     "most of the time recently" (01/02/2013)  . Chronic bronchitis   . Type II diabetes mellitus   . History of stomach ulcers     "have them now; they flare up when I eat the wrong thing or don't eat" (01/02/2013)  . Hepatitis     "don't know which one" (01/02/2013)  . WUJWJXBJ(478.2)     "probably weekly" (01/02/2013)  . Stroke     "several; left ankle/foot still  weak from one of them" (01/02/2013)  . Arthritis     "all over" (01/02/2013)  . DJD (degenerative joint disease)     "all over" (01/02/2013)  . Chronic lower back pain   . Urinary urgency   . Prostate cancer     "had radiation treatments" (01/02/2013)  . Basal cell carcinoma     "left nose, behind right knee" (01/02/2013)  . Anemia     chronic/notes 01/02/2013   Past Surgical History  Procedure Laterality Date  . Shoulder arthroscopy w/ rotator cuff repair Right 1990's; 2000's    "they've operated on it twice" (01/02/2013)  . Sp pta peripheral      Bilateral leg stents  . Inguinal hernia repair Bilateral     "1 wk apart" (01/02/2013)  . Coronary stent placement      "I've had 7 or 8 stents put in my heart" (01/02/2013)  . Cardiac catheterization      "couldn't get new type of stents put in" (01/02/2013)  . Coronary angioplasty with stent placement      "I've had 7 or 8 stents put in my heart; last stents put in ~ 2 wk ago" (01/02/2013)  . Back surgery    . Anterior fusion cervical spine      "C5-7; took bone off  my right hip" (5-01/2013)   Family History  Problem Relation Age of Onset  . Coronary artery disease Brother 10  . Coronary artery disease Sister    History  Substance Use Topics  . Smoking status: Never Smoker   . Smokeless tobacco: Former User    Types: Chew     Comment: 01/02/2013 "chewed in high school playing ball"  . Alcohol Use: No    Review of Systems  Constitutional: Negative for fever, chills and fatigue.  HENT: Negative for sore throat, neck pain and neck stiffness.   Eyes: Negative for visual disturbance.  Respiratory: Positive for cough and shortness of breath (chronic due to COPD).   Cardiovascular: Negative for chest pain, palpitations and leg swelling.  Gastrointestinal: Negative for nausea, vomiting, abdominal pain, diarrhea and constipation.  Genitourinary: Negative for dysuria, urgency, frequency and hematuria.  Musculoskeletal: Negative for myalgias and  arthralgias.  Skin: Negative for rash.  Neurological: Negative for dizziness, weakness and light-headedness.    Allergies  Iodine and Iohexol  Home Medications   Current Outpatient Rx  Name  Route  Sig  Dispense  Refill  . acetaminophen (TYLENOL) 500 MG tablet   Oral   Take 500 mg by mouth every 6 (six) hours as needed for pain.         Marland Kitchen albuterol (PROVENTIL) (5 MG/ML) 0.5% nebulizer solution   Nebulization   Take 0.5 mLs (2.5 mg total) by nebulization 3 (three) times daily.   20 mL      . aspirin 81 MG tablet   Oral   Take 81 mg by mouth daily.           Marland Kitchen atorvastatin (LIPITOR) 80 MG tablet   Oral   Take 80 mg by mouth at bedtime.         . cetirizine (ZYRTEC) 10 MG tablet   Oral   Take 10 mg by mouth daily as needed for allergies.         Marland Kitchen clopidogrel (PLAVIX) 75 MG tablet   Oral   Take 1 tablet (75 mg total) by mouth daily with breakfast.         . feeding supplement (GLUCERNA SHAKE) LIQD   Oral   Take 237 mLs by mouth daily.         . ferrous sulfate 325 (65 FE) MG tablet   Oral   Take 650 mg by mouth every evening.         . furosemide (LASIX) 20 MG tablet   Oral   Take 1 tablet (20 mg total) by mouth daily with breakfast.   30 tablet      . gabapentin (NEURONTIN) 300 MG capsule   Oral   Take 300 mg by mouth at bedtime.           . insulin aspart (NOVOLOG) 100 UNIT/ML injection      0-15 Units, Subcutaneous, 3 times daily with meals CBG < 70: implement hypoglycemia protocol CBG 70 - 120: 0 units CBG 121 - 150: 2 units CBG 151 - 200: 3 units CBG 201 - 250: 5 units CBG 251 - 300: 8 units CBG 301 - 350: 11 units CBG 351 - 400: 15 units CBG > 400: call MD   1 vial      . ipratropium (ATROVENT) 0.02 % nebulizer solution   Nebulization   Take 2.5 mLs (0.5 mg total) by nebulization 3 (three) times daily.   75 mL      . isosorbide  dinitrate (ISORDIL) 30 MG tablet   Oral   Take 30 mg by mouth 3 (three) times daily.          Marland Kitchen levofloxacin (LEVAQUIN) 750 MG tablet   Oral   Take 1 tablet (750 mg total) by mouth daily. For 4 more days from 5/9 and then stop         . losartan (COZAAR) 25 MG tablet   Oral   Take 25 mg by mouth every evening.         . metFORMIN (GLUCOPHAGE) 500 MG tablet   Oral   Take 500 mg by mouth 2 (two) times daily with a meal.         . metoprolol (LOPRESSOR) 50 MG tablet   Oral   Take 25 mg by mouth 2 (two) times daily.         . mometasone (ASMANEX) 220 MCG/INH inhaler   Inhalation   Inhale 2 puffs into the lungs at bedtime.          . mupirocin ointment (BACTROBAN) 2 %   Topical   Apply 1 application topically daily as needed (face).         . nitroGLYCERIN (NITROSTAT) 0.4 MG SL tablet   Sublingual   Place 0.4 mg under the tongue every 5 (five) minutes as needed for chest pain.         Marland Kitchen omeprazole (PRILOSEC) 40 MG capsule   Oral   Take 1 capsule (40 mg total) by mouth 2 (two) times daily before a meal.         . polyvinyl alcohol (LIQUIFILM TEARS) 1.4 % ophthalmic solution   Both Eyes   Place 1 drop into both eyes as needed.   15 mL      . predniSONE (DELTASONE) 10 MG tablet      Take 4 tablets daily for 2 days, then,  Take 3 tablets daily for 2 days, then, Take 2 tablets daily for 2 days, then,  Take 1 tablet for one day and then stop         . ranolazine (RANEXA) 500 MG 12 hr tablet   Oral   Take 500 mg by mouth 2 (two) times daily.           . Rivaroxaban (XARELTO) 15 MG TABS tablet   Oral   Take 1 tablet (15 mg total) by mouth 2 (two) times daily. Stop on 01/25/13-and then change to 20 mg once a day   30 tablet      . Rivaroxaban (XARELTO) 20 MG TABS   Oral   Take 1 tablet (20 mg total) by mouth daily with supper. First dose on Thu 01/25/13   30 tablet      . terazosin (HYTRIN) 5 MG capsule   Oral   Take 5 mg by mouth 2 (two) times daily.           . Travoprost, BAK Free, (TRAVATAN) 0.004 % SOLN ophthalmic solution    Both Eyes   Place 1 drop into both eyes at bedtime.            BP 139/54  Pulse 73  Temp(Src) 98.1 F (36.7 C) (Oral)  Resp 16  SpO2 94% Physical Exam  Nursing note and vitals reviewed. Constitutional: He is oriented to person, place, and time. He appears well-developed and well-nourished. No distress.  HENT:  Head: Normocephalic.  Pulmonary/Chest: Effort normal. No respiratory distress. He has wheezes (diffuse). He has rales in  the right upper field, the right middle field and the right lower field.  Neurological: He is alert and oriented to person, place, and time. Coordination normal.  Skin: Skin is warm and dry. No rash noted. He is not diaphoretic.  Psychiatric: He has a normal mood and affect. Judgment normal.    ED Course  Procedures (including critical care time) Labs Review Labs Reviewed  CBC WITH DIFFERENTIAL - Abnormal; Notable for the following:    RBC 3.96 (*)    Hemoglobin 11.5 (*)    HCT 31.9 (*)    MCHC 36.1 (*)    Monocytes Relative 18 (*)    Monocytes Absolute 1.1 (*)    All other components within normal limits  POCT I-STAT, CHEM 8 - Abnormal; Notable for the following:    Sodium 127 (*)    Chloride 90 (*)    Glucose, Bld 109 (*)    Hemoglobin 12.6 (*)    HCT 37.0 (*)    All other components within normal limits   Imaging Review Dg Chest 2 View  05/17/2013   CLINICAL DATA:  Coughing and history of pneumonia.  EXAM: CHEST  2 VIEW  COMPARISON:  01/02/2013  FINDINGS: Two views of the chest demonstrate clear lungs. There is no focal airspace disease and no evidence for pulmonary edema. Bony thorax is intact. Heart and mediastinum are within normal limits.  IMPRESSION: No acute cardiopulmonary disease.   Electronically Signed   By: Richarda Overlie M.D.   On: 05/17/2013 20:42    MDM   1. COPD exacerbation   2. Cough   3. Hyponatremia    Clinically, patient seems well. However, there is significant hyponatremia. By my read, there is a new hypodensity in  the right middle lung as well which may be indicative of early pneumonia. Given the level of hyponatremia, patient is being transferred to the emergency department for further evaluation and treatment.    Graylon Good, PA-C 05/17/13 2108

## 2013-05-17 NOTE — ED Provider Notes (Signed)
Medical screening examination/treatment/procedure(s) were performed by non-physician practitioner and as supervising physician I was immediately available for consultation/collaboration.  Leslee Home, M.D.  Reuben Likes, MD 05/17/13 2156

## 2013-05-17 NOTE — ED Notes (Signed)
C/o productive cough.  Admits to having nasal drainage.  Denies facial pressure, vomiting.

## 2013-05-17 NOTE — ED Notes (Signed)
Pt. was seen at [facility] this evening  for persistent productive cough and chest congestion ( chest x-ray done )  diagnosed with COPD exacerbation / Hyponatremia , transferred here for further evaluation and management of hyponatremia.

## 2013-05-18 ENCOUNTER — Observation Stay (HOSPITAL_COMMUNITY): Payer: Medicare Other

## 2013-05-18 ENCOUNTER — Encounter (HOSPITAL_COMMUNITY): Payer: Self-pay | Admitting: Emergency Medicine

## 2013-05-18 ENCOUNTER — Emergency Department (HOSPITAL_COMMUNITY): Payer: Medicare Other

## 2013-05-18 ENCOUNTER — Other Ambulatory Visit: Payer: Self-pay

## 2013-05-18 ENCOUNTER — Inpatient Hospital Stay (HOSPITAL_COMMUNITY)
Admission: EM | Admit: 2013-05-18 | Discharge: 2013-05-21 | DRG: 190 | Disposition: A | Discharge status: Home / Self Care | Payer: Medicare Other | Attending: Internal Medicine | Admitting: Internal Medicine

## 2013-05-18 DIAGNOSIS — Z7901 Long term (current) use of anticoagulants: Secondary | ICD-10-CM

## 2013-05-18 DIAGNOSIS — I5030 Unspecified diastolic (congestive) heart failure: Secondary | ICD-10-CM

## 2013-05-18 DIAGNOSIS — Z7982 Long term (current) use of aspirin: Secondary | ICD-10-CM

## 2013-05-18 DIAGNOSIS — Z923 Personal history of irradiation: Secondary | ICD-10-CM

## 2013-05-18 DIAGNOSIS — E119 Type 2 diabetes mellitus without complications: Secondary | ICD-10-CM

## 2013-05-18 DIAGNOSIS — I5032 Chronic diastolic (congestive) heart failure: Secondary | ICD-10-CM | POA: Diagnosis present

## 2013-05-18 DIAGNOSIS — I10 Essential (primary) hypertension: Secondary | ICD-10-CM | POA: Diagnosis present

## 2013-05-18 DIAGNOSIS — I359 Nonrheumatic aortic valve disorder, unspecified: Secondary | ICD-10-CM | POA: Diagnosis present

## 2013-05-18 DIAGNOSIS — I82409 Acute embolism and thrombosis of unspecified deep veins of unspecified lower extremity: Secondary | ICD-10-CM | POA: Diagnosis present

## 2013-05-18 DIAGNOSIS — K219 Gastro-esophageal reflux disease without esophagitis: Secondary | ICD-10-CM | POA: Diagnosis present

## 2013-05-18 DIAGNOSIS — J9620 Acute and chronic respiratory failure, unspecified whether with hypoxia or hypercapnia: Secondary | ICD-10-CM

## 2013-05-18 DIAGNOSIS — Z85828 Personal history of other malignant neoplasm of skin: Secondary | ICD-10-CM

## 2013-05-18 DIAGNOSIS — I2510 Atherosclerotic heart disease of native coronary artery without angina pectoris: Secondary | ICD-10-CM

## 2013-05-18 DIAGNOSIS — F17200 Nicotine dependence, unspecified, uncomplicated: Secondary | ICD-10-CM | POA: Diagnosis present

## 2013-05-18 DIAGNOSIS — Z9981 Dependence on supplemental oxygen: Secondary | ICD-10-CM

## 2013-05-18 DIAGNOSIS — E785 Hyperlipidemia, unspecified: Secondary | ICD-10-CM | POA: Diagnosis present

## 2013-05-18 DIAGNOSIS — R55 Syncope and collapse: Secondary | ICD-10-CM

## 2013-05-18 DIAGNOSIS — J9610 Chronic respiratory failure, unspecified whether with hypoxia or hypercapnia: Secondary | ICD-10-CM

## 2013-05-18 DIAGNOSIS — I509 Heart failure, unspecified: Secondary | ICD-10-CM

## 2013-05-18 DIAGNOSIS — Z79899 Other long term (current) drug therapy: Secondary | ICD-10-CM

## 2013-05-18 DIAGNOSIS — E871 Hypo-osmolality and hyponatremia: Secondary | ICD-10-CM

## 2013-05-18 DIAGNOSIS — D649 Anemia, unspecified: Secondary | ICD-10-CM | POA: Diagnosis present

## 2013-05-18 DIAGNOSIS — Z8711 Personal history of peptic ulcer disease: Secondary | ICD-10-CM

## 2013-05-18 DIAGNOSIS — Z8673 Personal history of transient ischemic attack (TIA), and cerebral infarction without residual deficits: Secondary | ICD-10-CM

## 2013-05-18 DIAGNOSIS — J449 Chronic obstructive pulmonary disease, unspecified: Secondary | ICD-10-CM | POA: Diagnosis present

## 2013-05-18 DIAGNOSIS — Z8546 Personal history of malignant neoplasm of prostate: Secondary | ICD-10-CM

## 2013-05-18 DIAGNOSIS — I4891 Unspecified atrial fibrillation: Secondary | ICD-10-CM | POA: Diagnosis present

## 2013-05-18 DIAGNOSIS — I825Z9 Chronic embolism and thrombosis of unspecified deep veins of unspecified distal lower extremity: Secondary | ICD-10-CM | POA: Diagnosis present

## 2013-05-18 DIAGNOSIS — I959 Hypotension, unspecified: Secondary | ICD-10-CM | POA: Diagnosis present

## 2013-05-18 DIAGNOSIS — Z9861 Coronary angioplasty status: Secondary | ICD-10-CM

## 2013-05-18 DIAGNOSIS — Z981 Arthrodesis status: Secondary | ICD-10-CM

## 2013-05-18 DIAGNOSIS — Z66 Do not resuscitate: Secondary | ICD-10-CM | POA: Diagnosis present

## 2013-05-18 DIAGNOSIS — J441 Chronic obstructive pulmonary disease with (acute) exacerbation: Principal | ICD-10-CM

## 2013-05-18 DIAGNOSIS — I252 Old myocardial infarction: Secondary | ICD-10-CM

## 2013-05-18 LAB — CBC
HCT: 33.4 % — ABNORMAL LOW (ref 39.0–52.0)
Hemoglobin: 11.8 g/dL — ABNORMAL LOW (ref 13.0–17.0)
MCH: 28.7 pg (ref 26.0–34.0)
MCV: 81.3 fL (ref 78.0–100.0)
Platelets: 191 10*3/uL (ref 150–400)
RBC: 4.11 MIL/uL — ABNORMAL LOW (ref 4.22–5.81)
WBC: 6.6 10*3/uL (ref 4.0–10.5)

## 2013-05-18 LAB — MRSA PCR SCREENING: MRSA by PCR: NEGATIVE

## 2013-05-18 LAB — URINALYSIS, ROUTINE W REFLEX MICROSCOPIC
Bilirubin Urine: NEGATIVE
Hgb urine dipstick: NEGATIVE
Nitrite: NEGATIVE
Protein, ur: NEGATIVE mg/dL
Specific Gravity, Urine: 1.009 (ref 1.005–1.030)
Urobilinogen, UA: 0.2 mg/dL (ref 0.0–1.0)

## 2013-05-18 LAB — COMPREHENSIVE METABOLIC PANEL
ALT: 17 U/L (ref 0–53)
AST: 16 U/L (ref 0–37)
CO2: 29 mEq/L (ref 19–32)
Calcium: 10.2 mg/dL (ref 8.4–10.5)
Chloride: 87 mEq/L — ABNORMAL LOW (ref 96–112)
Creatinine, Ser: 1.14 mg/dL (ref 0.50–1.35)
GFR calc Af Amer: 67 mL/min — ABNORMAL LOW (ref >90)
GFR calc non Af Amer: 58 mL/min — ABNORMAL LOW (ref >90)
Glucose, Bld: 181 mg/dL — ABNORMAL HIGH (ref 70–99)
Total Bilirubin: 0.3 mg/dL (ref 0.3–1.2)

## 2013-05-18 LAB — TROPONIN I
Troponin I: <0.3 ng/mL (ref <0.30)
Troponin I: <0.3 ng/mL (ref <0.30)

## 2013-05-18 LAB — GLUCOSE, CAPILLARY
Glucose-Capillary: 188 mg/dL — ABNORMAL HIGH (ref 70–99)
Glucose-Capillary: 163 mg/dL — ABNORMAL HIGH (ref 70–99)

## 2013-05-18 MED ORDER — FUROSEMIDE 10 MG/ML IJ SOLN
40.0000 mg | Freq: Once | INTRAMUSCULAR | Status: AC
  Administered 2013-05-18: 40 mg via INTRAVENOUS
  Filled 2013-05-18: qty 4

## 2013-05-18 MED ORDER — LOSARTAN POTASSIUM 25 MG PO TABS
25.0000 mg | ORAL_TABLET | Freq: Every evening | ORAL | Status: DC
  Administered 2013-05-18: 17:00:00 25 mg via ORAL
  Filled 2013-05-18: qty 1

## 2013-05-18 MED ORDER — ASPIRIN EC 81 MG PO TBEC
81.0000 mg | DELAYED_RELEASE_TABLET | Freq: Every day | ORAL | Status: DC
  Administered 2013-05-18 – 2013-05-21 (×4): 81 mg via ORAL
  Filled 2013-05-18 (×4): qty 1

## 2013-05-18 MED ORDER — RIVAROXABAN 20 MG PO TABS
20.0000 mg | ORAL_TABLET | Freq: Every day | ORAL | Status: DC
  Administered 2013-05-18 – 2013-05-20 (×3): 20 mg via ORAL
  Filled 2013-05-18 (×4): qty 1

## 2013-05-18 MED ORDER — POLYVINYL ALCOHOL 1.4 % OP SOLN: 1.0000 [drp] | OPHTHALMIC | Status: DC | PRN

## 2013-05-18 MED ORDER — ALBUTEROL SULFATE (5 MG/ML) 0.5% IN NEBU
2.5000 mg | INHALATION_SOLUTION | RESPIRATORY_TRACT | Status: DC | PRN
  Administered 2013-05-18 – 2013-05-19 (×2): 2.5 mg via RESPIRATORY_TRACT
  Filled 2013-05-18 (×3): qty 0.5

## 2013-05-18 MED ORDER — ALBUTEROL SULFATE HFA 108 (90 BASE) MCG/ACT IN AERS
2.0000 | INHALATION_SPRAY | Freq: Four times a day (QID) | RESPIRATORY_TRACT | Status: DC | PRN
  Filled 2013-05-18: qty 6.7

## 2013-05-18 MED ORDER — SODIUM CHLORIDE 0.9 % IJ SOLN: 3.0000 mL | INTRAMUSCULAR | Status: DC | PRN

## 2013-05-18 MED ORDER — TICAGRELOR 90 MG PO TABS
90.0000 mg | ORAL_TABLET | Freq: Two times a day (BID) | ORAL | Status: DC
  Administered 2013-05-18 – 2013-05-21 (×6): 90 mg via ORAL
  Filled 2013-05-18 (×8): qty 1

## 2013-05-18 MED ORDER — LEVOFLOXACIN 250 MG PO TABS
250.0000 mg | ORAL_TABLET | Freq: Every day | ORAL | Status: DC
  Administered 2013-05-19 – 2013-05-20 (×2): 250 mg via ORAL
  Filled 2013-05-18 (×3): qty 1

## 2013-05-18 MED ORDER — GLUCERNA SHAKE PO LIQD: 237.0000 mL | Freq: Two times a day (BID) | ORAL | Status: DC | PRN

## 2013-05-18 MED ORDER — SODIUM CHLORIDE 0.9 % IV SOLN
250.0000 mL | INTRAVENOUS | Status: DC | PRN
  Administered 2013-05-19: 01:00:00 via INTRAVENOUS

## 2013-05-18 MED ORDER — IPRATROPIUM BROMIDE 0.02 % IN SOLN
0.5000 mg | RESPIRATORY_TRACT | Status: DC | PRN
  Administered 2013-05-18: 0.5 mg via RESPIRATORY_TRACT
  Filled 2013-05-18: qty 2.5

## 2013-05-18 MED ORDER — NITROGLYCERIN 0.4 MG SL SUBL
0.4000 mg | SUBLINGUAL_TABLET | SUBLINGUAL | Status: DC | PRN
  Administered 2013-05-19 (×2): 0.4 mg via SUBLINGUAL
  Filled 2013-05-18: qty 25

## 2013-05-18 MED ORDER — GADOBENATE DIMEGLUMINE 529 MG/ML IV SOLN
15.0000 mL | Freq: Once | INTRAVENOUS | Status: AC | PRN
  Administered 2013-05-18: 15 mL via INTRAVENOUS

## 2013-05-18 MED ORDER — METFORMIN HCL 500 MG PO TABS
500.0000 mg | ORAL_TABLET | Freq: Two times a day (BID) | ORAL | Status: DC
  Administered 2013-05-18: 500 mg via ORAL
  Filled 2013-05-18 (×2): qty 1

## 2013-05-18 MED ORDER — METOPROLOL TARTRATE 25 MG PO TABS
25.0000 mg | ORAL_TABLET | Freq: Two times a day (BID) | ORAL | Status: DC
  Filled 2013-05-18: qty 1

## 2013-05-18 MED ORDER — ALBUTEROL SULFATE HFA 108 (90 BASE) MCG/ACT IN AERS: 2.0000 | INHALATION_SPRAY | Freq: Four times a day (QID) | RESPIRATORY_TRACT | Status: DC

## 2013-05-18 MED ORDER — RANOLAZINE ER 500 MG PO TB12
500.0000 mg | ORAL_TABLET | Freq: Two times a day (BID) | ORAL | Status: DC
  Administered 2013-05-18 – 2013-05-21 (×6): 500 mg via ORAL
  Filled 2013-05-18 (×8): qty 1

## 2013-05-18 MED ORDER — INSULIN ASPART 100 UNIT/ML ~~LOC~~ SOLN
0.0000 [IU] | Freq: Every day | SUBCUTANEOUS | Status: DC
  Administered 2013-05-19 – 2013-05-20 (×2): 2 [IU] via SUBCUTANEOUS

## 2013-05-18 MED ORDER — PANTOPRAZOLE SODIUM 40 MG PO TBEC
40.0000 mg | DELAYED_RELEASE_TABLET | Freq: Every day | ORAL | Status: DC
  Administered 2013-05-19 – 2013-05-21 (×3): 40 mg via ORAL
  Filled 2013-05-18 (×3): qty 1

## 2013-05-18 MED ORDER — SODIUM CHLORIDE 0.9 % IJ SOLN
3.0000 mL | Freq: Two times a day (BID) | INTRAMUSCULAR | Status: DC
  Administered 2013-05-18: 17:00:00 3 mL via INTRAVENOUS

## 2013-05-18 MED ORDER — FLUTICASONE PROPIONATE HFA 44 MCG/ACT IN AERO
2.0000 | INHALATION_SPRAY | Freq: Two times a day (BID) | RESPIRATORY_TRACT | Status: DC
  Filled 2013-05-18 (×2): qty 10.6

## 2013-05-18 MED ORDER — ATORVASTATIN CALCIUM 80 MG PO TABS
80.0000 mg | ORAL_TABLET | Freq: Every day | ORAL | Status: DC
Start: 2013-05-18 — End: 2013-05-18
  Filled 2013-05-18: qty 1

## 2013-05-18 MED ORDER — FERROUS SULFATE 325 (65 FE) MG PO TABS
650.0000 mg | ORAL_TABLET | Freq: Every evening | ORAL | Status: DC
  Administered 2013-05-18 – 2013-05-20 (×3): 650 mg via ORAL
  Filled 2013-05-18 (×4): qty 2

## 2013-05-18 MED ORDER — TAMSULOSIN HCL 0.4 MG PO CAPS
0.4000 mg | ORAL_CAPSULE | Freq: Every day | ORAL | Status: DC
  Administered 2013-05-18: 0.4 mg via ORAL
  Filled 2013-05-18: qty 1

## 2013-05-18 MED ORDER — LEVOFLOXACIN 500 MG PO TABS: 500.0000 mg | ORAL_TABLET | Freq: Every day | ORAL | Status: DC

## 2013-05-18 MED ORDER — LATANOPROST 0.005 % OP SOLN
1.0000 [drp] | Freq: Every day | OPHTHALMIC | Status: DC
  Administered 2013-05-18 – 2013-05-20 (×3): 1 [drp] via OPHTHALMIC
  Filled 2013-05-18 (×2): qty 2.5

## 2013-05-18 MED ORDER — FLUTICASONE PROPIONATE HFA 44 MCG/ACT IN AERO
2.0000 | INHALATION_SPRAY | Freq: Two times a day (BID) | RESPIRATORY_TRACT | Status: DC
  Administered 2013-05-18 – 2013-05-21 (×6): 2 via RESPIRATORY_TRACT

## 2013-05-18 MED ORDER — FLUTICASONE PROPIONATE HFA 44 MCG/ACT IN AERO: 2.0000 | INHALATION_SPRAY | Freq: Four times a day (QID) | RESPIRATORY_TRACT | Status: DC

## 2013-05-18 MED ORDER — LEVOFLOXACIN 500 MG PO TABS
500.0000 mg | ORAL_TABLET | Freq: Once | ORAL | Status: AC
  Administered 2013-05-18: 500 mg via ORAL
  Filled 2013-05-18: qty 1

## 2013-05-18 MED ORDER — GABAPENTIN 300 MG PO CAPS
300.0000 mg | ORAL_CAPSULE | Freq: Every day | ORAL | Status: DC
Start: 2013-05-18 — End: 2013-05-21
  Administered 2013-05-18 – 2013-05-20 (×3): 300 mg via ORAL
  Filled 2013-05-18 (×5): qty 1

## 2013-05-18 MED ORDER — INSULIN ASPART 100 UNIT/ML ~~LOC~~ SOLN
0.0000 [IU] | Freq: Three times a day (TID) | SUBCUTANEOUS | Status: DC
  Administered 2013-05-19: 2 [IU] via SUBCUTANEOUS
  Administered 2013-05-19: 3 [IU] via SUBCUTANEOUS
  Administered 2013-05-19 – 2013-05-20 (×2): 2 [IU] via SUBCUTANEOUS
  Administered 2013-05-20: 5 [IU] via SUBCUTANEOUS
  Administered 2013-05-20 – 2013-05-21 (×3): 1 [IU] via SUBCUTANEOUS

## 2013-05-18 MED ORDER — ONDANSETRON HCL 4 MG/2ML IJ SOLN
4.0000 mg | Freq: Four times a day (QID) | INTRAMUSCULAR | Status: DC | PRN
  Administered 2013-05-18: 4 mg via INTRAVENOUS
  Filled 2013-05-18: qty 2

## 2013-05-18 MED ORDER — PREDNISONE 20 MG PO TABS
40.0000 mg | ORAL_TABLET | Freq: Every day | ORAL | Status: DC
  Administered 2013-05-18 – 2013-05-21 (×4): 40 mg via ORAL
  Filled 2013-05-18 (×4): qty 2

## 2013-05-18 MED ORDER — ISOSORBIDE DINITRATE 30 MG PO TABS
30.0000 mg | ORAL_TABLET | Freq: Three times a day (TID) | ORAL | Status: DC
  Administered 2013-05-18: 30 mg via ORAL
  Filled 2013-05-18 (×2): qty 1

## 2013-05-18 MED ORDER — ACETAMINOPHEN 325 MG PO TABS: 650.0000 mg | ORAL_TABLET | ORAL | Status: DC | PRN

## 2013-05-18 MED ORDER — IPRATROPIUM BROMIDE 0.02 % IN SOLN
0.5000 mg | Freq: Four times a day (QID) | RESPIRATORY_TRACT | Status: DC
  Administered 2013-05-19 – 2013-05-20 (×8): 0.5 mg via RESPIRATORY_TRACT
  Filled 2013-05-18 (×8): qty 2.5

## 2013-05-18 NOTE — Progress Notes (Signed)
Utilization Review Completed.   Haadi Santellan, RN, BSN Nurse Case Manager  [phone]  

## 2013-05-18 NOTE — H&P (Signed)
Date: 05/18/2013               Patient Name:  Eugene Bauer MRN: [id number]  DOB: 05-29-1927 Age / Sex: 79 y.o., male   PCP: Janae Sauce, MD         Medical Service: Internal Medicine Teaching Service         Attending Physician: Dr. Burns Spain, MD    First Contact: Dr. Cheryll Dessert Pager: [phone]  Second Contact: Dr. Sherrine Maples Pager: [phone]       After Hours (After 5p/  First Contact Pager: [phone]  weekends / holidays): Second Contact Pager: [phone]   Chief Complaint: Shortness of breath and Cough.                                History of Present Illness: 56 y o male with PMH of COPD, DM, Diastolic CHF- last EF- 2012, 55-65%., Hperlipidemia and Hyponatremia. Presented today with c/o of Cough- pt has always had a cough which was previously nonproductive, but got worse about 2weeks ago, productive of small clear sputum.  Worsening SOB also started abput the same time, pt says he has always had some difficulty breathing due to his COPD, used o2 at home for a while and Inhalers, symptoms mostly controlled , but this got worse over the past 2 weeks, absent at rest, but present when he moves around in bed. Patient occasionally smokes.  No hx of fevers of chills, no chest pain, patient has not noticed any change in leg swelling, or abdominal swelling, not aware of sick contacts.   Patinet was seen at the Urgent care [facility], BMP showed Hyponatremia, and was sent to the Ed fo further eval.  In the Ed chest xray done did not show any inflitrate, CHF or pneumothorax, BMP done showed- Na- 126, Cl- 87, blood gluc- 181. Other electrolytes were normal. I stat trop- Neg, CBC- wbc- 6.6, pro- BMP- 710, usually elevated. Patinet was given 40 IV frusemide.   Meds: No current facility-administered medications for this encounter.   Current Outpatient Prescriptions  Medication Sig Dispense Refill  . acetaminophen (TYLENOL) 500 MG tablet Take 500 mg by mouth every 6 (six)  hours as needed for pain.      Marland Kitchen albuterol (PROVENTIL HFA;VENTOLIN HFA) 108 (90 BASE) MCG/ACT inhaler Inhale 2 puffs into the lungs every 6 (six) hours as needed for wheezing.      Marland Kitchen aspirin 81 MG tablet Take 81 mg by mouth daily.        Marland Kitchen atorvastatin (LIPITOR) 80 MG tablet Take 80 mg by mouth at bedtime.      . ferrous sulfate 325 (65 FE) MG tablet Take 650 mg by mouth every evening.      . gabapentin (NEURONTIN) 300 MG capsule Take 300 mg by mouth at bedtime.        . isosorbide dinitrate (ISORDIL) 30 MG tablet Take 30 mg by mouth 3 (three) times daily.      Marland Kitchen losartan (COZAAR) 25 MG tablet Take 25 mg by mouth every evening.      . metFORMIN (GLUCOPHAGE) 500 MG tablet Take 500 mg by mouth 2 (two) times daily with a meal.      . metoprolol (LOPRESSOR) 50 MG tablet Take 25 mg by mouth 2 (two) times daily.      . mometasone (ASMANEX) 220 MCG/INH inhaler Inhale 2 puffs into the lungs at bedtime.       Marland Kitchen  nitroGLYCERIN (NITROSTAT) 0.4 MG SL tablet Place 0.4 mg under the tongue every 5 (five) minutes as needed for chest pain.      Marland Kitchen omeprazole (PRILOSEC) 40 MG capsule Take 1 capsule (40 mg total) by mouth 2 (two) times daily before a meal.      . polyvinyl alcohol (LIQUIFILM TEARS) 1.4 % ophthalmic solution Place 1 drop into both eyes as needed.  15 mL    . ranolazine (RANEXA) 500 MG 12 hr tablet Take 500 mg by mouth 2 (two) times daily.        . Rivaroxaban (XARELTO) 20 MG TABS Take 1 tablet (20 mg total) by mouth daily with supper. First dose on Thu 01/25/13  30 tablet    . tamsulosin (FLOMAX) 0.4 MG CAPS capsule Take 0.4 mg by mouth daily after supper.      . Ticagrelor (BRILINTA) 90 MG TABS tablet Take 90 mg by mouth 2 (two) times daily.      . Travoprost, BAK Free, (TRAVATAN) 0.004 % SOLN ophthalmic solution Place 1 drop into both eyes at bedtime.          Allergies: Allergies as of 05/18/2013 - Review Complete 05/18/2013  Allergen Reaction Noted  . Iodine Shortness Of Breath 07/28/2011  .  Iohexol  04/27/2010   Past Medical History  Diagnosis Date  . Aortic stenosis   . High cholesterol   . COPD (chronic obstructive pulmonary disease)   . Retinal artery occlusion   . Emphysema   . GERD (gastroesophageal reflux disease)   . Coronary artery disease   . Heart murmur   . On home oxygen therapy     "2L all the time" (01/02/2013)  . Angina   . Hypertension   . CHF (congestive heart failure)   . MI (myocardial infarction)     "I've had several; last one was 03/09/2012" (01/02/2013  . Asthma   . Pneumonia     "today; have had it at least twice before" (01/02/2013)  . Shortness of breath     "most of the time recently" (01/02/2013)  . Chronic bronchitis   . Type II diabetes mellitus   . History of stomach ulcers     "have them now; they flare up when I eat the wrong thing or don't eat" (01/02/2013)  . Hepatitis     "don't know which one" (01/02/2013)  . UYQIHKVQ(259.5)     "probably weekly" (01/02/2013)  . Stroke     "several; left ankle/foot still weak from one of them" (01/02/2013)  . Arthritis     "all over" (01/02/2013)  . DJD (degenerative joint disease)     "all over" (01/02/2013)  . Chronic lower back pain   . Urinary urgency   . Prostate cancer     "had radiation treatments" (01/02/2013)  . Basal cell carcinoma     "left nose, behind right knee" (01/02/2013)  . Anemia     chronic/notes 01/02/2013   Past Surgical History  Procedure Laterality Date  . Shoulder arthroscopy w/ rotator cuff repair Right 1990's; 2000's    "they've operated on it twice" (01/02/2013)  . Sp pta peripheral      Bilateral leg stents  . Inguinal hernia repair Bilateral     "1 wk apart" (01/02/2013)  . Coronary stent placement      "I've had 7 or 8 stents put in my heart" (01/02/2013)  . Cardiac catheterization      "couldn't get new type of stents put in" (01/02/2013)  .  Coronary angioplasty with stent placement      "I've had 7 or 8 stents put in my heart; last stents put in ~ 2 wk ago" (01/02/2013)  .  Back surgery    . Anterior fusion cervical spine      "C5-7; took bone off my right hip" (5-01/2013)   Family History  Problem Relation Age of Onset  . Coronary artery disease Brother 57  . Coronary artery disease Sister    History   Social History  . Marital Status: Single    Spouse Name: N/A    Number of Children: N/A  . Years of Education: N/A   Occupational History  . Not on file.   Social History Main Topics  . Smoking status: Never Smoker   . Smokeless tobacco: Former User    Types: Chew     Comment: 01/02/2013 "chewed in high school playing ball"  . Alcohol Use: No  . Drug Use: No  . Sexual Activity: No   Other Topics Concern  . Not on file   Social History Narrative  . No narrative on file    Review of Systems: Review of system No pertinent findings on ROS cept as mentioned in the HPI.  Physical Exam: Blood pressure 173/69, pulse 110, temperature 99.3 F (37.4 C), temperature source Oral, resp. rate 20, height 5\' 4"  (1.626 m), weight 170 lb (77.111 kg), SpO2 93.00%. . GENERAL- alert, co-operative, appears as stated age, on o28 HEENT- Atraumatic, normocephalic, PERRL, EOMI, oral mucosa appears moist, good and intact dentition. No carotid bruit, no cervical LN enlargement, thyroid does not appear enlarged. CARDIAC- Regular, Systolic murmur heard in aortic and mitral area, rubs or gallops. RESP- Moving equal volumes of air, mild insp wheezes anteriorly,crackles heard posteriorly at the lung bases. ABDOMEN- Soft,non tender, no palpable masses or organomegaly, bowel sounds present. BACK- Normal curvature of the spine, 4cm scarring present to the Rt of the spine- previous surg, No tenderness along the vertebrae, no CVA tenderness. NEURO- Cr N 2-12 intact. Extrem- +1 bilateral pitting edem,a to the mid shin.  SKIN- Warm, dry, No rash or lesion. PSYCH- Normal mood and affect, appropriate thought content and speech.   Lab results: Basic Metabolic Panel:  Recent  Labs  05/17/13 2055 05/18/13 1127  NA 127* 126*  K 4.2 4.4  CL 90* 87*  CO2  --  29  GLUCOSE 109* 181*  BUN 15 18  CREATININE 1.00 1.14  CALCIUM  --  10.2   Liver Function Tests:  Recent Labs  05/18/13 1127  AST 16  ALT 17  ALKPHOS 92  BILITOT 0.3  PROT 7.2  ALBUMIN 4.1   CBC:  Recent Labs  05/17/13 1945 05/17/13 2055 05/18/13 1127  WBC 6.2  --  6.6  NEUTROABS 4.0  --   --   HGB 11.5* 12.6* 11.8*  HCT 31.9* 37.0* 33.4*  MCV 80.6  --  81.3  PLT 190  --  191   BNP:  Recent Labs  05/18/13 1128  PROBNP 710.7*   Urinalysis:  Recent Labs  05/18/13 1345  COLORURINE YELLOW  LABSPEC 1.009  PHURINE 5.0  GLUCOSEU NEGATIVE  HGBUR NEGATIVE  BILIRUBINUR NEGATIVE  KETONESUR NEGATIVE  PROTEINUR NEGATIVE  UROBILINOGEN 0.2  NITRITE NEGATIVE  LEUKOCYTESUR NEGATIVE   Imaging results:  Dg Chest 2 View (if Patient Has Fever And/or Copd)  05/18/2013   CLINICAL DATA:  Shortness of Breath. Fatigue.  EXAM: CHEST  2 VIEW  COMPARISON:  05/17/2013, 01/02/2013 and  06/15/2012.  FINDINGS: Central pulmonary vascular prominence stable.  Calcified aorta.  Heart size top normal with coronary artery calcifications noted.  No infiltrate, congestive heart failure or pneumothorax.  No plain film evidence of pulmonary malignancy.  IMPRESSION: No acute abnormality. Please see above.   Electronically Signed   By: Bridgett Larsson   On: 05/18/2013 11:06   Dg Chest 2 View  05/17/2013   CLINICAL DATA:  Coughing and history of pneumonia.  EXAM: CHEST  2 VIEW  COMPARISON:  01/02/2013  FINDINGS: Two views of the chest demonstrate clear lungs. There is no focal airspace disease and no evidence for pulmonary edema. Bony thorax is intact. Heart and mediastinum are within normal limits.  IMPRESSION: No acute cardiopulmonary disease.   Electronically Signed   By: Richarda Overlie M.D.   On: 05/17/2013 20:42    Other results: EKG: ekg findings: normal EKG, normal sinus rhythm, 92bpm, normal axis, no ST or T  wave changes.  Assessment & Plan by Problem: Principal Problem:   Diastolic CHF Active Problems:   COPD (chronic obstructive pulmonary disease)   Diabetes mellitus  Diastolic CHF- Presentation with cough productive of clear sputum, with SOB, with previous hx of CHF, and presence of crackles on exam,. And pedal edema, is supportive of CHF exacerbation. But chest xray done today did not show feats of fluid overload. Patient home meds do not include frusemide. Exacerbating factors- ACS/MI, pt has hx of CAD, though no complaints of chest pain, EKG done, not concerning but will trend Troponins X3. COPD exacebation is a likley cause of SOB with cough, but no signif wheeezing present on exam. Patinet was admitted and traeted for health care assoc pneumonia in May, but absence of fever, and Normal WBC, and no suggestive findings on Chest Xray rules this out. PE is a possible etiology, but pt has been on Rivaroxaban, for DVT, also no leg pain or redness present.    - Admit to IMTS- tele. - Frusemide- 40mg  IV daily. - Trend troponins X3. - Continue home meds- Lorsatan, metoprolol, Ranolaxine for CAD, NTG, Atorvastatin.  COPD- Patient is a known smoker, uses O2 at home- 2L, says he has had SOB for up to 19yrs. Well controlled on inhalers. Clinical index of suspicion for COPD causing this acute SOB is low, but will start pt on Nebuliers, Ipratropium and Albuterol. Will consider adding steriods if patient SOB does not improve on Frusemide. - Albuterol and ipratropium Nebulizers- Q4H PRN.  DM- Home meds- 500mg  Metformin BID. CBG this morning on admission- 181. HBA1c- 07/29/2011- 6.9.  - [facility]s [facility]. - Will continue metformin on admission.   Dispo: Disposition is deferred at this time, awaiting improvement of current medical problems. Anticipated discharge in approximately 2-3 day(s).   The patient does have a current PCP [facility], MD) and does need an [facility] hospital follow-up  appointment after discharge.  The patient does not know have transportation limitations that hinder transportation to clinic appointments.  Signed: Kennis Carina, MD 05/18/2013, 2:50 PM

## 2013-05-18 NOTE — ED Provider Notes (Signed)
CSN: [id number]     Arrival date & time 05/18/13  1003 History   First MD Initiated Contact with Patient 05/18/13 1104     Chief Complaint  Patient presents with  . Fatigue  . Shortness of Breath   (Consider location/radiation/quality/duration/timing/severity/associated sxs/prior Treatment) HPI History is obtained from patient and patient's daughter patient with cough for 2 weeks. Daughter also reports that for the past few days he has been slower to respond than normal. He responds appropriately but seems to be moving more slowly. No fever. Patient seen at [facility] yesterday determined to have hyponatremia. Sent here for further evaluation. Patient and daughter report increased dyspnea worse with exertion for the past few weeks. This improved with rest. No other associated symptoms. No treatment prior to coming here today. No change in symptoms since yesterday. Past Medical History  Diagnosis Date  . Aortic stenosis   . High cholesterol   . COPD (chronic obstructive pulmonary disease)   . Retinal artery occlusion   . Emphysema   . GERD (gastroesophageal reflux disease)   . Coronary artery disease   . Heart murmur   . On home oxygen therapy     "2L all the time" (01/02/2013)  . Angina   . Hypertension   . CHF (congestive heart failure)   . MI (myocardial infarction)     "I've had several; last one was 03/09/2012" (01/02/2013  . Asthma   . Pneumonia     "today; have had it at least twice before" (01/02/2013)  . Shortness of breath     "most of the time recently" (01/02/2013)  . Chronic bronchitis   . Type II diabetes mellitus   . History of stomach ulcers     "have them now; they flare up when I eat the wrong thing or don't eat" (01/02/2013)  . Hepatitis     "don't know which one" (01/02/2013)  . NUUVOZDG(644.0)     "probably weekly" (01/02/2013)  . Stroke     "several; left ankle/foot still weak from one of them" (01/02/2013)  . Arthritis     "all over" (01/02/2013)  .  DJD (degenerative joint disease)     "all over" (01/02/2013)  . Chronic lower back pain   . Urinary urgency   . Prostate cancer     "had radiation treatments" (01/02/2013)  . Basal cell carcinoma     "left nose, behind right knee" (01/02/2013)  . Anemia     chronic/notes 01/02/2013   not presently on oxygen therapy Past Surgical History  Procedure Laterality Date  . Shoulder arthroscopy w/ rotator cuff repair Right 1990's; 2000's    "they've operated on it twice" (01/02/2013)  . Sp pta peripheral      Bilateral leg stents  . Inguinal hernia repair Bilateral     "1 wk apart" (01/02/2013)  . Coronary stent placement      "I've had 7 or 8 stents put in my heart" (01/02/2013)  . Cardiac catheterization      "couldn't get new type of stents put in" (01/02/2013)  . Coronary angioplasty with stent placement      "I've had 7 or 8 stents put in my heart; last stents put in ~ 2 wk ago" (01/02/2013)  . Back surgery    . Anterior fusion cervical spine      "C5-7; took bone off my right hip" (5-01/2013)   Family History  Problem Relation Age of Onset  . Coronary artery disease Brother  50  . Coronary artery disease Sister    History  Substance Use Topics  . Smoking status: Never Smoker   . Smokeless tobacco: Former User    Types: Chew     Comment: 01/02/2013 "chewed in high school playing ball"  . Alcohol Use: No    Review of Systems  Constitutional: Positive for fatigue.  Respiratory: Positive for cough and shortness of breath.   Cardiovascular: Positive for leg swelling.       Swelling of legs for past several days.  Neurological: Positive for weakness.       Walks With walker. Chronic left leg weakness as his old stroke.  All other systems reviewed and are negative.    Allergies  Iodine and Iohexol  Home Medications   Current Outpatient Rx  Name  Route  Sig  Dispense  Refill  . acetaminophen (TYLENOL) 500 MG tablet   Oral   Take 500 mg by mouth every 6 (six) hours as needed for pain.          Marland Kitchen albuterol (PROVENTIL) (5 MG/ML) 0.5% nebulizer solution   Nebulization   Take 0.5 mLs (2.5 mg total) by nebulization 3 (three) times daily.   20 mL      . aspirin 81 MG tablet   Oral   Take 81 mg by mouth daily.           Marland Kitchen atorvastatin (LIPITOR) 80 MG tablet   Oral   Take 80 mg by mouth at bedtime.         . cetirizine (ZYRTEC) 10 MG tablet   Oral   Take 10 mg by mouth daily as needed for allergies.         Marland Kitchen clopidogrel (PLAVIX) 75 MG tablet   Oral   Take 1 tablet (75 mg total) by mouth daily with breakfast.         . feeding supplement (GLUCERNA SHAKE) LIQD   Oral   Take 237 mLs by mouth daily.         . ferrous sulfate 325 (65 FE) MG tablet   Oral   Take 650 mg by mouth every evening.         . furosemide (LASIX) 20 MG tablet   Oral   Take 1 tablet (20 mg total) by mouth daily with breakfast.   30 tablet      . gabapentin (NEURONTIN) 300 MG capsule   Oral   Take 300 mg by mouth at bedtime.           . insulin aspart (NOVOLOG) 100 UNIT/ML injection      0-15 Units, Subcutaneous, 3 times daily with meals CBG < 70: implement hypoglycemia protocol CBG 70 - 120: 0 units CBG 121 - 150: 2 units CBG 151 - 200: 3 units CBG 201 - 250: 5 units CBG 251 - 300: 8 units CBG 301 - 350: 11 units CBG 351 - 400: 15 units CBG > 400: call MD   1 vial      . ipratropium (ATROVENT) 0.02 % nebulizer solution   Nebulization   Take 2.5 mLs (0.5 mg total) by nebulization 3 (three) times daily.   75 mL      . isosorbide dinitrate (ISORDIL) 30 MG tablet   Oral   Take 30 mg by mouth 3 (three) times daily.         Marland Kitchen levofloxacin (LEVAQUIN) 750 MG tablet   Oral   Take 1 tablet (750 mg total) by mouth  daily. For 4 more days from 5/9 and then stop         . losartan (COZAAR) 25 MG tablet   Oral   Take 25 mg by mouth every evening.         . metFORMIN (GLUCOPHAGE) 500 MG tablet   Oral   Take 500 mg by mouth 2 (two) times daily with a meal.          . metoprolol (LOPRESSOR) 50 MG tablet   Oral   Take 25 mg by mouth 2 (two) times daily.         . mometasone (ASMANEX) 220 MCG/INH inhaler   Inhalation   Inhale 2 puffs into the lungs at bedtime.          . mupirocin ointment (BACTROBAN) 2 %   Topical   Apply 1 application topically daily as needed (face).         . nitroGLYCERIN (NITROSTAT) 0.4 MG SL tablet   Sublingual   Place 0.4 mg under the tongue every 5 (five) minutes as needed for chest pain.         Marland Kitchen omeprazole (PRILOSEC) 40 MG capsule   Oral   Take 1 capsule (40 mg total) by mouth 2 (two) times daily before a meal.         . polyvinyl alcohol (LIQUIFILM TEARS) 1.4 % ophthalmic solution   Both Eyes   Place 1 drop into both eyes as needed.   15 mL      . predniSONE (DELTASONE) 10 MG tablet      Take 4 tablets daily for 2 days, then,  Take 3 tablets daily for 2 days, then, Take 2 tablets daily for 2 days, then,  Take 1 tablet for one day and then stop         . ranolazine (RANEXA) 500 MG 12 hr tablet   Oral   Take 500 mg by mouth 2 (two) times daily.           . Rivaroxaban (XARELTO) 15 MG TABS tablet   Oral   Take 1 tablet (15 mg total) by mouth 2 (two) times daily. Stop on 01/25/13-and then change to 20 mg once a day   30 tablet      . Rivaroxaban (XARELTO) 20 MG TABS   Oral   Take 1 tablet (20 mg total) by mouth daily with supper. First dose on Thu 01/25/13   30 tablet      . terazosin (HYTRIN) 5 MG capsule   Oral   Take 5 mg by mouth 2 (two) times daily.           . Travoprost, BAK Free, (TRAVATAN) 0.004 % SOLN ophthalmic solution   Both Eyes   Place 1 drop into both eyes at bedtime.            BP 161/68  Pulse 94  Temp(Src) 98 F (36.7 C) (Oral)  Resp 18  Ht 5\' 4"  (1.626 m)  Wt 170 lb (77.111 kg)  BMI 29.17 kg/m2  SpO2 96% Physical Exam  Nursing note and vitals reviewed. Constitutional: He appears well-developed and well-nourished. No distress.  HENT:  Head:  Normocephalic and atraumatic.  Eyes: Conjunctivae are normal. Pupils are equal, round, and reactive to light.  Neck: Neck supple. No tracheal deviation present. No thyromegaly present.  Cardiovascular: Normal rate and regular rhythm.   No murmur heard. Pulmonary/Chest: Effort normal. No respiratory distress.  Diffuse scant rhonchi  Abdominal: Soft. Bowel sounds  are normal. He exhibits no distension. There is no tenderness.  Musculoskeletal: Normal range of motion. He exhibits edema. He exhibits no tenderness.  Trace pretibial pitting edema bilaterally  Neurological: He is alert. Coordination normal.  Skin: Skin is warm and dry. No rash noted.  Psychiatric: He has a normal mood and affect.    ED Course  Procedures (including critical care time) Labs Review Labs Reviewed  CBC  COMPREHENSIVE METABOLIC PANEL  URINALYSIS, ROUTINE W REFLEX MICROSCOPIC   Imaging Review Dg Chest 2 View (if Patient Has Fever And/or Copd)  05/18/2013   CLINICAL DATA:  Shortness of Breath. Fatigue.  EXAM: CHEST  2 VIEW  COMPARISON:  05/17/2013, 01/02/2013 and 06/15/2012.  FINDINGS: Central pulmonary vascular prominence stable.  Calcified aorta.  Heart size top normal with coronary artery calcifications noted.  No infiltrate, congestive heart failure or pneumothorax.  No plain film evidence of pulmonary malignancy.  IMPRESSION: No acute abnormality. Please see above.   Electronically Signed   By: Bridgett Larsson   On: 05/18/2013 11:06   Dg Chest 2 View  05/17/2013   CLINICAL DATA:  Coughing and history of pneumonia.  EXAM: CHEST  2 VIEW  COMPARISON:  01/02/2013  FINDINGS: Two views of the chest demonstrate clear lungs. There is no focal airspace disease and no evidence for pulmonary edema. Bony thorax is intact. Heart and mediastinum are within normal limits.  IMPRESSION: No acute cardiopulmonary disease.   Electronically Signed   By: Richarda Overlie M.D.   On: 05/17/2013 20:42   Date: 05/18/2013  Rate: 90  Rhythm: normal  sinus rhythm  QRS Axis: normal  Intervals: normal  ST/T Wave abnormalities: nonspecific T wave changes  Conduction Disutrbances:none  Narrative Interpretation: Septal MI age indeterminate Old EKG Reviewed: No significant change from 01/02/2013 interpreted by me CHEST XRAY VIEWED BY ME   Results for orders placed during the hospital encounter of 05/18/13  CBC      Result Value Range   WBC 6.6  4.0 - 10.5 K/uL   RBC 4.11 (*) 4.22 - 5.81 MIL/uL   Hemoglobin 11.8 (*) 13.0 - 17.0 g/dL   HCT 16.1 (*) 09.6 - 04.5 %   MCV 81.3  78.0 - 100.0 fL   MCH 28.7  26.0 - 34.0 pg   MCHC 35.3  30.0 - 36.0 g/dL   RDW 40.9  81.1 - 91.4 %   Platelets 191  150 - 400 K/uL  COMPREHENSIVE METABOLIC PANEL      Result Value Range   Sodium 126 (*) 135 - 145 mEq/L   Potassium 4.4  3.5 - 5.1 mEq/L   Chloride 87 (*) 96 - 112 mEq/L   CO2 29  19 - 32 mEq/L   Glucose, Bld 181 (*) 70 - 99 mg/dL   BUN 18  6 - 23 mg/dL   Creatinine, Ser 7.82  0.50 - 1.35 mg/dL   Calcium 95.6  8.4 - 21.3 mg/dL   Total Protein 7.2  6.0 - 8.3 g/dL   Albumin 4.1  3.5 - 5.2 g/dL   AST 16  0 - 37 U/L   ALT 17  0 - 53 U/L   Alkaline Phosphatase 92  39 - 117 U/L   Total Bilirubin 0.3  0.3 - 1.2 mg/dL   GFR calc non Af Amer 58 (*) >90 mL/min   GFR calc Af Amer 67 (*) >90 mL/min  PRO B NATRIURETIC PEPTIDE      Result Value Range   Pro B  Natriuretic peptide (BNP) 710.7 (*) 0 - 450 pg/mL  POCT I-STAT TROPONIN I      Result Value Range   Troponin i, poc 0.01  0.00 - 0.08 ng/mL   Comment 3            Dg Chest 2 View (if Patient Has Fever And/or Copd)  05/18/2013   CLINICAL DATA:  Shortness of Breath. Fatigue.  EXAM: CHEST  2 VIEW  COMPARISON:  05/17/2013, 01/02/2013 and 06/15/2012.  FINDINGS: Central pulmonary vascular prominence stable.  Calcified aorta.  Heart size top normal with coronary artery calcifications noted.  No infiltrate, congestive heart failure or pneumothorax.  No plain film evidence of pulmonary malignancy.  IMPRESSION:  No acute abnormality. Please see above.   Electronically Signed   By: Bridgett Larsson   On: 05/18/2013 11:06   Dg Chest 2 View  05/17/2013   CLINICAL DATA:  Coughing and history of pneumonia.  EXAM: CHEST  2 VIEW  COMPARISON:  01/02/2013  FINDINGS: Two views of the chest demonstrate clear lungs. There is no focal airspace disease and no evidence for pulmonary edema. Bony thorax is intact. Heart and mediastinum are within normal limits.  IMPRESSION: No acute cardiopulmonary disease.   Electronically Signed   By: Richarda Overlie M.D.   On: 05/17/2013 20:42    MDM  No diagnosis found. Clinically patient is in mild CONGESTIVE heart failure, corroborated by elevated BNP. Spoke with resident physician from internal medicine service will arrange for inpatient stay Plan diuresis, electrolyte correction Diagnosis #1 congestive heart failure #2 hyponatremia #3ANEMIA #4 HYPERGYLCEMIA    Doug Sou, MD 05/18/13 1251

## 2013-05-18 NOTE — ED Notes (Signed)
Lives in a independent living facility seen at urgent care one day ago sent to ED and discharged per family member with patient.  Here today for possible admission.  Denies pain shortness of breath with productive cough and general weakness.

## 2013-05-18 NOTE — ED Notes (Signed)
Pt given urinal.

## 2013-05-18 NOTE — Progress Notes (Signed)
Called to patients bed side, at about 5.35pm, 05/18/2013, that patient was slumped on the side of the bed, looked pale and clammy, with some facial droop, with blood pressure in the 60/50s. On arrival at patients bed side, Rapid response was present, patient looked dusky, cold and clammy, was on IV n/s 500ccs- was given bolus, was on 5L of O2 was saturating 98%. Pt complained of some Chest heaviness. EKG was done- which showed no acute changes, was similar to previous tracing. As per chat, pt got his anti htn meds, and isosabide- earlier. Patient  was easily arousable and said he was ok, there was no facial droop on examination, speech was not slurred. Blood pressure increased to 84/48 on IVF. PCCM was called and it was agreed pt should be transferred to the CCU.

## 2013-05-18 NOTE — Progress Notes (Signed)
Called to bedside by nurse tech at 1715 Per nurse tech, pt was sitting on side of bed and appeared to have right sided facial droop and urine was noted on linen. Arrived to room at 1720 and assessed pt. No facial droop noted. Grips equal. Pupils equal and reactive. Pt appeared pale, cold and clammy. BP 60/50. Pt kept repeating "I'm ok" but speech was slightly slurred. CBG 188. Placed 4L O2 via [place]. Warm blanket given. Rapid response and MD called to bedside. Once rapid response nurse arrived NS bolus was started. BP at 1815 118/52. Called 2H and gave report to [facility]. Pt's daughter notified. Pt was previously seen by me at approximately 1700 to assist with dinner tray and urinal and appeared fine at that time.   Juliane Lack, RN

## 2013-05-18 NOTE — Progress Notes (Signed)
INITIAL NUTRITION ASSESSMENT  DOCUMENTATION CODES Per approved criteria  -Not Applicable   INTERVENTION: Provide Glucerna Shakes BID PRN Encourage PO intake  NUTRITION DIAGNOSIS: Predicted suboptimal energy intake related to poor appetite as evidenced by pt's report.   Goal: Pt to meet >/= 90% of their estimated nutrition needs   Monitor:  PO intake Weight Labs  Reason for Assessment: Malnutrition Screening Tool, score of 3  79 y.o. male  Admitting Dx: Diastolic CHF  ASSESSMENT: From ED MD note: Patient and patient's daughter report patient with cough for 2 weeks. Daughter also reports that for the past few days he has been slower to respond than normal. He responds appropriately but seems to be moving more slowly. No fever. Patient seen at [facility] yesterday determined to have hyponatremia. Sent here for further evaluation. Patient and daughter report increased dyspnea worse with exertion for the past few weeks.  Pt reports that for the past year he has had nausea, accompanied by poor appetite and poor PO intake. He states his daughter is an Charity fundraiser who encourages him to eat so he eats 3 meals daily but, only a little at each meal. He reports he used to weigh 170 lbs but, has lost down to 153 lbs over the course of the year. Encouraged pt to eat as best he can. Will provide Glucerna shakes as needed to improve nutrition intake. No weight loss evidenced in weight history.  Height: Ht Readings from Last 1 Encounters:  05/18/13 5\' 4"  (1.626 m)    Weight: Wt Readings from Last 1 Encounters:  05/18/13 170 lb (77.111 kg)    Ideal Body Weight: 130 lbs  % Ideal Body Weight: 131%  Wt Readings from Last 10 Encounters:  05/18/13 170 lb (77.111 kg)  01/05/13 163 lb 12.8 oz (74.3 kg)  07/31/11 178 lb 5.6 oz (80.9 kg)    Usual Body Weight: 170 lbs  % Usual Body Weight: 100%  BMI:  Body mass index is 29.17 kg/(m^2).  Estimated Nutritional Needs: Kcal:  1600-1800 Protein: 95-105 grams Fluid: 1.5 L/day fluid restriction  Skin: +1 RLE and LLE edema; intact  Diet Order: Cardiac  EDUCATION NEEDS: -No education needs identified at this time  No intake or output data in the 24 hours ending 05/18/13 1602  Last BM: 9/18  Labs:   Recent Labs Lab 05/17/13 2055 05/18/13 1127  NA 127* 126*  K 4.2 4.4  CL 90* 87*  CO2  --  29  BUN 15 18  CREATININE 1.00 1.14  CALCIUM  --  10.2  GLUCOSE 109* 181*    CBG (last 3)   Recent Labs  05/18/13 1550  GLUCAP 154*    Scheduled Meds: . aspirin EC  81 mg Oral Daily  . atorvastatin  80 mg Oral QHS  . ferrous sulfate  650 mg Oral QPM  . fluticasone  2 puff Inhalation BID  . gabapentin  300 mg Oral QHS  . isosorbide dinitrate  30 mg Oral TID  . latanoprost  1 drop Both Eyes QHS  . losartan  25 mg Oral QPM  . metFORMIN  500 mg Oral BID WC  . metoprolol  25 mg Oral BID  . [START ON 05/19/2013] pantoprazole  40 mg Oral Daily  . ranolazine  500 mg Oral BID  . Rivaroxaban  20 mg Oral Q supper  . sodium chloride  3 mL Intravenous Q12H  . tamsulosin  0.4 mg Oral QPC supper  . Ticagrelor  90 mg Oral BID    Continuous Infusions:   Past Medical History  Diagnosis Date  . Aortic stenosis   . High cholesterol   . COPD (chronic obstructive pulmonary disease)   . Retinal artery occlusion   . Emphysema   . GERD (gastroesophageal reflux disease)   . Coronary artery disease   . Heart murmur   . On home oxygen therapy     "2L all the time" (01/02/2013)  . Angina   . Hypertension   . CHF (congestive heart failure)   . MI (myocardial infarction)     "I've had several; last one was 03/09/2012" (01/02/2013  . Asthma   . Pneumonia     "today; have had it at least twice before" (01/02/2013)  . Shortness of breath     "most of the time recently" (01/02/2013)  . Chronic bronchitis   . Type II diabetes mellitus   . History of stomach ulcers     "have them now; they flare up when I eat the wrong  thing or don't eat" (01/02/2013)  . Hepatitis     "don't know which one" (01/02/2013)  . ZOXWRUEA(540.9)     "probably weekly" (01/02/2013)  . Stroke     "several; left ankle/foot still weak from one of them" (01/02/2013)  . Arthritis     "all over" (01/02/2013)  . DJD (degenerative joint disease)     "all over" (01/02/2013)  . Chronic lower back pain   . Urinary urgency   . Prostate cancer     "had radiation treatments" (01/02/2013)  . Basal cell carcinoma     "left nose, behind right knee" (01/02/2013)  . Anemia     chronic/notes 01/02/2013    Past Surgical History  Procedure Laterality Date  . Shoulder arthroscopy w/ rotator cuff repair Right 1990's; 2000's    "they've operated on it twice" (01/02/2013)  . Sp pta peripheral      Bilateral leg stents  . Inguinal hernia repair Bilateral     "1 wk apart" (01/02/2013)  . Coronary stent placement      "I've had 7 or 8 stents put in my heart" (01/02/2013)  . Cardiac catheterization      "couldn't get new type of stents put in" (01/02/2013)  . Coronary angioplasty with stent placement      "I've had 7 or 8 stents put in my heart; last stents put in ~ 2 wk ago" (01/02/2013)  . Back surgery    . Anterior fusion cervical spine      "C5-7; took bone off my right hip" (5-01/2013)    Ian Malkin RD, LDN Inpatient Clinical Dietitian Pager: [phone] After Hours Pager: [phone]

## 2013-05-18 NOTE — H&P (Addendum)
PULMONARY  / CRITICAL CARE MEDICINE  Name: Eugene Bauer MRN: [id number] DOB: 16-Nov-1926    ADMISSION DATE:  05/18/2013 CONSULTATION DATE:  05/18/2013  REFERRING MD :  Dr. Rogelia Boga PRIMARY SERVICE: Medicine Teaching Service  CHIEF COMPLAINT:  Syncope  BRIEF PATIENT DESCRIPTION: 42 M with AS, CAD, CVA, Afib admitted for Cough/SOB 9/19 who developed hypotension, slurred speech, weakness after rising from sitting. Transferred to ICU for further monitoring.  SIGNIFICANT EVENTS / STUDIES:  1. Syncope 9/19  LINES / TUBES: 1. PIV  CULTURES: 1. None  ANTIBIOTICS: 1. None  HISTORY OF PRESENT ILLNESS:  Eugene Bauer is an 74 M with multiple medical issues some of the highlights of which include mild to moderate AS, Afib on AC, CAD, COPD, CVA who presented to [facility] today for cough and shortness of breath and subsequently developed an acute episode of hypotension, slurred speech, and facial droop. The shortness of breath and cough worsened from his baseline approximately 10 days ago. His cough which is usually non-productive became productive of yellowish sputum. Today, he was rising to transfer and became weak. The rapid response RN reports that a facial droop was noted and he may have been incontinent of urine. He was noted to be hypotensive. Currently, his only complaint is mild nausea.  PAST MEDICAL HISTORY :  Past Medical History  Diagnosis Date  . Aortic stenosis   . High cholesterol   . COPD (chronic obstructive pulmonary disease)   . Retinal artery occlusion   . Emphysema   . GERD (gastroesophageal reflux disease)   . Coronary artery disease   . Heart murmur   . On home oxygen therapy     "2L all the time" (01/02/2013)  . Angina   . Hypertension   . CHF (congestive heart failure)   . MI (myocardial infarction)     "I've had several; last one was 03/09/2012" (01/02/2013  . Asthma   . Pneumonia     "today; have had it at least twice before" (01/02/2013)  . Shortness of breath     "most  of the time recently" (01/02/2013)  . Chronic bronchitis   . Type II diabetes mellitus   . History of stomach ulcers     "have them now; they flare up when I eat the wrong thing or don't eat" (01/02/2013)  . Hepatitis     "don't know which one" (01/02/2013)  . [id number](478.2)     "probably weekly" (01/02/2013)  . Stroke     "several; left ankle/foot still weak from one of them" (01/02/2013)  . Arthritis     "all over" (01/02/2013)  . DJD (degenerative joint disease)     "all over" (01/02/2013)  . Chronic lower back pain   . Urinary urgency   . Prostate cancer     "had radiation treatments" (01/02/2013)  . Basal cell carcinoma     "left nose, behind right knee" (01/02/2013)  . Anemia     chronic/notes 01/02/2013    Past Surgical History  Procedure Laterality Date  . Shoulder arthroscopy w/ rotator cuff repair Right 1990's; 2000's    "they've operated on it twice" (01/02/2013)  . Sp pta peripheral      Bilateral leg stents  . Inguinal hernia repair Bilateral     "1 wk apart" (01/02/2013)  . Coronary stent placement      "I've had 7 or 8 stents put in my heart" (01/02/2013)  . Cardiac catheterization      "couldn't get new type of  stents put in" (01/02/2013)  . Coronary angioplasty with stent placement      "I've had 7 or 8 stents put in my heart; last stents put in ~ 2 wk ago" (01/02/2013)  . Back surgery    . Anterior fusion cervical spine      "C5-7; took bone off my right hip" (5-01/2013)    Prior to Admission medications   Medication Sig Start Date End Date Taking? Authorizing Provider  acetaminophen (TYLENOL) 500 MG tablet Take 500 mg by mouth every 6 (six) hours as needed for pain.   Yes Historical Provider, MD  albuterol (PROVENTIL HFA;VENTOLIN HFA) 108 (90 BASE) MCG/ACT inhaler Inhale 2 puffs into the lungs every 6 (six) hours as needed for wheezing.   Yes Historical Provider, MD  aspirin 81 MG tablet Take 81 mg by mouth daily.     Yes Historical Provider, MD  atorvastatin (LIPITOR) 80 MG  tablet Take 80 mg by mouth at bedtime.   Yes Historical Provider, MD  ferrous sulfate 325 (65 FE) MG tablet Take 650 mg by mouth every evening.   Yes Historical Provider, MD  gabapentin (NEURONTIN) 300 MG capsule Take 300 mg by mouth at bedtime.     Yes Historical Provider, MD  isosorbide dinitrate (ISORDIL) 30 MG tablet Take 30 mg by mouth 3 (three) times daily.   Yes Historical Provider, MD  losartan (COZAAR) 25 MG tablet Take 25 mg by mouth every evening.   Yes Historical Provider, MD  metFORMIN (GLUCOPHAGE) 500 MG tablet Take 500 mg by mouth 2 (two) times daily with a meal.   Yes Historical Provider, MD  metoprolol (LOPRESSOR) 50 MG tablet Take 25 mg by mouth 2 (two) times daily.   Yes Historical Provider, MD  mometasone [facility]) 220 MCG/INH inhaler Inhale 2 puffs into the lungs at bedtime.    Yes Historical Provider, MD  nitroGLYCERIN (NITROSTAT) 0.4 MG SL tablet Place 0.4 mg under the tongue every 5 (five) minutes as needed for chest pain.   Yes Historical Provider, MD  omeprazole (PRILOSEC) 40 MG capsule Take 1 capsule (40 mg total) by mouth 2 (two) times daily before a meal. 01/05/13  Yes Shanker Levora Dredge, MD  polyvinyl alcohol (LIQUIFILM TEARS) 1.4 % ophthalmic solution Place 1 drop into both eyes as needed. 01/05/13  Yes Shanker Levora Dredge, MD  ranolazine (RANEXA) 500 MG 12 hr tablet Take 500 mg by mouth 2 (two) times daily.     Yes Historical Provider, MD  Rivaroxaban (XARELTO) 20 MG TABS Take 1 tablet (20 mg total) by mouth daily with supper. First dose on Thu 01/25/13 01/25/13  Yes Shanker Levora Dredge, MD  tamsulosin (FLOMAX) 0.4 MG CAPS capsule Take 0.4 mg by mouth daily after supper.   Yes Historical Provider, MD  Ticagrelor (BRILINTA) 90 MG TABS tablet Take 90 mg by mouth 2 (two) times daily.   Yes Historical Provider, MD  Travoprost, BAK Free, (TRAVATAN) 0.004 % SOLN ophthalmic solution Place 1 drop into both eyes at bedtime.     Yes Historical Provider, MD    Allergies  Allergen  Reactions  . Iodine Shortness Of Breath  . Iohexol      Code: SOB, Desc: ivp dye, iodine, Onset Date: 14782956     FAMILY HISTORY:  Family History  Problem Relation Age of Onset  . Coronary artery disease Brother 79  . Coronary artery disease Sister     SOCIAL HISTORY:  reports that he has never smoked. He has quit using  smokeless tobacco. His smokeless tobacco use included Chew. He reports that he does not drink alcohol or use illicit drugs.  REVIEW OF SYSTEMS:  A 12 system ROS was conducted and, unless otherwise specified in the HPI, was negative.  PHYSICAL EXAM  VITAL SIGNS: Temp:  [97.7 F (36.5 C)-99.3 F (37.4 C)] 98.4 F (36.9 C) (09/19 1509) Pulse Rate:  [74-130] 130 (09/19 1900) Resp:  [16-24] 24 (09/19 1900) BP: (60-178)/(48-99) 173/60 mmHg (09/19 1900) SpO2:  [93 %-100 %] 99 % (09/19 1900) Weight:  [170 lb (77.111 kg)] 170 lb (77.111 kg) (09/19 1022)  HEMODYNAMICS:    VENTILATOR SETTINGS:    INTAKE / OUTPUT: Intake/Output     09/19 0701 - 09/20 0700   I.V. (mL/kg) 100 (1.3)   Total Intake(mL/kg) 100 (1.3)   Urine (mL/kg/hr) 150   Total Output 150   Net -50       Urine Occurrence 2 x     PHYSICAL EXAMINATION: General:  Elderly M in NAD Neuro:  A+O x 3 but occassionally confused with details (kept saying that he was rising to eat breakfast when sycopized). Strength/sensation grossly intact. No facial droop on exam. HEENT:  Sclera anicteric, conjunctiva pale Neck:  (-) JVD, LAN. Trachea supple and midline. Cardiovascular:  RRR, NS1/S2, (-) RG. 3+ SEM heard loudest in aortic region Lungs:  Decreased BS as bases Abdomen:  S/NT/ND/(+)BS Musculoskeletal:  (-) C/C/E Skin:  Grossly intact  LABS:  CBC Recent Labs     05/17/13  1945  05/17/13  2055  05/18/13  1127  WBC  6.2   --   6.6  HGB  11.5*  12.6*  11.8*  HCT  31.9*  37.0*  33.4*  PLT  190   --   191    Coag's No results found for this basename: APTT, INR,  in the last 72  hours  BMET Recent Labs     05/17/13  2055  05/18/13  1127  NA  127*  126*  K  4.2  4.4  CL  90*  87*  CO2   --   29  BUN  15  18  CREATININE  1.00  1.14  GLUCOSE  109*  181*    Electrolytes Recent Labs     05/18/13  1127  CALCIUM  10.2    Sepsis Markers No results found for this basename: LACTICACIDVEN, PROCALCITON, O2SATVEN,  in the last 72 hours  ABG No results found for this basename: PHART, PCO2ART, PO2ART,  in the last 72 hours  Liver Enzymes Recent Labs     05/18/13  1127  AST  16  ALT  17  ALKPHOS  92  BILITOT  0.3  ALBUMIN  4.1    Cardiac Enzymes Recent Labs     05/18/13  1128  05/18/13  1545  TROPONINI   --   <0.30  PROBNP  710.7*   --     Glucose Recent Labs     05/18/13  1550  05/18/13  1738  GLUCAP  154*  188*    Imaging Dg Chest 2 View (if Patient Has Fever And/or Copd)  05/18/2013   CLINICAL DATA:  Shortness of Breath. Fatigue.  EXAM: CHEST  2 VIEW  COMPARISON:  05/17/2013, 01/02/2013 and 06/15/2012.  FINDINGS: Central pulmonary vascular prominence stable.  Calcified aorta.  Heart size top normal with coronary artery calcifications noted.  No infiltrate, congestive heart failure or pneumothorax.  No plain film evidence of pulmonary malignancy.  IMPRESSION:  No acute abnormality. Please see above.   Electronically Signed   By: Bridgett Larsson   On: 05/18/2013 11:06   Dg Chest 2 View  05/17/2013   CLINICAL DATA:  Coughing and history of pneumonia.  EXAM: CHEST  2 VIEW  COMPARISON:  01/02/2013  FINDINGS: Two views of the chest demonstrate clear lungs. There is no focal airspace disease and no evidence for pulmonary edema. Bony thorax is intact. Heart and mediastinum are within normal limits.  IMPRESSION: No acute cardiopulmonary disease.   Electronically Signed   By: Richarda Overlie M.D.   On: 05/17/2013 20:42    EKG: Peri-event EKG was personally reviewed. Poor RW progression. (-) Acute ST changes. CXR: Chest X-ray from today was personally  reviewed by me. There is a vague LLL opacity which has been present since 2013 and is likely scarring. There is no acute abnormality.  ASSESSMENT / PLAN: Active Problems:   CAD (coronary artery disease)   COPD (chronic obstructive pulmonary disease)   Hyponatremia   Anemia   Diabetes mellitus   COPD exacerbation   Syncope and collapse   Chronic respiratory failure   PULMONARY A: 1. COPD exacerbation: This is the most likely explanation for MR. Heard's symptoms. I do not see evidence of acute heart failure on exam which could be 2/2 diuresis. There is no evidence of pneumonia. PE is a reasonable consideration as well. 2. Chronic Hypoxic Respiratory Failure: Mr. Petron is at his baseline O2 reqirement.  P:    Levaquin, Prednisone 40 x 5 days, Standing Nebs  LE Dopplers  Cont O2 supplementation  CARDIOVASCULAR A:  1. Syncope: Suspect vasovagal or orthostatic given history. Orthostatis could have been caused by diuresis or anti-hypertensive medications. Cardiogenic/neurogenic possible as well given patient medical history.   P:   Tele monitoring, Serial Enzymes  Last echo 2012, could consider repeating  Gentle IVF  Monitor BPs, adjust PRN  RENAL A:  1. Hyponatremia: This is most likely 2/2 overdiuresis or inadequate fluid intake based on exam.  P:    Gentle IVF; if no resolution will pursue further evaluation  GASTROINTESTINAL A: 1. No acute issues  HEMATOLOGIC A:    No acute issues  INFECTIOUS A:  No acute issues  ENDOCRINE A:   1. DM  P:    Hold metformin, start SSI  NEUROLOGIC A:  1. Possible CVA: I doubt that Mr. Batson presentation is 2/2 an acute CVA given my exam, however, I did discuss his case with the neurologist on call. We will obtain and MRI which - if it shows an acute CVA - will lead to a neuro consult.  P:    MRI  Consider Neuro Consult  BEST PRACTICE / DISPOSITION Level of Care:  ICU Consultants:  Neuro  (possibly) Code Status:  DNR Diet:  Regular DVT Px:  SQH GI Px:  Not indicated Skin Integrity:  Intact Social / Family:  Updated at bedside  TODAY'S SUMMARY:   I have personally obtained a history, examined the patient, evaluated laboratory and imaging results, formulated the assessment and plan and placed orders.  CRITICAL CARE: The patient is critically ill with multiple organ systems failure and requires high complexity decision making for assessment and support, frequent evaluation and titration of therapies, application of advanced monitoring technologies and extensive interpretation of multiple databases. Critical Care Time devoted to patient care services described in this note is 45 minutes.   Evalyn Casco, MD Pulmonary and Critical Care Medicine [facility] Pager: [phone])  [phone]  05/18/2013, 7:58 PM

## 2013-05-18 NOTE — ED Notes (Signed)
Pt's daughter given phone number for RN's portable phone so she can check on her dad and find out which room he will go to upstairs.

## 2013-05-18 NOTE — Significant Event (Signed)
Rapid Response Event Note  Overview:  Called to assist with patient with sudden change in LOC and decreased BP  Time Called: 1736 Arrival Time: 1742 Event Type: Hypotension  Initial Focused Assessment:  On arrival patient supine in bed - alert - speech slightly slurred - cold clammy - no neuro focal signs noted - HR 115 on monitor - ST - pale - patient states he feels bad - pressure on chest like something sitting there - No JVD noted - bil BS equal - c/o some SOB - answers questions appropriately.   RR 24.  O2 sats 97%.   CBG 138.  Manual BP left arm 50/27.  Interventions: NS bolus started at wide open.  Placed on 2 liter n/c.   Stat 12 lead EKG done - no acute changes noted.  Dr. Sherrine Maples at bedside.  BP 55/30.  HR 108.   Dr. Sherrine Maples speaking with Dr. Delford Field.  Bp now 85/42.  HR 110.  RR 20 O2 sat 97%.  Patient still c/o some discomfort in chest.  After 500 cc fluid BP 108/57.  Patient states he feels much better.  Color better.  Skin still a little clammy.  HOB up some - little bit of cough - bil BS remain same - few fine crackles at bases but mostly clear.  Handoff to [employer].  Staff to transfer to 2H04.  Dr. Sherrine Maples remains with patient.     Event Summary: Name of Physician Notified: Dr. Sherrine Maples at [phone]  Name of Consulting Physician Notified: Dr. Delford Field at 1750  Outcome: Transferred (Comment) [phone])  Event End Time: 1815  [place]

## 2013-05-19 ENCOUNTER — Other Ambulatory Visit: Payer: Self-pay

## 2013-05-19 DIAGNOSIS — R55 Syncope and collapse: Secondary | ICD-10-CM

## 2013-05-19 DIAGNOSIS — I509 Heart failure, unspecified: Secondary | ICD-10-CM

## 2013-05-19 LAB — BASIC METABOLIC PANEL
BUN: 16 mg/dL (ref 6–23)
Calcium: 9.4 mg/dL (ref 8.4–10.5)
Chloride: 92 mEq/L — ABNORMAL LOW (ref 96–112)
Creatinine, Ser: 1.16 mg/dL (ref 0.50–1.35)
GFR calc Af Amer: 65 mL/min — ABNORMAL LOW (ref >90)
Glucose, Bld: 178 mg/dL — ABNORMAL HIGH (ref 70–99)
Potassium: 4.3 mEq/L (ref 3.5–5.1)
Sodium: 128 mEq/L — ABNORMAL LOW (ref 135–145)

## 2013-05-19 LAB — GLUCOSE, CAPILLARY
Glucose-Capillary: 173 mg/dL — ABNORMAL HIGH (ref 70–99)
Glucose-Capillary: 224 mg/dL — ABNORMAL HIGH (ref 70–99)
Glucose-Capillary: 216 mg/dL — ABNORMAL HIGH (ref 70–99)

## 2013-05-19 LAB — TROPONIN I: Troponin I: <0.3 ng/mL (ref <0.30)

## 2013-05-19 MED ORDER — ALBUTEROL SULFATE HFA 108 (90 BASE) MCG/ACT IN AERS: 2.0000 | INHALATION_SPRAY | RESPIRATORY_TRACT | Status: DC | PRN

## 2013-05-19 MED ORDER — ALBUTEROL SULFATE (5 MG/ML) 0.5% IN NEBU
2.5000 mg | INHALATION_SOLUTION | Freq: Four times a day (QID) | RESPIRATORY_TRACT | Status: DC
  Administered 2013-05-19 – 2013-05-20 (×7): 2.5 mg via RESPIRATORY_TRACT
  Filled 2013-05-19 (×6): qty 0.5

## 2013-05-19 MED ORDER — SODIUM CHLORIDE 0.9 % IV BOLUS (SEPSIS)
250.0000 mL | Freq: Once | INTRAVENOUS | Status: AC
  Administered 2013-05-19: 250 mL via INTRAVENOUS

## 2013-05-19 NOTE — H&P (Signed)
PULMONARY  / CRITICAL CARE MEDICINE  Name: Mann Skaggs MRN: [id number] DOB: Jun 30, 1927    ADMISSION DATE:  05/18/2013 CONSULTATION DATE:  05/18/2013  REFERRING MD :  Dr. Rogelia Boga PRIMARY SERVICE: Medicine Teaching Service  CHIEF COMPLAINT:  Syncope  BRIEF PATIENT DESCRIPTION: 74 M with AS, CAD, CVA, Afib admitted for Cough/SOB 9/19 who developed hypotension, slurred speech, weakness after rising from sitting. Transferred to ICU for further monitoring.  SIGNIFICANT EVENTS / STUDIES:  9/19 - Admit with cough & SOB, developed hypotension & slurred speech.  MRI neg for acute infarct.  9/20 - LE Doppler>>>  LINES / TUBES: PIV  CULTURES:  ANTIBIOTICS:   VITAL SIGNS: Temp:  [97.8 F (36.6 C)-99.3 F (37.4 C)] 98.4 F (36.9 C) (09/20 0400) Pulse Rate:  [83-204] 95 (09/20 0700) Resp:  [12-35] 12 (09/20 0700) BP: (60-178)/(22-99) 132/53 mmHg (09/20 0700) SpO2:  [92 %-100 %] 97 % (09/20 0700) Weight:  [156 lb 4.9 oz (70.9 kg)-170 lb (77.111 kg)] 156 lb 4.9 oz (70.9 kg) (09/20 0500)  INTAKE / OUTPUT: Intake/Output     09/19 0701 - 09/20 0700 09/20 0701 - 09/21 0700   P.O. 20    I.V. (mL/kg) 650 (9.2)    IV Piggyback 250    Total Intake(mL/kg) 920 (13)    Urine (mL/kg/hr) 570    Total Output 570     Net +350          Urine Occurrence 2 x      PHYSICAL EXAMINATION: General:  Elderly M in NAD Neuro:  A+O x 3 but occassionally confused with details (kept saying that he was rising to eat breakfast when sycopized). Strength/sensation grossly intact. No facial droop on exam. HEENT:  Sclera anicteric, conjunctiva pale Neck:  (-) JVD, LAN. Trachea supple and midline. Cardiovascular:  RRR, NS1/S2, (-) RG. 3+ SEM heard loudest in aortic region Lungs:  Decreased BS as bases Abdomen:  S/NT/ND/(+)BS Musculoskeletal:  (-) C/C/E Skin:  Grossly intact  LABS:  CBC Recent Labs     05/17/13  1945  05/17/13  2055  05/18/13  1127  WBC  6.2   --   6.6  HGB  11.5*  12.6*  11.8*   HCT  31.9*  37.0*  33.4*  PLT  190   --   191   BMET Recent Labs     05/17/13  2055  05/18/13  1127  NA  127*  126*  K  4.2  4.4  CL  90*  87*  CO2   --   29  BUN  15  18  CREATININE  1.00  1.14  GLUCOSE  109*  181*   Liver Enzymes Recent Labs     05/18/13  1127  AST  16  ALT  17  ALKPHOS  92  BILITOT  0.3  ALBUMIN  4.1   Cardiac Enzymes Recent Labs     05/18/13  1128  05/18/13  1545  05/18/13  2219  TROPONINI   --   <0.30  <0.30  PROBNP  710.7*   --    --    Glucose Recent Labs     05/18/13  1550  05/18/13  1738  05/18/13  2256  GLUCAP  154*  188*  163*   Imaging Dg Chest 2 View (if Patient Has Fever And/or Copd)  05/18/2013   CLINICAL DATA:  Shortness of Breath. Fatigue.  EXAM: CHEST  2 VIEW  COMPARISON:  05/17/2013, 01/02/2013 and 06/15/2012.  FINDINGS: Central  pulmonary vascular prominence stable.  Calcified aorta.  Heart size top normal with coronary artery calcifications noted.  No infiltrate, congestive heart failure or pneumothorax.  No plain film evidence of pulmonary malignancy.  IMPRESSION: No acute abnormality. Please see above.   Electronically Signed   By: Bridgett Larsson   On: 05/18/2013 11:06   Dg Chest 2 View  05/17/2013   CLINICAL DATA:  Coughing and history of pneumonia.  EXAM: CHEST  2 VIEW  COMPARISON:  01/02/2013  FINDINGS: Two views of the chest demonstrate clear lungs. There is no focal airspace disease and no evidence for pulmonary edema. Bony thorax is intact. Heart and mediastinum are within normal limits.  IMPRESSION: No acute cardiopulmonary disease.   Electronically Signed   By: Richarda Overlie M.D.   On: 05/17/2013 20:42   Mr Laqueta Jean ZO Contrast  05/18/2013   *RADIOLOGY REPORT*  Clinical Data: Evaluate for stroke  MRI HEAD WITHOUT AND WITH CONTRAST  Technique:  Multiplanar, multiecho pulse sequences of the brain and surrounding structures were obtained according to standard protocol without and with intravenous contrast  Contrast: 15mL  MULTIHANCE GADOBENATE DIMEGLUMINE 529 MG/ML IV SOLN  Comparison: Prior CT from 09/29/2010.  Findings: Diffuse generalized atrophy is present.  Scattered T2 / FLAIR hyperintense foci within the periventricular deep white matter are most consistent with chronic microvascular ischemic disease. FLAIR signal with encephalomalacia within the high right frontal lobe near the vertex is consistent with remote infarct in this region.  No diffusion weighted signal abnormalities identified to suggest acute intracranial infarct.  There is no acute intracranial hemorrhage.  No midline shift or mass lesion.  No hydrocephalus.  There is no extra-axial fluid collection.  Pituitary gland is normal.  Pituitary stalk is midline.  Optic nerves and optic chiasm are normal.  Craniocervical junction is within normal limits.  Corpus callosum is intact.  Calvarium and scalp soft tissues are normal.  No abnormal enhancement is seen within the brain on post contrast sequences.  There is mucosal enhancement with air fluid level within the right sphenoid sinus. Right mastoid effusion is present.  IMPRESSION: 1. No acute intracranial infarct. 2. Encephalomalacia with hyperintense T2/FLAIR signal within the high right frontal lobe near the vertex, consistent with remote ischemic infarct. 3.  Atrophy with microvascular ischemic changes. 4.  Right mastoid effusion. 5.  Right sphenoid sinus disease.   Original Report Authenticated By: Rise Mu, M.D.    EKG: Peri-event EKG was personally reviewed. Poor RW progression. (-) Acute ST changes. CXR: 9/19 - vague LLL opacity which has been present since 2013 and is likely scarring. There is no acute abnormality.  ASSESSMENT / PLAN: Active Problems:   CAD (coronary artery disease)   COPD (chronic obstructive pulmonary disease)   Hyponatremia   Anemia   Diabetes mellitus   COPD exacerbation   Syncope and collapse   Chronic respiratory failure   PULMONARY A: COPD exacerbation -  This is the most likely explanation for MR. Texeira's symptoms. I do not see evidence of acute heart failure on exam which could be 2/2 diuresis. There is no evidence of pneumonia. PE is a reasonable consideration as well. Chronic Hypoxic Respiratory Failure - Mr. Shehadeh is at his baseline O2 reqirement.  P:   Levaquin Nebs Prednisone Taper LE Dopplers Cont O2 supplementation  CARDIOVASCULAR A:  Syncope - Suspect vasovagal or orthostatic given history. Orthostatis could have been caused by diuresis or anti-hypertensive medications. Cardiogenic/neurogenic possible as well given patient medical history.  Afib  P:  Tele monitoring, Serial Enzymes Last echo 2012, consider repeating Gentle IVF Monitor BPs Continue xarelto for fib  RENAL A:  Hyponatremia -most likely 2/2 overdiuresis or inadequate fluid intake based on exam.  P:   Gentle IVF; if no resolution will pursue further evaluation  GASTROINTESTINAL A: No acute issues P:  Heart Healthy diet  HEMATOLOGIC A:   No acute issues  INFECTIOUS A: No acute issues, UA negative.   ENDOCRINE A:   DM  P:   Hold metformin SSI  NEUROLOGIC A:  AMS - MRI negative for acute infarct.  Noted age related changes and RFL remote infarct.   P:   -supportive care -teaching regarding orthostatic hypotension    IMTS to pick back up in am 9/21.  Thank you for the consultation.     Canary Brim, NP-C [facility] Pgr: [phone] or [phone]    I have personally obtained a history, examined the patient, evaluated laboratory and imaging results, formulated the assessment and plan and placed orders.  CRITICAL CARE: The patient is critically ill with multiple organ systems failure and requires high complexity decision making for assessment and support, frequent evaluation and titration of therapies, application of advanced monitoring technologies and extensive interpretation of multiple databases. Critical  Care Time devoted to patient care services described in this note is __minutes.     05/19/2013, 7:47 AM    I have interviewed and examined the patient and reviewed the database. I have formulated the assessment and plan as reflected in the note above with amendments made by me.   Billy Fischer, MD;  PCCM service; Mobile [phone]

## 2013-05-19 NOTE — H&P (Signed)
  Date: 05/19/2013  Patient name: Eugene Bauer  Medical record number: [id number]  Date of birth: 01-20-1927   I have seen and evaluated Eugene Bauer and discussed their care with the Residency Team. Pt was admitted for acute resp failure and was tx with IV lasix in the ED. He then developed acute hypotension with ? facial droop and was transferred to the ICU team. He was gently hydrated and is now stable and being transferred to the step down unit.   Assessment and Plan: I have seen and evaluated the patient as outlined above. I agree with the formulated Assessment and Plan as detailed in the residents' admission note, with the following changes:   1. Acute resp failure - We are neither hydrating him (has slight pitting edema in his back - the dependent body part) nor diuresing him (not on diuretics as an outpt). Pt states resp status is at baseline. Will cont nebs. He was also started in levaquin and prednisone for presumed COPD exac.   2. Elevated Trop I with nl EKG - likely stress induced 2/2 the hypotension episode. Trend. He is on ASA. BB, Imdur, Cozaar, and Ranexa are being held 2/2 hypotension.   3. Syncope - occurred during the acute episode last PM. MRI negative for CVA. Has known CAD and following Trop I. No indication for ECHO at this time.   Burns Spain, MD 9/20/20142:33 PM

## 2013-05-19 NOTE — Progress Notes (Signed)
Subjective: Patient feels much better. No complaints today. Cough and SOB much improved compared to yesterday. Had breakfast today, tolerated diet. No complaints of chest pain.  Objective: Vital signs in last 24 hours: Filed Vitals:   05/19/13 0911 05/19/13 1000 05/19/13 1200 05/19/13 1230  BP:  132/33  133/49  Pulse:  102 95 105  Temp:   98.5 F (36.9 C)   TempSrc:   Oral   Resp:  15  18  Height:      Weight:      SpO2: 100% 98%  96%   Weight change:   Intake/Output Summary (Last 24 hours) at 05/19/13 1357 Last data filed at 05/19/13 1300  Gross per 24 hour  Intake   1650 ml  Output    970 ml  Net    680 ml   Exam, Complete:[id number]  GENERAL- alert, co-operative, appears as stated age, on o23  HEENT- Atraumatic, normocephalic, PERRL, EOMI, oral mucosa appears moist, good and intact dentition. No carotid bruit, no cervical LN enlargement, thyroid does not appear enlarged.  CARDIAC- Regular, Systolic murmur heard in aortic and mitral area, rubs or gallops.  RESP- Moving equal volumes of air, no wheezes or crackles present on exam. ABDOMEN- Soft, non tender, no palpable masses or organomegaly, bowel sounds present.  BACK- Normal curvature of the spine, 4cm scarring present to the Rt of the spine- previous surg, No tenderness along the vertebrae, no CVA tenderness.  NEURO- Cr N 2-12 intact, strenght equal and present in all extremities.  Extrem- mild Pedal edema, up to ankles, mostly resolved.  SKIN- Warm, dry, No rash or lesion.  PSYCH- Normal mood and affect, appropriate thought content and speech  Lab Results: Basic Metabolic Panel:  Recent Labs Lab 05/18/13 1127 05/19/13 0630  NA 126* 128*  K 4.4 4.3  CL 87* 92*  CO2 29 27  GLUCOSE 181* 178*  BUN 18 16  CREATININE 1.14 1.16  CALCIUM 10.2 9.4   Liver Function Tests:  Recent Labs Lab 05/18/13 1127  AST 16  ALT 17  ALKPHOS 92  BILITOT 0.3  PROT 7.2  ALBUMIN 4.1   CBC:  Recent Labs Lab 05/17/13 1945  05/17/13 2055 05/18/13 1127  WBC 6.2  --  6.6  NEUTROABS 4.0  --   --   HGB 11.5* 12.6* 11.8*  HCT 31.9* 37.0* 33.4*  MCV 80.6  --  81.3  PLT 190  --  191   Cardiac Enzymes:  Recent Labs Lab 05/18/13 2219 05/19/13 0630 05/19/13 1215  TROPONINI <0.30 0.33* 0.31*   BNP:  Recent Labs Lab 05/18/13 1128  PROBNP 710.7*   CBG:  Recent Labs Lab 05/18/13 1550 05/18/13 1738 05/18/13 2256 05/19/13 0741 05/19/13 1156  GLUCAP 154* 188* 163* 173* 224*   Urinalysis:  Recent Labs Lab 05/18/13 1345  COLORURINE YELLOW  LABSPEC 1.009  PHURINE 5.0  GLUCOSEU NEGATIVE  HGBUR NEGATIVE  BILIRUBINUR NEGATIVE  KETONESUR NEGATIVE  PROTEINUR NEGATIVE  UROBILINOGEN 0.2  NITRITE NEGATIVE  LEUKOCYTESUR NEGATIVE    Micro Results: Recent Results (from the past 240 hour(s))  MRSA PCR SCREENING     Status: None   Collection Time    05/18/13  6:50 PM      Result Value Range Status   MRSA by PCR NEGATIVE  NEGATIVE Final   Comment:            The GeneXpert MRSA Assay (FDA     approved for NASAL specimens     only),  is one component of a     comprehensive MRSA colonization     surveillance program. It is not     intended to diagnose MRSA     infection nor to guide or     monitor treatment for     MRSA infections.   Studies/Results: Dg Chest 2 View (if Patient Has Fever And/or Copd)  05/18/2013   CLINICAL DATA:  Shortness of Breath. Fatigue.  EXAM: CHEST  2 VIEW  COMPARISON:  05/17/2013, 01/02/2013 and 06/15/2012.  FINDINGS: Central pulmonary vascular prominence stable.  Calcified aorta.  Heart size top normal with coronary artery calcifications noted.  No infiltrate, congestive heart failure or pneumothorax.  No plain film evidence of pulmonary malignancy.  IMPRESSION: No acute abnormality. Please see above.   Electronically Signed   By: Bridgett Larsson   On: 05/18/2013 11:06   Dg Chest 2 View  05/17/2013   CLINICAL DATA:  Coughing and history of pneumonia.  EXAM: CHEST  2 VIEW   COMPARISON:  01/02/2013  FINDINGS: Two views of the chest demonstrate clear lungs. There is no focal airspace disease and no evidence for pulmonary edema. Bony thorax is intact. Heart and mediastinum are within normal limits.  IMPRESSION: No acute cardiopulmonary disease.   Electronically Signed   By: Richarda Overlie M.D.   On: 05/17/2013 20:42   Mr Laqueta Jean JX Contrast  05/18/2013   *RADIOLOGY REPORT*  Clinical Data: Evaluate for stroke  MRI HEAD WITHOUT AND WITH CONTRAST  Technique:  Multiplanar, multiecho pulse sequences of the brain and surrounding structures were obtained according to standard protocol without and with intravenous contrast  Contrast: 15mL MULTIHANCE GADOBENATE DIMEGLUMINE 529 MG/ML IV SOLN  Comparison: Prior CT from 09/29/2010.  Findings: Diffuse generalized atrophy is present.  Scattered T2 / FLAIR hyperintense foci within the periventricular deep white matter are most consistent with chronic microvascular ischemic disease. FLAIR signal with encephalomalacia within the high right frontal lobe near the vertex is consistent with remote infarct in this region.  No diffusion weighted signal abnormalities identified to suggest acute intracranial infarct.  There is no acute intracranial hemorrhage.  No midline shift or mass lesion.  No hydrocephalus.  There is no extra-axial fluid collection.  Pituitary gland is normal.  Pituitary stalk is midline.  Optic nerves and optic chiasm are normal.  Craniocervical junction is within normal limits.  Corpus callosum is intact.  Calvarium and scalp soft tissues are normal.  No abnormal enhancement is seen within the brain on post contrast sequences.  There is mucosal enhancement with air fluid level within the right sphenoid sinus. Right mastoid effusion is present.  IMPRESSION: 1. No acute intracranial infarct. 2. Encephalomalacia with hyperintense T2/FLAIR signal within the high right frontal lobe near the vertex, consistent with remote ischemic infarct. 3.   Atrophy with microvascular ischemic changes. 4.  Right mastoid effusion. 5.  Right sphenoid sinus disease.   Original Report Authenticated By: Rise Mu, M.D.   Medications: I have reviewed the patient's current medications. Scheduled Meds: . albuterol  2.5 mg Nebulization Q6H  . aspirin EC  81 mg Oral Daily  . ferrous sulfate  650 mg Oral QPM  . fluticasone  2 puff Inhalation BID  . gabapentin  300 mg Oral QHS  . insulin aspart  0-5 Units Subcutaneous QHS  . insulin aspart  0-9 Units Subcutaneous TID WC  . ipratropium  0.5 mg Nebulization Q6H  . latanoprost  1 drop Both Eyes QHS  . levofloxacin  250 mg Oral Q2000  . pantoprazole  40 mg Oral Daily  . predniSONE  40 mg Oral Daily  . ranolazine  500 mg Oral BID  . Rivaroxaban  20 mg Oral Q supper  . Ticagrelor  90 mg Oral BID   Continuous Infusions:  PRN Meds:.sodium chloride, acetaminophen, albuterol, albuterol, feeding supplement, ipratropium, nitroGLYCERIN, ondansetron (ZOFRAN) IV, polyvinyl alcohol Assessment/Plan:  Diastolic CHF- Presentation with cough productive of clear sputum, with SOB, with previous hx of CHF, and presence of crackles on exam,. And pedal edema, is supportive of CHF exacerbation. But chest xray done today did not show feats of fluid overload. Patient home meds do not include frusemide. Patinet had an epiosde of hypotension yesterday, WIth BP- 60/50, was given IV n/s with prompt improvement. Prior to this patinet had gotten 1 dose of frusemide in the Ed, and gotten his antihtn med- [place] and Isosabide  prior. Patient was transferred to CCU. Exacerbating factors- ACS/MI, pt has hx of CAD, though no complaints of chest pain, EKG done, not concerning, troponins done 6am- 0.33, 12.15 pm- 0.31, this afternoon, likely due to demand ischemia from hypotensive episode pt had yesterday. - Transfer back from CCU to Step down. - D/c Frusemide- 40mg  IV daily.  - Troponins X1.  - Hold home meds for now, till  blood pressures remain persistently stable - Lorsatan, metoprolol, Ranolaxine for CAD, NTG, Atorvastatin. - IV n/s discont.  Hyponatremia- Pts Na has been chronically reduced. On admission- Na- 126, was asymptomatic. Currently-05/29/2013- 128. Pts baseline over the past 4 mths- 129.  - Patient taking orally- regular diet. - Repeat BMP tomorrow morning.  Syncope- Likely due to hypotension from frusemide and antihypertensives, causing hypotension. Unlikely  Acute MI, EKG, done at the time showed no acute changes, and troponins- mildly elevated this morning. - Will check one more troponin.   COPD and Chronic Resp failure- COPD exacebation is a likley cause of SOB with cough, but no signif wheeezing present on exam Patient is a known smoker, uses O2 at home- 2L, says he has had SOB for up to 20yrs. Well controlled on inhalers.   - Cont Albuterol and ipratropium Nebulizers- Q4H PRN.   DM- Home meds- 500mg  Metformin BID. CBG this morning 224. HBA1c- 07/29/2011- 6.9.  - [facility] - Will continue metformin on admission.   CODE- DNR.  DVT PPX- On rivaroxaban.  Dispo: Disposition is deferred at this time, awaiting improvement of current medical problems.  Anticipated discharge in approximately 2-3 day(s).   The patient does have a current PCP [facility], MD) and does need an [facility] hospital follow-up appointment after discharge.  The patient does not know have transportation limitations that hinder transportation to clinic appointments.  .Services Needed at time of discharge: Y = Yes, Blank = No PT:   OT:   RN:   Equipment:   Other:     LOS: 1 day   Kennis Carina, MD 05/19/2013, 1:57 PM

## 2013-05-19 NOTE — Progress Notes (Signed)
Pt hypotensive 81/36 (49). Per Dr. Darrick Penna 250cc NS bolus. Will continue to monitor closely.

## 2013-05-19 NOTE — Progress Notes (Addendum)
Bilateral lower extremity venous duplex completed.  Right:  DVT noted in the right posterior tibial vein.  No evidence of superficial thrombosis.  No Baker's cyst.  Left:  No evidence of DVT, superficial thrombosis, or Baker's cyst.

## 2013-05-19 NOTE — Progress Notes (Signed)
CRITICAL VALUE ALERT  Critical value received:  Troponin 0.33  Date of notification:  05/19/13  Time of notification: 0830  Critical value read back:yes  Nurse who received alert:  Melonie Florida  MD notified (1st page):  Gearldine Bienenstock, NP  Time of first page:  0830  MD notified (2nd page):  Time of second page:  Responding MD:  Gearldine Bienenstock, NP  Time MD responded: 0830

## 2013-05-20 ENCOUNTER — Inpatient Hospital Stay (HOSPITAL_COMMUNITY): Payer: Medicare Other

## 2013-05-20 DIAGNOSIS — I82409 Acute embolism and thrombosis of unspecified deep veins of unspecified lower extremity: Secondary | ICD-10-CM | POA: Diagnosis present

## 2013-05-20 LAB — GLUCOSE, CAPILLARY
Glucose-Capillary: 125 mg/dL — ABNORMAL HIGH (ref 70–99)
Glucose-Capillary: 188 mg/dL — ABNORMAL HIGH (ref 70–99)
Glucose-Capillary: 259 mg/dL — ABNORMAL HIGH (ref 70–99)
Glucose-Capillary: 219 mg/dL — ABNORMAL HIGH (ref 70–99)

## 2013-05-20 LAB — BASIC METABOLIC PANEL
BUN: 13 mg/dL (ref 6–23)
CO2: 29 mEq/L (ref 19–32)
Calcium: 9.5 mg/dL (ref 8.4–10.5)
Chloride: 91 mEq/L — ABNORMAL LOW (ref 96–112)
Potassium: 3.5 mEq/L (ref 3.5–5.1)
Sodium: 128 mEq/L — ABNORMAL LOW (ref 135–145)

## 2013-05-20 LAB — CBC
HCT: 27.8 % — ABNORMAL LOW (ref 39.0–52.0)
MCH: 28.8 pg (ref 26.0–34.0)
MCV: 80.8 fL (ref 78.0–100.0)
RBC: 3.44 MIL/uL — ABNORMAL LOW (ref 4.22–5.81)
RDW: 14.2 % (ref 11.5–15.5)
WBC: 8.7 10*3/uL (ref 4.0–10.5)

## 2013-05-20 LAB — MAGNESIUM: Magnesium: 1.5 mg/dL (ref 1.5–2.5)

## 2013-05-20 MED ORDER — METOPROLOL TARTRATE 25 MG PO TABS
25.0000 mg | ORAL_TABLET | Freq: Two times a day (BID) | ORAL | Status: DC
  Administered 2013-05-20 – 2013-05-21 (×3): 25 mg via ORAL
  Filled 2013-05-20 (×4): qty 1

## 2013-05-20 NOTE — Evaluation (Signed)
Physical Therapy Evaluation Patient Details Name: Eugene Bauer MRN: [id number] DOB: [birth year] Today's Date: 05/20/2013 Time: 4098-1191 PT Time Calculation (min): 33 min  PT Assessment / Plan / Recommendation History of Present Illness  76 M with AS, CAD, CVA, Afib admitted for Cough/SOB 9/19 who developed hypotension, slurred speech, weakness after rising from sitting. Transferred to ICU for further monitoring  Clinical Impression  Pt admitted with the above. Pt currently with functional limitations due to the deficits listed below (see PT Problem List). Pt moving well and SaO2 maintained >93% on RA with ambulation.  Pt will benefit from skilled PT to increase their independence and safety with mobility to allow discharge to the venue listed below.     PT Assessment  Patient needs continued PT services    Follow Up Recommendations  Home health PT;Supervision - Intermittent    Equipment Recommendations  None recommended by PT    Frequency Min 3X/week    Precautions / Restrictions Precautions Precautions: Fall Restrictions Weight Bearing Restrictions: No   Pertinent Vitals/Pain No c/o pain; vitals maintained > 93% on RA with ambulation      Mobility  Bed Mobility Bed Mobility: Sit to Supine Sit to Supine: 5: Supervision;With rail Transfers Transfers: Sit to Stand;Stand to Sit Sit to Stand: 5: Supervision;From chair/3-in-1 Stand to Sit: 5: Supervision;To bed Details for Transfer Assistance: Supervision for safety Ambulation/Gait Ambulation/Gait Assistance: 4: Min guard;5: Supervision Ambulation Distance (Feet): 200 Feet Assistive device: Rolling walker Ambulation/Gait Assistance Details: Minguard for safety with turns  Gait Pattern: Step-through pattern;Shuffle;Trunk flexed Stairs: No    Exercises     PT Diagnosis: Generalized weakness  PT Problem List: Decreased strength;Decreased balance;Decreased mobility;Decreased knowledge of use of DME PT Treatment  Interventions: DME instruction;Gait training;Functional mobility training;Therapeutic activities;Therapeutic exercise;Balance training;Patient/family education     PT Goals(Current goals can be found in the care plan section) Acute Rehab PT Goals Patient Stated Goal: To return home PT Goal Formulation: With patient Time For Goal Achievement: 05/27/13 Potential to Achieve Goals: Good  Visit Information  Last PT Received On: 05/20/13 Assistance Needed: +1 History of Present Illness: 55 M with AS, CAD, CVA, Afib admitted for Cough/SOB 9/19 who developed hypotension, slurred speech, weakness after rising from sitting. Transferred to ICU for further monitoring       Prior Functioning  Home Living Family/patient expects to be discharged to:: Other (Comment) Additional Comments: pt lives at [facility].  Per pt they only provide A for meals and cleaning.   Prior Function Level of Independence: Needs assistance Gait / Transfers Assistance Needed: Ambulates with RW. Communication Communication: HOH    Cognition  Cognition Arousal/Alertness: Awake/alert    Extremity/Trunk Assessment Lower Extremity Assessment Lower Extremity Assessment: Generalized weakness   Balance    End of Session PT - End of Session Equipment Utilized During Treatment: Gait belt Activity Tolerance: Patient tolerated treatment well Patient left: in bed;with call bell/phone within reach Nurse Communication: Mobility status  GP     Evangeline Utley 05/20/2013, 4:45 PM  Jake Shark, PT DPT [phone]

## 2013-05-20 NOTE — Progress Notes (Signed)
Subjective: Patient feels much better. No complaints today. Cough and SOB much improved compared to yesterday. No complaints of chest pain.  Objective: Vital signs in last 24 hours: Filed Vitals:   05/20/13 0822 05/20/13 0830 05/20/13 0831 05/20/13 1122  BP: 154/70   141/61  Pulse: 73   80  Temp: 98.2 F (36.8 C)   98.2 F (36.8 C)  TempSrc: Oral   Oral  Resp: 13   14  Height:      Weight:      SpO2: 100% 98% 99% 98%   Weight change: -12 lb 13 oz (-5.811 kg)  Intake/Output Summary (Last 24 hours) at 05/20/13 1333 Last data filed at 05/20/13 1253  Gross per 24 hour  Intake      0 ml  Output   1000 ml  Net  -1000 ml   Physical Exam: Vitals reviewed. General: Sitting in chair eating lunch, NAD HEENT: PERRL, EOMI, no scleral icterus Cardiac: RRR, no rubs, murmurs or gallops Pulm: Diminished breath sounds in right base. Clear to auscultation bilaterally, mild end-expiratory wheeze heard in lower left lung Abd: Soft, nontender, nondistended, BS present Ext: Warm and well perfused, trace pitting edema of BLE Neuro: Alert and oriented X3, cranial nerves II-XII grossly intact, non-focal   Lab Results: Basic Metabolic Panel:  Recent Labs Lab 05/19/13 0630 05/20/13 0412  NA 128* 128*  K 4.3 3.5  CL 92* 91*  CO2 27 29  GLUCOSE 178* 162*  BUN 16 13  CREATININE 1.16 0.98  CALCIUM 9.4 9.5  MG  --  1.5   Liver Function Tests:  Recent Labs Lab 05/18/13 1127  AST 16  ALT 17  ALKPHOS 92  BILITOT 0.3  PROT 7.2  ALBUMIN 4.1   CBC:  Recent Labs Lab 05/17/13 1945  05/18/13 1127 05/20/13 0412  WBC 6.2  --  6.6 8.7  NEUTROABS 4.0  --   --   --   HGB 11.5*  < > 11.8* 9.9*  HCT 31.9*  < > 33.4* 27.8*  MCV 80.6  --  81.3 80.8  PLT 190  --  191 166  < > = values in this interval not displayed. Cardiac Enzymes:  Recent Labs Lab 05/19/13 0630 05/19/13 1215 05/19/13 1910  TROPONINI 0.33* 0.31* <0.30   BNP:  Recent Labs Lab 05/18/13 1128  PROBNP  710.7*   CBG:  Recent Labs Lab 05/19/13 0741 05/19/13 1156 05/19/13 1709 05/19/13 2143 05/20/13 0823 05/20/13 1240  GLUCAP 173* 224* 193* 216* 125* 188*   Urinalysis:  Recent Labs Lab 05/18/13 1345  COLORURINE YELLOW  LABSPEC 1.009  PHURINE 5.0  GLUCOSEU NEGATIVE  HGBUR NEGATIVE  BILIRUBINUR NEGATIVE  KETONESUR NEGATIVE  PROTEINUR NEGATIVE  UROBILINOGEN 0.2  NITRITE NEGATIVE  LEUKOCYTESUR NEGATIVE    Micro Results: Recent Results (from the past 240 hour(s))  MRSA PCR SCREENING     Status: None   Collection Time    05/18/13  6:50 PM      Result Value Range Status   MRSA by PCR NEGATIVE  NEGATIVE Final   Comment:            The GeneXpert MRSA Assay (FDA     approved for NASAL specimens     only), is one component of a     comprehensive MRSA colonization     surveillance program. It is not     intended to diagnose MRSA     infection nor to guide or  monitor treatment for     MRSA infections.   Studies/Results: Mr Laqueta Jean Wo Contrast  05/18/2013   *RADIOLOGY REPORT*  Clinical Data: Evaluate for stroke  MRI HEAD WITHOUT AND WITH CONTRAST  Technique:  Multiplanar, multiecho pulse sequences of the brain and surrounding structures were obtained according to standard protocol without and with intravenous contrast  Contrast: 15mL MULTIHANCE GADOBENATE DIMEGLUMINE 529 MG/ML IV SOLN  Comparison: Prior CT from 09/29/2010.  Findings: Diffuse generalized atrophy is present.  Scattered T2 / FLAIR hyperintense foci within the periventricular deep white matter are most consistent with chronic microvascular ischemic disease. FLAIR signal with encephalomalacia within the high right frontal lobe near the vertex is consistent with remote infarct in this region.  No diffusion weighted signal abnormalities identified to suggest acute intracranial infarct.  There is no acute intracranial hemorrhage.  No midline shift or mass lesion.  No hydrocephalus.  There is no extra-axial fluid  collection.  Pituitary gland is normal.  Pituitary stalk is midline.  Optic nerves and optic chiasm are normal.  Craniocervical junction is within normal limits.  Corpus callosum is intact.  Calvarium and scalp soft tissues are normal.  No abnormal enhancement is seen within the brain on post contrast sequences.  There is mucosal enhancement with air fluid level within the right sphenoid sinus. Right mastoid effusion is present.  IMPRESSION: 1. No acute intracranial infarct. 2. Encephalomalacia with hyperintense T2/FLAIR signal within the high right frontal lobe near the vertex, consistent with remote ischemic infarct. 3.  Atrophy with microvascular ischemic changes. 4.  Right mastoid effusion. 5.  Right sphenoid sinus disease.   Original Report Authenticated By: Rise Mu, M.D.   Dg Chest Port 1 View  05/20/2013   CLINICAL DATA:  Airspace disease  EXAM: PORTABLE CHEST - 1 VIEW  COMPARISON:  05/18/2013  FINDINGS: Lungs are clear. No pleural effusion or pneumothorax.  Mild cardiomegaly.  Degenerative changes of the bilateral shoulders.  IMPRESSION: No evidence of acute cardiopulmonary disease.   Electronically Signed   By: Charline Bills M.D.   On: 05/20/2013 07:19   Medications: I have reviewed the patient's current medications. Scheduled Meds: . albuterol  2.5 mg Nebulization Q6H  . aspirin EC  81 mg Oral Daily  . ferrous sulfate  650 mg Oral QPM  . fluticasone  2 puff Inhalation BID  . gabapentin  300 mg Oral QHS  . insulin aspart  0-5 Units Subcutaneous QHS  . insulin aspart  0-9 Units Subcutaneous TID WC  . ipratropium  0.5 mg Nebulization Q6H  . latanoprost  1 drop Both Eyes QHS  . levofloxacin  250 mg Oral Q2000  . pantoprazole  40 mg Oral Daily  . predniSONE  40 mg Oral Daily  . ranolazine  500 mg Oral BID  . Rivaroxaban  20 mg Oral Q supper  . Ticagrelor  90 mg Oral BID   Continuous Infusions:  PRN Meds:.sodium chloride, acetaminophen, albuterol, albuterol, feeding  supplement, ipratropium, nitroGLYCERIN, ondansetron (ZOFRAN) IV, polyvinyl alcohol Assessment/Plan:  COPD and Chronic Resp failure: COPD exacebation is a likley cause of SOB with cough, but no signif wheeezing present on exam. Patient is a known smoker, uses O2 at home- 2L, says he has had SOB for up to 38yrs that is generally well controlled on inhalers. This admission he was transferred to the ICU 2/2 acute hypotension, where steroids and abx were started to treat a possible COPD exacerbation; will continue for 5 days total. - Continue Levaquin x5d -  Continue Prednisone 40mg  po x5d - Continue Duonebs q4H PRN.   Diastolic CHF: Presentation with cough productive of clear sputum, with SOB, with previous hx of CHF, and presence of crackles on exam, BLE edema, is supportive of CHF exacerbation. However CXR done did not show evidence of fluid overload. ECHO from 2012 with normal systolic function and EF. Aortic valve stenosis and mitral calcifications were seen- he is followed at the [facility] by Cardiology. He is not on Lasix at home, but was given a dose in the ED which did improve his fluid overload. No further Lasix was given. His BP meds have been held since 2/2 hypotension, but his pressures have improved and restarting lopressor today.  Holding home Lorsatan, Ranolaxine for CAD, NTG, Atorvastatin.  Elevated troponins: Troponins up to 0.33 on 9/20 when previously normal. Exacerbating factors- ACS/MI, pt has hx of CAD, though no complaints of chest pain. EKG was done and was unchanged from previous. Troponins were trended and began to improved. The elevated troponins were thought likely due to demand ischemia from the hypotensive episode.   Recent Labs Lab 05/18/13 1545 05/18/13 2219 05/19/13 0630 05/19/13 1215 05/19/13 1910  TROPONINI <0.30 <0.30 0.33* 0.31* <0.30    Hypotension: Patinet w/ epiosde of hypotension yesterday wIth BP- 60/50, 500cc NS was given with prompt improvement. Prior to this  episode, the patinet was gotten 1 dose of Lasix 40 IV in the ED, and had received his home antihypertensive medications of Lorsatan and Isosabide prior. Patient was transferred to CCU and then to SDU the following day as his pressures remained stable.  - Tx to tele bed  Hyponatremia: Pts Na has been chronically reduced. On admission Na 126, was asymptomatic. It has remained stable at 128 over the past few days. Pts baseline over the past 4 mths is 129. He is currently tolerating a heart healthy diet with sodium restriction.  - Continue diet w/o Na restriction - AM BMP  Syncope: Resolved. Likely due to hypotension from Lasix and antihypertensives, causing hypotension. Unlikely  Acute MI, EKG, done at the time showed no acute changes. Troponins mildly elevated as noted above.   DM- Home meds- 500mg  Metformin BID. CBG this morning 224. HBA1c- 07/29/2011- 6.9.  - [facility] - Continue metformin   CODE- DNR.  DVT PPX- On rivaroxaban.  Dispo: Disposition is deferred at this time, awaiting improvement of current medical problems.  Anticipated discharge in approximately 2-3 day(s).   The patient does have a current PCP [facility], MD) and does need an [facility] hospital follow-up appointment after discharge.  The patient does not know have transportation limitations that hinder transportation to clinic appointments.  .Services Needed at time of discharge: Y = Yes, Blank = No PT:   OT:   RN:   Equipment:   Other:     LOS: 2 days   Genelle Gather, MD 05/20/2013, 1:33 PM

## 2013-05-20 NOTE — Progress Notes (Signed)
1815 Transfer in from 2600 via wheelchair . Able to ambulate to the bed. With supervision . 1830 supper served . Eating independently

## 2013-05-20 NOTE — Progress Notes (Signed)
  Date: 05/20/2013  Patient name: Wilburn Keir  Medical record number: [id number]  Date of birth: [birth year]   This patient has been seen and the plan of care was discussed with the house staff. Please see their note for complete details. I concur with their findings with the following additions/corrections: Mr Stern is resting comfortably in bed and is finishing his neb. His breathing is nl and he is able to speak in full sentences. He has no complaints. Cont ABX and steroids for COPD exac. He was found to have  A R DVT but is on Xarelto since May for new B DVT';s found during that admission. Will cont med. His slightly elevated Trop are likely 2/2 to the hypotensive event. His EKG was without ischemic changes. PT/OT today.   Burns Spain, MD 05/20/2013, 10:39 AM

## 2013-05-21 LAB — BASIC METABOLIC PANEL
BUN: 16 mg/dL (ref 6–23)
CO2: 29 mEq/L (ref 19–32)
Creatinine, Ser: 0.86 mg/dL (ref 0.50–1.35)
GFR calc Af Amer: >90 mL/min (ref >90)
GFR calc non Af Amer: 78 mL/min — ABNORMAL LOW (ref >90)
Potassium: 4.2 mEq/L (ref 3.5–5.1)

## 2013-05-21 LAB — GLUCOSE, CAPILLARY: Glucose-Capillary: 147 mg/dL — ABNORMAL HIGH (ref 70–99)

## 2013-05-21 MED ORDER — IPRATROPIUM BROMIDE 0.02 % IN SOLN: 0.5000 mg | Freq: Three times a day (TID) | RESPIRATORY_TRACT | Status: DC

## 2013-05-21 MED ORDER — ALBUTEROL SULFATE (5 MG/ML) 0.5% IN NEBU: 2.5000 mg | INHALATION_SOLUTION | RESPIRATORY_TRACT | Status: DC | PRN

## 2013-05-21 MED ORDER — LEVOFLOXACIN 250 MG PO TABS: 250.0000 mg | ORAL_TABLET | Freq: Every day | ORAL | Status: AC

## 2013-05-21 MED ORDER — ALBUTEROL SULFATE (5 MG/ML) 0.5% IN NEBU: 2.5000 mg | INHALATION_SOLUTION | Freq: Three times a day (TID) | RESPIRATORY_TRACT | Status: DC

## 2013-05-21 MED ORDER — IPRATROPIUM BROMIDE 0.02 % IN SOLN: 0.5000 mg | RESPIRATORY_TRACT | Status: DC | PRN

## 2013-05-21 MED ORDER — IPRATROPIUM BROMIDE 0.02 % IN SOLN: 0.5000 mg | Freq: Four times a day (QID) | RESPIRATORY_TRACT | Status: AC | PRN

## 2013-05-21 MED ORDER — IPRATROPIUM BROMIDE 0.02 % IN SOLN
0.5000 mg | Freq: Three times a day (TID) | RESPIRATORY_TRACT | Status: DC
  Administered 2013-05-21 (×2): 0.5 mg via RESPIRATORY_TRACT
  Filled 2013-05-21 (×2): qty 2.5

## 2013-05-21 MED ORDER — ALBUTEROL SULFATE (5 MG/ML) 0.5% IN NEBU: 2.5000 mg | INHALATION_SOLUTION | Freq: Four times a day (QID) | RESPIRATORY_TRACT | Status: AC | PRN

## 2013-05-21 MED ORDER — ALBUTEROL SULFATE (5 MG/ML) 0.5% IN NEBU
2.5000 mg | INHALATION_SOLUTION | Freq: Three times a day (TID) | RESPIRATORY_TRACT | Status: DC
  Administered 2013-05-21 (×2): 2.5 mg via RESPIRATORY_TRACT
  Filled 2013-05-21 (×2): qty 0.5

## 2013-05-21 MED ORDER — PREDNISONE 20 MG PO TABS: 40.0000 mg | ORAL_TABLET | Freq: Every day | ORAL | Status: DC

## 2013-05-21 NOTE — Progress Notes (Signed)
1630 Discharge instructions and prescriptions given to ptr and daughter . Both verbalized understanding ,contacted Home [facility]  By Case management for O2 nebulazer use . Wheeled to lobby by tech

## 2013-05-21 NOTE — Discharge Summary (Signed)
Name: Eugene [facility]auer MRN: [id number] DOB: 1927/04/16 79 y.o. PCP: Eugene Sauce, [facility]  Date of Admission: 05/18/2013 11:01 AM Date of Discharge: 05/21/2013 Attending Physician: Eugene Spain, [facility]  Discharge Diagnosis:  Active Problems:   COPD exacerbation with Acute Respiratory Distress.   Hyponatrremia   CAD (coronary artery disease)   Anemia   Diabetes mellitus   Syncope and collapse   Chronic respiratory failure   DVT (deep venous thrombosis)  Discharge Medications:   Medication List    STOP taking these medications       isosorbide dinitrate 30 MG tablet  Commonly known as:  ISORDIL     losartan 25 MG tablet  Commonly known as:  COZAAR      TAKE these medications       acetaminophen 500 MG tablet  Commonly known as:  TYLENOL  Take 500 mg by mouth every 6 (six) hours as needed for pain.     albuterol (5 MG/ML) 0.5% nebulizer solution  Commonly known as:  PROVENTIL  Take 0.5 mLs (2.5 mg total) by nebulization every 6 (six) hours as needed for wheezing or shortness of breath.     albuterol 108 (90 BASE) MCG/ACT inhaler  Commonly known as:  PROVENTIL HFA;VENTOLIN HFA  Inhale 2 puffs into the lungs every 6 (six) hours as needed for wheezing.     aspirin 81 MG tablet  Take 81 mg by mouth daily.     atorvastatin 80 MG tablet  Commonly known as:  LIPITOR  Take 80 mg by mouth at bedtime.     ferrous sulfate 325 (65 FE) MG tablet  Take 650 mg by mouth every evening.     gabapentin 300 MG capsule  Commonly known as:  NEURONTIN  Take 300 mg by mouth at bedtime.     ipratropium 0.02 % nebulizer solution  Commonly known as:  ATROVENT  Take 2.5 mLs (0.5 mg total) by nebulization every 6 (six) hours as needed.     levofloxacin 250 MG tablet  Commonly known as:  LEVAQUIN  Take 1 tablet (250 mg total) by mouth daily at 8 pm.     metFORMIN 500 MG tablet  Commonly known as:  GLUCOPHAGE  Take 500 mg by mouth 2 (two) times daily with a meal.     metoprolol 50 MG tablet  Commonly known as:  LOPRESSOR  Take 25 mg by mouth 2 (two) times daily.     mometasone 220 MCG/INH inhaler  Commonly known as:  ASMANEX  Inhale 2 puffs into the lungs at bedtime.     nitroGLYCERIN 0.4 MG SL tablet  Commonly known as:  NITROSTAT  Place 0.4 mg under the tongue every 5 (five) minutes as needed for chest pain.     omeprazole 40 MG capsule  Commonly known as:  PRILOSEC  Take 1 capsule (40 mg total) by mouth 2 (two) times daily before a meal.     polyvinyl alcohol 1.4 % ophthalmic solution  Commonly known as:  LIQUIFILM TEARS  Place 1 drop into both eyes as needed.     predniSONE 20 MG tablet  Commonly known as:  DELTASONE  Take 2 tablets (40 mg total) by mouth daily.     ranolazine 500 MG 12 hr tablet  Commonly known as:  RANEXA  Take 500 mg by mouth 2 (two) times daily.     Rivaroxaban 20 MG Tabs tablet  Commonly known as:  XARELTO  Take 1 tablet (20 mg total)  by mouth daily with supper. First dose on Thu 01/25/13     tamsulosin 0.4 MG Caps capsule  Commonly known as:  FLOMAX  Take 0.4 mg by mouth daily after supper.     Ticagrelor 90 MG Tabs tablet  Commonly known as:  BRILINTA  Take 90 mg by mouth 2 (two) times daily.     Travoprost (BAK Free) 0.004 % Soln ophthalmic solution  Commonly known as:  TRAVATAN  Place 1 drop into both eyes at bedtime.        Disposition and follow-up:   Eugene [facility]auer was discharged from [facility] in Good condition.  At the hospital follow up visit please address:  1.  Losartan and Isosabide were discontinued prior to discharge as patient had an episode of hypotension. Other mediactions were gradually added, but these were not as we did not think patients blood pressure would tolerate them at the time. Please restart these medications on discharge as clincally indicated.  Follow-up Appointments:     Follow-up Information   Follow up with Eugene B[facility].    Specialty:  General Practice   Contact information:   [phone]  [facility] [place] [address] [phone]       Discharge Instructions: Discharge Orders   Future Orders Complete By Expires   (HEART FAILURE PATIENTS) Call [facility]:  Anytime you have any of the following symptoms: 1) 3 pound weight gain in 24 hours or 5 pounds in 1 week 2) shortness of breath, with or without a dry hacking cough 3) swelling in the hands, feet or stomach 4) if you have to sleep on extra pillows at night in order to breathe.  As directed    Call [facility] for:  difficulty breathing, headache or visual disturbances  As directed    Call [facility] for:  persistant dizziness or light-headedness  As directed    Call [facility] for:  temperature >100.4  As directed    Diet - low sodium heart healthy  As directed    Diet - low sodium heart healthy  As directed    Discharge instructions  As directed    Comments:     We will be reducing your dose of Prednisone gradually, over a 10day period. Start with 40mg  daily, for 4 days, then take 30mg  daily for 2 days, then take 20mg  daily for 2 days, then 10mg  daily for 2 days. We will also be completing a 5 day course of Levaquin, today will be your 4th day and tomorrow your last day. Also we will be prescribing medications to use with your nebulizer, also continue with your inhalers as previously prescribed.   Discharge instructions  As directed    Comments:     We will also be stopping most of your blood pressure medications and will be discharging you on Metoprolol which you will continue with at 25mg  twice a day. At this point your blood pressure is a bit on the low side, follow up with your doctor, and he will determine the need for more medications.   Increase activity slowly  As directed    Increase activity slowly  As directed       Consultations:  Pulm and critical Care.  Procedures Performed:  Dg Chest 2 View (if Patient Has Fever And/or Copd)  05/18/2013   CLINICAL DATA:  Shortness of  Breath. Fatigue.  EXAM: CHEST  2 VIEW  COMPARISON:  05/17/2013, 01/02/2013 and 06/15/2012.  FINDINGS: Central pulmonary vascular  prominence stable.  Calcified aorta.  Heart size top normal with coronary artery calcifications noted.  No infiltrate, congestive heart failure or pneumothorax.  No plain film evidence of pulmonary malignancy.  IMPRESSION: No acute abnormality. Please see above.   Electronically Signed   By: Bridgett Larsson   On: 05/18/2013 11:06   Dg Chest 2 View  05/17/2013   CLINICAL DATA:  Coughing and history of pneumonia.  EXAM: CHEST  2 VIEW  COMPARISON:  01/02/2013  FINDINGS: Two views of the chest demonstrate clear lungs. There is no focal airspace disease and no evidence for pulmonary edema. Bony thorax is intact. Heart and mediastinum are within normal limits.  IMPRESSION: No acute cardiopulmonary disease.   Electronically Signed   By: Richarda Overlie M.D.   On: 05/17/2013 20:42   Mr Laqueta Jean WU Contrast  05/18/2013   *RADIOLOGY REPORT*  Clinical Data: Evaluate for stroke  MRI HEAD WITHOUT AND WITH CONTRAST  Technique:  Multiplanar, multiecho pulse sequences of the brain and surrounding structures were obtained according to standard protocol without and with intravenous contrast  Contrast: 15mL MULTIHANCE GADOBENATE DIMEGLUMINE 529 MG/ML IV SOLN  Comparison: Prior CT from 09/29/2010.  Findings: Diffuse generalized atrophy is present.  Scattered T2 / FLAIR hyperintense foci within the periventricular deep white matter are most consistent with chronic microvascular ischemic disease. FLAIR signal with encephalomalacia within the high right frontal lobe near the vertex is consistent with remote infarct in this region.  No diffusion weighted signal abnormalities identified to suggest acute intracranial infarct.  There is no acute intracranial hemorrhage.  No midline shift or mass lesion.  No hydrocephalus.  There is no extra-axial fluid collection.  Pituitary gland is normal.  Pituitary stalk is  midline.  Optic nerves and optic chiasm are normal.  Craniocervical junction is within normal limits.  Corpus callosum is intact.  Calvarium and scalp soft tissues are normal.  No abnormal enhancement is seen within the brain on post contrast sequences.  There is mucosal enhancement with air fluid level within the right sphenoid sinus. Right mastoid effusion is present.  IMPRESSION: 1. No acute intracranial infarct. 2. Encephalomalacia with hyperintense T2/FLAIR signal within the high right frontal lobe near the vertex, consistent with remote ischemic infarct. 3.  Atrophy with microvascular ischemic changes. 4.  Right mastoid effusion. 5.  Right sphenoid sinus disease.   Original Report Authenticated By: Rise Mu, M.D.   Dg Chest Port 1 View  05/20/2013   CLINICAL DATA:  Airspace disease  EXAM: PORTABLE CHEST - 1 VIEW  COMPARISON:  05/18/2013  FINDINGS: Lungs are clear. No pleural effusion or pneumothorax.  Mild cardiomegaly.  Degenerative changes of the bilateral shoulders.  IMPRESSION: No evidence of acute cardiopulmonary disease.   Electronically Signed   By: Charline Bills M.D.   On: 05/20/2013 07:19   Admission HPI: Chief Complaint: Shortness of breath and Cough.   History of Present Illness: 61 y o male with PMH of COPD, DM, Diastolic CHF- last EF- 2012, 55-65%., Hperlipidemia and Hyponatremia.  Presented today with c/o of Cough- pt has always had a cough which was previously nonproductive, but got worse about 2weeks ago, productive of small clear sputum.  Worsening SOB also started abput the same time, pt says he has always had some difficulty breathing due to his COPD, used o2 at home for a while and Inhalers, symptoms mostly controlled , but this got worse over the past 2 weeks, absent at rest, but present when he moves around  in bed. Patient occasionally smokes.  No hx of fevers of chills, no chest pain, patient has not noticed any change in leg swelling, or abdominal swelling, not  aware of sick contacts.  Patinet was seen at the [facility], BMP showed Hyponatremia, and was sent to the Ed fo further eval. In the Ed chest xray done did not show any inflitrate, CHF or pneumothorax, BMP done showed- Na- 126, Cl- 87, blood gluc- 181. Other electrolytes were normal. I stat trop- Neg, CBC- wbc- 6.6, pro- BMP- 710, usually elevated. Patient was given 40 IV frusemide.   Hospital Course by problem list:   # COPD Exacerbation with Acute Respiratory Distress : Patient presented with difficulty breathing that had recently gotten worse, Productive cough, but no signif wheezing on exam, Patient is a known smoker, uses O2 at home- 2L, says he has had SOB due to COPD for up to 43yrs that is generally well controlled on inhalers. Chest xray was done showed no evidence of acute cardiopulm dx. On this admission he was transferred to the ICU 2/2 acute hypotension, where steroids- Prednisone 40mg  daily and antibiotics were started to treat a possible COPD exacerbation. Pt was also placed on albuterol and ipratropium nebulizers. Pt had a 5 day course of Levaquin, was discharged on steroid taper and a prescription to refill his meds so he can use his nebulizers at home and inhalers.   # Diastolic CHF: Presentation with cough productive of clear sputum, with SOB, with previous hx of CHF, and presence of crackles on exam, BLE edema, was supportive of CHF exacerbation. However CXR done did not show evidence of fluid overload. ECHO from 2012 with normal systolic function and EF, Aortic valve stenosis and mitral calcifications were seen- he is followed at the [facility] by Cardiology. He was not on Lasix at home. On the day of admission, he was given a dose-Iv frusemide-40mg , in the ED which did improve his fluid overload,but Pt was also given his isosabide and lorsatan- which were some of his home antihypertensives- as his blood pressure was elevated with systolic in the 170s, pt became  hypotensive,with Systolic- 60s. Pt was started on IVF N/s bolus- 500ccs, and then maintenance IVF, with care not to overload him. His BP meds were subsequently held, he was then transferred to ICU, and then to step down the next day. His pressures improved and gradually some of his home Bp meds were introduced on discharge.  Elevated troponins: On admission, pts troponins were WNL and EKG was normal. Troponins increased to 0.33- 05/18/2013, EKG was done and was unchanged from previous. Troponins were trended and began to improved. The elevated troponins were thought likely due to demand ischemia from the hypotensive episode.  Lab  05/18/13 1545  05/18/13 2219  05/19/13 0630  05/19/13 1215  05/19/13 1910   TROPONINI  <0.30  <0.30  0.33*  0.31*  <0.30    # Hyponatremia: As per chat, Pts Na has been chronically reduced. On admission Na 126, was asymptomatic. It has remained stable at 128 over the past few days. Pts baseline over the past 4 mths is 129. Hyponatremia likely due to fluid overload from [facility]. Pt was placed on a heart healthy diet with NA restriction on admission.  # DM- Home meds- 500mg  Metformin BID. Last HBA1c- 07/29/2011- 6.9. Follows with the [place]. Metformin continued on admission and on discharge.  # HTN- Home meds include- Lorsatan, metoprolol, Ranolaxine for CAD, also on Isosabide. Home meds continued on  discharge- Metoprolol- 25mg  BID,   # CAD- Currently stable, No EKG changes on admission, Troponins not concerning. Home meds- Ranolaxine, NTG, Isosabide, aspirin, Ticargrelor. Isosabide and losartan - Only meds Not continued on discharge, as patient had an episode of hypotension, PCP can determine need for continued use and add as indicated.  # DVT- On Xarelto, claims compliance. Was continued n admission and on Discharge.  # Hyperlipidemia- On atorvastatin- 80mg  by mouth daily. Continued on admission, and on discharge.  Discharge Vitals:   BP 151/68  Pulse 73  Temp(Src) 98.1  F (36.7 C) (Oral)  Resp 22  Ht 5\' 8"  (1.727 m)  Wt 153 lb 3.5 oz (69.5 kg)  BMI 23.3 kg/m2  SpO2 96%  Discharge Labs:  Results for orders placed during the hospital encounter of 05/18/13 (from the past 24 hour(s))  GLUCOSE, CAPILLARY     Status: Abnormal   Collection Time    05/20/13 12:40 PM      Result Value Range   Glucose-Capillary 188 (*) 70 - 99 mg/dL  GLUCOSE, CAPILLARY     Status: Abnormal   Collection Time    05/20/13  5:47 PM      Result Value Range   Glucose-Capillary 259 (*) 70 - 99 mg/dL  GLUCOSE, CAPILLARY     Status: Abnormal   Collection Time    05/20/13  9:12 PM      Result Value Range   Glucose-Capillary 219 (*) 70 - 99 mg/dL   Comment 1 Notify RN    BASIC METABOLIC PANEL     Status: Abnormal   Collection Time    05/21/13  4:00 AM      Result Value Range   Sodium 128 (*) 135 - 145 mEq/L   Potassium 4.2  3.5 - 5.1 mEq/L   Chloride 92 (*) 96 - 112 mEq/L   CO2 29  19 - 32 mEq/L   Glucose, Bld 128 (*) 70 - 99 mg/dL   BUN 16  6 - 23 mg/dL   Creatinine, Ser 1.61  0.50 - 1.35 mg/dL   Calcium 9.6  8.4 - 09.6 mg/dL   GFR calc non Af Amer 78 (*) >90 mL/min   GFR calc Af Amer >90  >90 mL/min  GLUCOSE, CAPILLARY     Status: Abnormal   Collection Time    05/21/13  6:36 AM      Result Value Range   Glucose-Capillary 128 (*) 70 - 99 mg/dL   Comment 1 Notify RN     Comment 2 Documented in Chart    GLUCOSE, CAPILLARY     Status: Abnormal   Collection Time    05/21/13 11:32 AM      Result Value Range   Glucose-Capillary 147 (*) 70 - 99 mg/dL   Comment 1 Notify RN      Signed: Kennis Carina, [facility] 05/21/2013, 11:53 AM   Time Spent on Discharge: 35 minutes Services Ordered on Discharge: None. Equipment Ordered on Discharge: None.

## 2013-05-21 NOTE — Progress Notes (Signed)
Inpatient Diabetes Program Recommendations  AACE/ADA: New Consensus Statement on Inpatient Glycemic Control (2013)  Target Ranges:  Prepandial:   less than 140 mg/dL      Peak postprandial:   less than 180 mg/dL (1-2 hours)      Critically ill patients:  140 - 180 mg/dL   Reason for Visit: Results for Eugene Bauer, Eugene [facility] (MRN [id number]) as of 05/21/2013 10:04  Ref. Range 05/20/2013 08:23 05/20/2013 12:40 05/20/2013 17:47 05/20/2013 21:12 05/21/2013 06:36  Glucose-Capillary Latest Range: 70-99 mg/dL 308 (H) 657 (H) 846 (H) 219 (H) 128 (H)   Consider adding Novolog meal coverage 3 units tid with meals while patient is on PO Prednisone.

## 2013-05-21 NOTE — Progress Notes (Signed)
  Date: 05/21/2013  Patient name: Eugene [facility]  Medical record number: [id number]  Date of birth: 03/01/1927   This patient has been seen and the plan of care was discussed with the house staff. Please see their note for complete details. I concur with their findings with the following additions/corrections: Mr Kruse is at baseline and is ready for d/C. He still has exp wheezes B but decent air movement. He will complete a total of 5 days of ABX and a 10 day course of a steroid taper.  Burns Spain, MD 05/21/2013, 3:31 PM

## 2013-05-21 NOTE — Progress Notes (Signed)
Physical Therapy Treatment Patient Details Name: Eugene Bauer MRN: [id number] DOB: 09-Sep-1926 Today's Date: 05/21/2013 Time: 1914-7829 PT Time Calculation (min): 23 min  PT Assessment / Plan / Recommendation  History of Present Illness 77 M with AS, CAD, CVA, Afib admitted for Cough/SOB 9/19 who developed hypotension, slurred speech, weakness after rising from sitting. Transferred to ICU for further monitoring   PT Comments   Progressing withmobility. Pt states he plans to d/c home later today. O2 sats 93% on RA with ambulation  Follow Up Recommendations  Home health PT;Supervision - Intermittent     Does the patient have the potential to tolerate intense rehabilitation     Barriers to Discharge        Equipment Recommendations  None recommended by PT    Recommendations for Other Services    Frequency Min 3X/week   Progress towards PT Goals Progress towards PT goals: Progressing toward goals  Plan Current plan remains appropriate    Precautions / Restrictions Precautions Precautions: Fall Restrictions Weight Bearing Restrictions: No   Pertinent Vitals/Pain No c/o pain    Mobility  Bed Mobility Bed Mobility: Not assessed Details for Bed Mobility Assistance: pt sitting in recliner Transfers Transfers: Sit to Stand;Stand to Sit Sit to Stand: 5: Supervision;From chair/3-in-1 Stand to Sit: 5: Supervision;To chair/3-in-1 Details for Transfer Assistance: Supervision for safety Ambulation/Gait Ambulation/Gait Assistance: 5: Supervision Ambulation Distance (Feet): 250 Feet Assistive device: Rolling walker Gait Pattern: Step-through pattern    Exercises     PT Diagnosis:    PT Problem List:   PT Treatment Interventions:     PT Goals (current goals can now be found in the care plan section)    Visit Information  Last PT Received On: 05/21/13 Assistance Needed: +1 History of Present Illness: 82 M with AS, CAD, CVA, Afib admitted for Cough/SOB 9/19 who developed  hypotension, slurred speech, weakness after rising from sitting. Transferred to ICU for further monitoring    Subjective Data      Cognition  Cognition Arousal/Alertness: Awake/alert Behavior During Therapy: WFL for tasks assessed/performed Overall Cognitive Status: Within Functional Limits for tasks assessed    Balance     End of Session PT - End of Session Activity Tolerance: Patient tolerated treatment well Patient left: in chair;with call bell/phone within reach   GP     Rebeca Alert, MPT Pager: [phone]

## 2013-05-21 NOTE — Progress Notes (Signed)
Subjective: No complaints today. Appears much better today. Ready to go home. Daughter at bedside. Tolerating orally. No complaints of SOB.  Objective: Vital signs in last 24 hours: Filed Vitals:   05/20/13 2059 05/21/13 0559 05/21/13 0900 05/21/13 0903  BP:  119/45    Pulse:  76    Temp:  98.1 F (36.7 C)    TempSrc:  Oral    Resp:  22    Height:      Weight:  153 lb 3.5 oz (69.5 kg)    SpO2: 96% 100% 96% 96%   Weight change: -2 lb 15.8 oz (-1.355 kg)  Intake/Output Summary (Last 24 hours) at 05/21/13 1117 Last data filed at 05/21/13 0858  Gross per 24 hour  Intake      0 ml  Output   1675 ml  Net  -1675 ml   Physical Exam, - General: Lying in bed, no complaints. HEENT: PERRL, EOMI, no scleral icterus.  Cardiac: RRR, no rubs, murmurs or gallops.  Pulm: Diminished breath sounds in right base and left. Clear to auscultation bilaterally. No added sounds. Abd: Soft, nontender, nondistended, BS present  Ext: Warm and well perfused, trace pitting edema of BLE  Neuro: Alert and oriented X3, cranial nerves II-XII grossly intact, non-focal   Lab Results: Basic Metabolic Panel:  Recent Labs Lab 05/19/13 0630 05/20/13 0412 05/21/13 0400  NA 128* 128* 128*  K 4.3 3.5 4.2  CL 92* 91* 92*  CO2 27 29 29   GLUCOSE 178* 162* 128*  BUN 16 13 16   CREATININE 1.16 0.98 0.86  CALCIUM 9.4 9.5 9.6  MG  --  1.5  --    Liver Function Tests:  Recent Labs Lab 05/18/13 1127  AST 16  ALT 17  ALKPHOS 92  BILITOT 0.3  PROT 7.2  ALBUMIN 4.1   CBC:  Recent Labs Lab 05/17/13 1945  05/18/13 1127 05/20/13 0412  WBC 6.2  --  6.6 8.7  NEUTROABS 4.0  --   --   --   HGB 11.5*  < > 11.8* 9.9*  HCT 31.9*  < > 33.4* 27.8*  MCV 80.6  --  81.3 80.8  PLT 190  --  191 166  < > = values in this interval not displayed. Cardiac Enzymes:  Recent Labs Lab 05/19/13 0630 05/19/13 1215 05/19/13 1910  TROPONINI 0.33* 0.31* <0.30   BNP:  Recent Labs Lab 05/18/13 1128  PROBNP  710.7*   D-Dimer: No results found for this basename: DDIMER,  in the last 168 hours CBG:  Recent Labs Lab 05/19/13 2143 05/20/13 0823 05/20/13 1240 05/20/13 1747 05/20/13 2112 05/21/13 0636  GLUCAP 216* 125* 188* 259* 219* 128*   Urinalysis:  Recent Labs Lab 05/18/13 1345  COLORURINE YELLOW  LABSPEC 1.009  PHURINE 5.0  GLUCOSEU NEGATIVE  HGBUR NEGATIVE  BILIRUBINUR NEGATIVE  KETONESUR NEGATIVE  PROTEINUR NEGATIVE  UROBILINOGEN 0.2  NITRITE NEGATIVE  LEUKOCYTESUR NEGATIVE    Micro Results: Recent Results (from the past 240 hour(s))  MRSA PCR SCREENING     Status: None   Collection Time    05/18/13  6:50 PM      Result Value Range Status   MRSA by PCR NEGATIVE  NEGATIVE Final   Comment:            The GeneXpert MRSA Assay (FDA     approved for NASAL specimens     only), is one component of a     comprehensive MRSA colonization  surveillance program. It is not     intended to diagnose MRSA     infection nor to guide or     monitor treatment for     MRSA infections.   Studies/Results: Dg Chest Port 1 View  05/20/2013   CLINICAL DATA:  Airspace disease  EXAM: PORTABLE CHEST - 1 VIEW  COMPARISON:  05/18/2013  FINDINGS: Lungs are clear. No pleural effusion or pneumothorax.  Mild cardiomegaly.  Degenerative changes of the bilateral shoulders.  IMPRESSION: No evidence of acute cardiopulmonary disease.   Electronically Signed   By: Charline Bills M.D.   On: 05/20/2013 07:19   Medications: I have reviewed the patient's current medications. Scheduled Meds: . albuterol  2.5 mg Nebulization TID  . aspirin EC  81 mg Oral Daily  . ferrous sulfate  650 mg Oral QPM  . fluticasone  2 puff Inhalation BID  . gabapentin  300 mg Oral QHS  . insulin aspart  0-5 Units Subcutaneous QHS  . insulin aspart  0-9 Units Subcutaneous TID WC  . ipratropium  0.5 mg Nebulization TID  . latanoprost  1 drop Both Eyes QHS  . levofloxacin  250 mg Oral Q2000  . metoprolol  25 mg  Oral BID  . pantoprazole  40 mg Oral Daily  . predniSONE  40 mg Oral Daily  . ranolazine  500 mg Oral BID  . Rivaroxaban  20 mg Oral Q supper  . Ticagrelor  90 mg Oral BID   Continuous Infusions:  PRN Meds:.sodium chloride, acetaminophen, albuterol, albuterol, feeding supplement, ipratropium, nitroGLYCERIN, ondansetron (ZOFRAN) IV, polyvinyl alcohol Assessment/Plan:  COPD and Chronic Resp failure: COPD exacebation is a likley cause of SOB with cough, but no signif wheeezing present on exam. Patient is a known smoker, uses O2 at home- 2L, says he has had SOB for up to 39yrs that is generally well controlled on inhalers. This admission he was transferred to the ICU 2/2 acute hypotension, where steroids and abx were started to treat a possible COPD exacerbation; will continue for 5 days total. [facility] - D/c home to continue levaquin x5d, today- 4th day.  - Continue Prednisone, will taper steriods- 40mg  daily for 4 days , 30mg  dly for 2 days, 20mg  dly for 2 days, 10mg  dly for 2 days. - Continue Duonebs q4H PRN.   Diastolic CHF: Presentation with cough productive of clear sputum, with SOB, with previous hx of CHF, and presence of crackles on exam, BLE edema, is supportive of CHF exacerbation. However CXR done did not show evidence of fluid overload. ECHO from 2012 with normal systolic function and EF. Aortic valve stenosis and mitral calcifications were seen- he is followed at the [facility] by Cardiology. He is not on Lasix at home, but was given a dose in the ED which did improve his fluid overload. No further Lasix was given. His BP meds have been held since 2/2 hypotension, but his pressures have improved and restarting lopressor today. Holding home Lorsatan, Ranolaxine for CAD, NTG, Atorvastatin.   Elevated troponins: Troponins up to 0.33 on 9/20 when previously normal. Exacerbating factors- ACS/MI, pt has hx of CAD, though no complaints of chest pain. EKG was done and was  unchanged from previous. Troponins were trended and began to improve. The elevated troponins were thought likely due to demand ischemia from the hypotensive episode.   Recent Labs  Lab  05/18/13 1545  05/18/13 2219  05/19/13 0630  05/19/13 1215  05/19/13 1910  TROPONINI  <0.30  <0.30  0.33*  0.31*  <0.30    Hypotension: Resolved. Patinet w/ epiosde of hypotension yesterday wIth BP- 60/50, 500cc NS was given with prompt improvement. Prior to this episode, the patinet was gotten 1 dose of Lasix 40 IV in the ED, and had received his home antihypertensive medications of Lorsatan and Isosabide prior. Patient was transferred to CCU and then to SDU the following day as his pressures remained stable.  - D/c home today.  Hyponatremia: Back to baseline. Pts Na has been chronically reduced. On admission Na 126, was asymptomatic. It has remained stable at 128 over the past few days. Pts baseline over the past 4 mths is 129. He is currently tolerating a heart healthy diet with sodium restriction.  - Continue diet w/o Na restriction. - Will Discharge home on Metoprolol for BP control, PCP can add other antiHTN as indicated.  Syncope: Resolved. Likely due to hypotension from Lasix and antihypertensives, causing hypotension. Unlikely Acute MI, EKG, done at the time showed no acute changes. Troponins mildly elevated as noted above, but subsequently returned to normal.  DM- Home meds- 500mg  Metformin BID. CBG this morning 128. HBA1c- 07/29/2011- 6.9.  - [facility].  - Continue metformin on discharge.  CODE- DNR.  DVT PPX- On rivaroxaban.   Dispo: Disposition is deferred at this time, awaiting improvement of current medical problems.  Anticipated discharge in approximately to day.   The patient does have a current PCP [facility], MD) and does need an [facility] hospital follow-up appointment after discharge.  The patient does not know have transportation limitations that hinder  transportation to clinic appointments.  .Services Needed at time of discharge: Y = Yes, Blank = No PT:   OT:   RN:   Equipment:   Other:     LOS: 3 days   Kennis Carina, MD 05/21/2013, 11:17 AM

## 2013-05-21 NOTE — Progress Notes (Signed)
OT NOTE  OT arrival with RN providing d/c instructions. Pt currently dressed with family present pending d/c. Pt and family currently eager to d/c home and no questions for OT.    Mateo Flow   OTR/L Pager: [phone] Office: [phone] .

## 2013-05-21 NOTE — Progress Notes (Signed)
DME nebulizer machine ordered for home use.

## 2013-06-08 ENCOUNTER — Inpatient Hospital Stay (HOSPITAL_COMMUNITY)
Admission: EM | Admit: 2013-06-08 | Discharge: 2013-06-11 | DRG: 194 | Disposition: A | Discharge status: Skilled Nursing Facility | Payer: Medicare Other | Attending: Internal Medicine | Admitting: Internal Medicine

## 2013-06-08 ENCOUNTER — Encounter (HOSPITAL_COMMUNITY): Payer: Self-pay | Admitting: Emergency Medicine

## 2013-06-08 ENCOUNTER — Emergency Department (HOSPITAL_COMMUNITY): Payer: Medicare Other

## 2013-06-08 DIAGNOSIS — I2510 Atherosclerotic heart disease of native coronary artery without angina pectoris: Secondary | ICD-10-CM | POA: Diagnosis present

## 2013-06-08 DIAGNOSIS — I509 Heart failure, unspecified: Secondary | ICD-10-CM | POA: Diagnosis present

## 2013-06-08 DIAGNOSIS — Y9210 Unspecified residential institution as the place of occurrence of the external cause: Secondary | ICD-10-CM | POA: Diagnosis present

## 2013-06-08 DIAGNOSIS — W19XXXA Unspecified fall, initial encounter: Secondary | ICD-10-CM | POA: Diagnosis present

## 2013-06-08 DIAGNOSIS — I359 Nonrheumatic aortic valve disorder, unspecified: Secondary | ICD-10-CM | POA: Diagnosis present

## 2013-06-08 DIAGNOSIS — R7989 Other specified abnormal findings of blood chemistry: Secondary | ICD-10-CM

## 2013-06-08 DIAGNOSIS — Z8673 Personal history of transient ischemic attack (TIA), and cerebral infarction without residual deficits: Secondary | ICD-10-CM

## 2013-06-08 DIAGNOSIS — Z9181 History of falling: Secondary | ICD-10-CM

## 2013-06-08 DIAGNOSIS — E785 Hyperlipidemia, unspecified: Secondary | ICD-10-CM | POA: Diagnosis present

## 2013-06-08 DIAGNOSIS — D649 Anemia, unspecified: Secondary | ICD-10-CM | POA: Diagnosis present

## 2013-06-08 DIAGNOSIS — Z9981 Dependence on supplemental oxygen: Secondary | ICD-10-CM

## 2013-06-08 DIAGNOSIS — E119 Type 2 diabetes mellitus without complications: Secondary | ICD-10-CM | POA: Diagnosis present

## 2013-06-08 DIAGNOSIS — I5030 Unspecified diastolic (congestive) heart failure: Secondary | ICD-10-CM | POA: Diagnosis present

## 2013-06-08 DIAGNOSIS — I252 Old myocardial infarction: Secondary | ICD-10-CM

## 2013-06-08 DIAGNOSIS — K219 Gastro-esophageal reflux disease without esophagitis: Secondary | ICD-10-CM | POA: Diagnosis present

## 2013-06-08 DIAGNOSIS — I10 Essential (primary) hypertension: Secondary | ICD-10-CM | POA: Diagnosis present

## 2013-06-08 DIAGNOSIS — Z66 Do not resuscitate: Secondary | ICD-10-CM | POA: Diagnosis present

## 2013-06-08 DIAGNOSIS — E871 Hypo-osmolality and hyponatremia: Secondary | ICD-10-CM | POA: Diagnosis present

## 2013-06-08 DIAGNOSIS — M6282 Rhabdomyolysis: Secondary | ICD-10-CM | POA: Diagnosis present

## 2013-06-08 DIAGNOSIS — Z9861 Coronary angioplasty status: Secondary | ICD-10-CM

## 2013-06-08 DIAGNOSIS — J189 Pneumonia, unspecified organism: Principal | ICD-10-CM | POA: Diagnosis present

## 2013-06-08 DIAGNOSIS — Z8546 Personal history of malignant neoplasm of prostate: Secondary | ICD-10-CM

## 2013-06-08 DIAGNOSIS — R55 Syncope and collapse: Secondary | ICD-10-CM

## 2013-06-08 DIAGNOSIS — I214 Non-ST elevation (NSTEMI) myocardial infarction: Secondary | ICD-10-CM

## 2013-06-08 DIAGNOSIS — R4182 Altered mental status, unspecified: Secondary | ICD-10-CM

## 2013-06-08 DIAGNOSIS — Z86718 Personal history of other venous thrombosis and embolism: Secondary | ICD-10-CM

## 2013-06-08 DIAGNOSIS — J690 Pneumonitis due to inhalation of food and vomit: Secondary | ICD-10-CM

## 2013-06-08 DIAGNOSIS — J4489 Other specified chronic obstructive pulmonary disease: Secondary | ICD-10-CM | POA: Diagnosis present

## 2013-06-08 DIAGNOSIS — I82409 Acute embolism and thrombosis of unspecified deep veins of unspecified lower extremity: Secondary | ICD-10-CM | POA: Diagnosis present

## 2013-06-08 DIAGNOSIS — Z85828 Personal history of other malignant neoplasm of skin: Secondary | ICD-10-CM

## 2013-06-08 DIAGNOSIS — J449 Chronic obstructive pulmonary disease, unspecified: Secondary | ICD-10-CM | POA: Diagnosis present

## 2013-06-08 DIAGNOSIS — G609 Hereditary and idiopathic neuropathy, unspecified: Secondary | ICD-10-CM | POA: Diagnosis present

## 2013-06-08 LAB — COMPREHENSIVE METABOLIC PANEL
ALT: 22 U/L (ref 0–53)
Albumin: 2.7 g/dL — ABNORMAL LOW (ref 3.5–5.2)
Alkaline Phosphatase: 54 U/L (ref 39–117)
Calcium: 9.5 mg/dL (ref 8.4–10.5)
Creatinine, Ser: 0.77 mg/dL (ref 0.50–1.35)
Glucose, Bld: 135 mg/dL — ABNORMAL HIGH (ref 70–99)
Potassium: 4.2 mEq/L (ref 3.5–5.1)
Sodium: 127 mEq/L — ABNORMAL LOW (ref 135–145)
Total Protein: 5.9 g/dL — ABNORMAL LOW (ref 6.0–8.3)

## 2013-06-08 LAB — URINALYSIS, ROUTINE W REFLEX MICROSCOPIC
Bilirubin Urine: NEGATIVE
Glucose, UA: NEGATIVE mg/dL
Leukocytes, UA: NEGATIVE
pH: 7.5 (ref 5.0–8.0)

## 2013-06-08 LAB — CBC WITH DIFFERENTIAL/PLATELET
Basophils Relative: 0 % (ref 0–1)
Eosinophils Absolute: 0 10*3/uL (ref 0.0–0.7)
Lymphs Abs: 0.4 10*3/uL — ABNORMAL LOW (ref 0.7–4.0)
MCH: 29.1 pg (ref 26.0–34.0)
MCV: 80.5 fL (ref 78.0–100.0)
Neutrophils Relative %: 87 % — ABNORMAL HIGH (ref 43–77)
Platelets: 140 10*3/uL — ABNORMAL LOW (ref 150–400)
RBC: 3.54 MIL/uL — ABNORMAL LOW (ref 4.22–5.81)
RDW: 15.4 % (ref 11.5–15.5)

## 2013-06-08 LAB — PRO B NATRIURETIC PEPTIDE: Pro B Natriuretic peptide (BNP): 4358 pg/mL — ABNORMAL HIGH (ref 0–450)

## 2013-06-08 LAB — URINE MICROSCOPIC-ADD ON

## 2013-06-08 LAB — POCT I-STAT TROPONIN I

## 2013-06-08 LAB — CK TOTAL AND CKMB (NOT AT ARMC): CK, MB: 10.7 ng/mL (ref 0.3–4.0)

## 2013-06-08 MED ORDER — ASPIRIN 325 MG PO TABS
325.0000 mg | ORAL_TABLET | Freq: Once | ORAL | Status: AC
  Administered 2013-06-09: 325 mg via ORAL
  Filled 2013-06-08: qty 1

## 2013-06-08 MED ORDER — VANCOMYCIN HCL IN DEXTROSE 1-5 GM/200ML-% IV SOLN
1000.0000 mg | Freq: Once | INTRAVENOUS | Status: AC
  Administered 2013-06-09: 1000 mg via INTRAVENOUS
  Filled 2013-06-08: qty 200

## 2013-06-08 MED ORDER — PIPERACILLIN-TAZOBACTAM 3.375 G IVPB
3.3750 g | Freq: Once | INTRAVENOUS | Status: DC
  Filled 2013-06-08: qty 50

## 2013-06-08 NOTE — ED Provider Notes (Addendum)
CSN: [id number]     Arrival date & time 06/08/13  1933 History   First MD Initiated Contact with Patient 06/08/13 2014     Chief Complaint  Patient presents with  . Fall   (Consider location/radiation/quality/duration/timing/severity/associated sxs/prior Treatment) The history is provided by the patient, the EMS personnel and the nursing home. The history is limited by the condition of the patient.  Eugene Bauer is a 79 y.o. male hx of aortic stenosis, COPD, CHF, MI here presenting with syncope. Patient said he fell a couple days ago and was unable to get up. The nursing home checked up on him today and found him on the floor. Has some bilateral hip pain. Denies any fevers or chills. Has some baseline shortness of breath.   Level V caveat- AMS  Past Medical History  Diagnosis Date  . Aortic stenosis   . High cholesterol   . COPD (chronic obstructive pulmonary disease)   . Retinal artery occlusion   . Emphysema   . GERD (gastroesophageal reflux disease)   . Coronary artery disease   . Heart murmur   . On home oxygen therapy     "2L all the time" (01/02/2013)  . Angina   . Hypertension   . CHF (congestive heart failure)   . MI (myocardial infarction)     "I've had several; last one was 03/09/2012" (01/02/2013  . Asthma   . Pneumonia     "today; have had it at least twice before" (01/02/2013)  . Shortness of breath     "most of the time recently" (01/02/2013)  . Chronic bronchitis   . Type II diabetes mellitus   . History of stomach ulcers     "have them now; they flare up when I eat the wrong thing or don't eat" (01/02/2013)  . Hepatitis     "don't know which one" (01/02/2013)  . FAOZHYQM(578.4)     "probably weekly" (01/02/2013)  . Stroke     "several; left ankle/foot still weak from one of them" (01/02/2013)  . Arthritis     "all over" (01/02/2013)  . DJD (degenerative joint disease)     "all over" (01/02/2013)  . Chronic lower back pain   . Urinary urgency   . Prostate cancer      "had radiation treatments" (01/02/2013)  . Basal cell carcinoma     "left nose, behind right knee" (01/02/2013)  . Anemia     chronic/notes 01/02/2013   Past Surgical History  Procedure Laterality Date  . Shoulder arthroscopy w/ rotator cuff repair Right 1990's; 2000's    "they've operated on it twice" (01/02/2013)  . Sp pta peripheral      Bilateral leg stents  . Inguinal hernia repair Bilateral     "1 wk apart" (01/02/2013)  . Coronary stent placement      "I've had 7 or 8 stents put in my heart" (01/02/2013)  . Cardiac catheterization      "couldn't get new type of stents put in" (01/02/2013)  . Coronary angioplasty with stent placement      "I've had 7 or 8 stents put in my heart; last stents put in ~ 2 wk ago" (01/02/2013)  . Back surgery    . Anterior fusion cervical spine      "C5-7; took bone off my right hip" (5-01/2013)   Family History  Problem Relation Age of Onset  . Coronary artery disease Brother 68  . Coronary artery disease Sister    History  Substance  Use Topics  . Smoking status: Never Smoker   . Smokeless tobacco: Former User    Types: Chew     Comment: 01/02/2013 "chewed in high school playing ball"  . Alcohol Use: No    Review of Systems  Musculoskeletal:       Bilateral hip pain   All other systems reviewed and are negative.    Allergies  Iodine and Iohexol  Home Medications   Current Outpatient Rx  Name  Route  Sig  Dispense  Refill  . acetaminophen (TYLENOL) 500 MG tablet   Oral   Take 500 mg by mouth every 6 (six) hours as needed for pain.         Marland Kitchen albuterol (PROVENTIL HFA;VENTOLIN HFA) 108 (90 BASE) MCG/ACT inhaler   Inhalation   Inhale 2 puffs into the lungs every 6 (six) hours as needed for wheezing.         Marland Kitchen albuterol (PROVENTIL) (5 MG/ML) 0.5% nebulizer solution   Nebulization   Take 0.5 mLs (2.5 mg total) by nebulization every 6 (six) hours as needed for wheezing or shortness of breath.   20 mL   12   . aspirin 81 MG tablet    Oral   Take 81 mg by mouth daily.           Marland Kitchen atorvastatin (LIPITOR) 80 MG tablet   Oral   Take 80 mg by mouth at bedtime.         . ferrous sulfate 325 (65 FE) MG tablet   Oral   Take 650 mg by mouth every evening.         . gabapentin (NEURONTIN) 300 MG capsule   Oral   Take 300 mg by mouth at bedtime.           Marland Kitchen ipratropium (ATROVENT) 0.02 % nebulizer solution   Nebulization   Take 2.5 mLs (0.5 mg total) by nebulization every 6 (six) hours as needed.   75 mL   12   . metFORMIN (GLUCOPHAGE) 500 MG tablet   Oral   Take 500 mg by mouth 2 (two) times daily with a meal.         . metoprolol (LOPRESSOR) 50 MG tablet   Oral   Take 25 mg by mouth 2 (two) times daily.         . mometasone (ASMANEX) 220 MCG/INH inhaler   Inhalation   Inhale 2 puffs into the lungs at bedtime.          . nitroGLYCERIN (NITROSTAT) 0.4 MG SL tablet   Sublingual   Place 0.4 mg under the tongue every 5 (five) minutes as needed for chest pain.         Marland Kitchen omeprazole (PRILOSEC) 40 MG capsule   Oral   Take 1 capsule (40 mg total) by mouth 2 (two) times daily before a meal.         . polyvinyl alcohol (LIQUIFILM TEARS) 1.4 % ophthalmic solution   Both Eyes   Place 1 drop into both eyes as needed.   15 mL      . predniSONE (DELTASONE) 20 MG tablet   Oral   Take 2 tablets (40 mg total) by mouth daily.   15 tablet   0     40mg  daily for 4days, 30mg  daily for 2 days, 20mg   ...   . ranolazine (RANEXA) 500 MG 12 hr tablet   Oral   Take 500 mg by mouth 2 (two) times daily.           Marland Kitchen  Rivaroxaban (XARELTO) 20 MG TABS   Oral   Take 1 tablet (20 mg total) by mouth daily with supper. First dose on Thu 01/25/13   30 tablet      . tamsulosin (FLOMAX) 0.4 MG CAPS capsule   Oral   Take 0.4 mg by mouth daily after supper.         . Ticagrelor (BRILINTA) 90 MG TABS tablet   Oral   Take 90 mg by mouth 2 (two) times daily.         . Travoprost, BAK Free, (TRAVATAN) 0.004 %  SOLN ophthalmic solution   Both Eyes   Place 1 drop into both eyes at bedtime.            BP 132/53  Pulse 83  Temp(Src) 99.1 F (37.3 C) (Oral)  Resp 26  Ht 5\' 9"  (1.753 m)  Wt 172 lb (78.019 kg)  BMI 25.39 kg/m2  SpO2 93% Physical Exam  Nursing note and vitals reviewed. Constitutional: He is oriented to person, place, and time.  Disheveled   HENT:  Head: Normocephalic and atraumatic.  Mouth/Throat: Oropharynx is clear and moist.  Eyes: Conjunctivae are normal. Pupils are equal, round, and reactive to light.  Neck: Normal range of motion. Neck supple.  Cardiovascular: Normal rate, regular rhythm and normal heart sounds.   Pulmonary/Chest: Effort normal.  + crackles bilateral bases   Abdominal: Soft. Bowel sounds are normal. He exhibits no distension. There is no tenderness. There is no rebound.  Musculoskeletal: Normal range of motion.  1+ edema. Dec ROM bilateral hips   Neurological: He is alert and oriented to person, place, and time.  Skin: Skin is warm and dry.  Psychiatric: He has a normal mood and affect. His behavior is normal. Judgment and thought content normal.    ED Course  Procedures (including critical care time) CRITICAL CARE Performed by: Silverio Lay, [place]   Total critical care time: 30 min   Critical care time was exclusive of separately billable procedures and treating other patients.  Critical care was necessary to treat or prevent imminent or life-threatening deterioration.  Critical care was time spent personally by me on the following activities: development of treatment plan with patient and/or surrogate as well as nursing, discussions with consultants, evaluation of patient's response to treatment, examination of patient, obtaining history from patient or surrogate, ordering and performing treatments and interventions, ordering and review of laboratory studies, ordering and review of radiographic studies, pulse oximetry and re-evaluation of patient's  condition.   Labs Review Labs Reviewed  CBC WITH DIFFERENTIAL - Abnormal; Notable for the following:    WBC 14.3 (*)    RBC 3.54 (*)    Hemoglobin 10.3 (*)    HCT 28.5 (*)    MCHC 36.1 (*)    Platelets 140 (*)    Neutrophils Relative % 87 (*)    Neutro Abs 12.5 (*)    Lymphocytes Relative 3 (*)    Lymphs Abs 0.4 (*)    Monocytes Absolute 1.4 (*)    All other components within normal limits  COMPREHENSIVE METABOLIC PANEL - Abnormal; Notable for the following:    Sodium 127 (*)    Chloride 91 (*)    Glucose, Bld 135 (*)    Total Protein 5.9 (*)    Albumin 2.7 (*)    AST 52 (*)    GFR calc non Af Amer 82 (*)    All other components within normal limits  CK TOTAL AND CKMB -  Abnormal; Notable for the following:    Total CK 1281 (*)    CK, MB 10.7 (*)    All other components within normal limits  URINALYSIS, ROUTINE W REFLEX MICROSCOPIC - Abnormal; Notable for the following:    Protein, ur 30 (*)    All other components within normal limits  PRO B NATRIURETIC PEPTIDE - Abnormal; Notable for the following:    Pro B Natriuretic peptide (BNP) 4358.0 (*)    All other components within normal limits  PROTIME-INR - Abnormal; Notable for the following:    Prothrombin Time 31.1 (*)    INR 3.14 (*)    All other components within normal limits  POCT I-STAT TROPONIN I - Abnormal; Notable for the following:    Troponin i, poc 1.33 (*)    All other components within normal limits  CULTURE, BLOOD (ROUTINE X 2)  CULTURE, BLOOD (ROUTINE X 2)  URINE MICROSCOPIC-ADD ON   Imaging Review Dg Pelvis 1-2 Views  06/08/2013   CLINICAL DATA:  Larey Seat. Weakness.  EXAM: PELVIS - 1-2 VIEW  COMPARISON:  Abdomen radiographs dated 09/30/2010.  FINDINGS: No fracture dislocation. Arterial calcifications. Bilateral common iliac stents.  IMPRESSION: No fracture or dislocation.   Electronically Signed   By: Gordan Payment M.D.   On: 06/08/2013 21:39   Ct Head Wo Contrast  06/08/2013   *RADIOLOGY REPORT*   Clinical Data:  Status post fall.  Concern for head or cervical spine injury.  CT HEAD WITHOUT CONTRAST AND CT CERVICAL SPINE WITHOUT CONTRAST  Technique:  Multidetector CT imaging of the head and cervical spine was performed following the standard protocol without intravenous contrast.  Multiplanar CT image reconstructions of the cervical spine were also generated.  Comparison: CT of the head the cervical spine performed 04/27/2010, and MRI of the brain performed 05/18/2013  CT HEAD  Findings: There is no evidence of acute infarction, mass lesion, or intra- or extra-axial hemorrhage on CT.  Prominence of the ventricles and sulci reflects mild cortical volume loss.  Scattered periventricular white matter change likely reflects small vessel ischemic microangiopathy.  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  The brainstem and fourth ventricle are within normal limits.  The basal ganglia are unremarkable in appearance.  The cerebral hemispheres demonstrate grossly normal gray-white differentiation. No mass effect or midline shift is seen.  There is no evidence of fracture; visualized osseous structures are unremarkable in appearance.  The orbits are within normal limits. The paranasal sinuses and mastoid air cells are well-aerated.  No significant soft tissue abnormalities are seen.  IMPRESSION:  1.  No evidence of traumatic intracranial injury or fracture. 2.  Mild cortical volume loss and scattered small vessel ischemic microangiopathy. 3.  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  CT CERVICAL SPINE  Findings: There is no evidence of fracture or subluxation. Vertebral bodies demonstrate normal height and alignment.  There is mild narrowing of the intervertebral disc space at C3-C4.  There is chronic osseous fusion at C5-C7.  Small posterior disc osteophyte  complexes are noted along the cervical spine.  Prevertebral soft tissues are within normal limits.  The visualized portions of the thyroid gland are unremarkable in appearance.  The visualized lung apices are clear.  Dense calcification is noted at the carotid bifurcations bilaterally.  IMPRESSION:  1.  No evidence of fracture or subluxation along the cervical spine. 2.  Minimal degenerative change along the cervical spine, with chronic osseous fusion at C5-C7. 3.  Dense calcification at the carotid bifurcations bilaterally. Carotid ultrasound would be helpful for further evaluation, on an elective non-emergent basis.   Original Report Authenticated By: Tonia Ghent, M.D.   Ct Cervical Spine Wo Contrast  06/08/2013   *RADIOLOGY REPORT*  Clinical Data:  Status post fall.  Concern for head or cervical spine injury.  CT HEAD WITHOUT CONTRAST AND CT CERVICAL SPINE WITHOUT CONTRAST  Technique:  Multidetector CT imaging of the head and cervical spine was performed following the standard protocol without intravenous contrast.  Multiplanar CT image reconstructions of the cervical spine were also generated.  Comparison: CT of the head the cervical spine performed 04/27/2010, and MRI of the brain performed 05/18/2013  CT HEAD  Findings: There is no evidence of acute infarction, mass lesion, or intra- or extra-axial hemorrhage on CT.  Prominence of the ventricles and sulci reflects mild cortical volume loss.  Scattered periventricular white matter change likely reflects small vessel ischemic microangiopathy.  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  The brainstem and fourth ventricle are within normal limits.  The basal ganglia are unremarkable in appearance.  The cerebral hemispheres demonstrate grossly normal gray-white differentiation. No mass effect or midline shift is seen.  There is no evidence of fracture; visualized osseous  structures are unremarkable in appearance.  The orbits are within normal limits. The paranasal sinuses and mastoid air cells are well-aerated.  No significant soft tissue abnormalities are seen.  IMPRESSION:  1.  No evidence of traumatic intracranial injury or fracture. 2.  Mild cortical volume loss and scattered small vessel ischemic microangiopathy. 3.  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  CT CERVICAL SPINE  Findings: There is no evidence of fracture or subluxation. Vertebral bodies demonstrate normal height and alignment.  There is mild narrowing of the intervertebral disc space at C3-C4.  There is chronic osseous fusion at C5-C7.  Small posterior disc osteophyte complexes are noted along the cervical spine.  Prevertebral soft tissues are within normal limits.  The visualized portions of the thyroid gland are unremarkable in appearance.  The visualized lung apices are clear.  Dense calcification is noted at the carotid bifurcations bilaterally.  IMPRESSION:  1.  No evidence of fracture or subluxation along the cervical spine. 2.  Minimal degenerative change along the cervical spine, with chronic osseous fusion at C5-C7. 3.  Dense calcification at the carotid bifurcations bilaterally. Carotid ultrasound would be helpful for further evaluation, on an elective non-emergent basis.   Original Report Authenticated By: Tonia Ghent, M.D.   Dg Chest Portable 1 View  06/08/2013   CLINICAL DATA:  History of trauma from a fall complaining chest pain.  EXAM: PORTABLE CHEST - 1 VIEW  COMPARISON:  CHEST x-ray 04/07/2020 2014.  FINDINGS: New area of airspace consolidation in the left lower lobe partially obscuring the left hemidiaphragm. Probable small left pleural effusion. Right lung appears clear. Mild diffuse peribronchial cuffing. Heart size appears borderline enlarged. No evidence of pulmonary edema. Mediastinal contours are unremarkable.  Atherosclerosis in the thoracic aorta. No definite pneumothorax. Visualized portions of the bony thorax appears grossly intact.  IMPRESSION: 1. In the setting of recent trauma, the appearance of the left lower lobe is concerning for potential contusion, however, this would be unusual for a typical fall from a standing or seated position.  If the patient fell from a significant height, this warrants consideration. Alternatively, this may reflect sequela of aspiration associated with the trauma, or could simply indicate an underlying infection that pre-existed the prior trauma. Clinical correlation is recommended. There is a small left pleural effusion. 2. No pneumothorax or definite rib fractures identified. 3. Atherosclerosis.   Electronically Signed   By: Trudie Reed M.D.   On: 06/08/2013 20:36    EKG Interpretation   None      Date: 06/08/2013  Rate: 77  Rhythm: normal sinus rhythm  QRS Axis: normal  Intervals: normal  ST/T Wave abnormalities: nonspecific ST changes  Conduction Disutrbances:none  Narrative Interpretation:   Old EKG Reviewed: none available    MDM  No diagnosis found. Cassell Voorhies is a 79 y.o. male here with syncope. Concerned for ACS. He is on the floor for several days so will need to r/o rhabdomyolysis. Will do CT head/neck to r/o bleed vs fracture as well.   10 PM WBC elevated. cxr showed aspiration pneumonia. Given IV abx. Trop elevated but he is on xarelto so I held off heparin. I called cardiology who will evaluate patient. Will admit to internal medicine teaching service.       Richardean Canal, MD 06/08/13 [id number]  Richardean Canal, MD 06/08/13 [phone]

## 2013-06-08 NOTE — H&P (Signed)
Date: 06/09/2013               Patient Name:  Eugene Bauer MRN: [id number]  DOB: 29-Jul-1927 Age / Sex: 79 y.o., male   PCP: Eugene Sauce, MD         Medical Service: Internal Medicine Teaching Service         Attending Physician: Dr. Kem Bauer    First Contact: Dr. Claudell Bauer Pager: [phone]  Second Contact: Dr. Bosie Bauer Pager: [phone]       After Hours (After 5p/  First Contact Pager: [phone]  weekends / holidays): Second Contact Pager: [phone]   Chief Complaint: syncope  History of Present Illness:  [name] is a 79 year old man with history of dCHF (grade I), CAD, AS (mild to moderate), COPD, DM2, HTN who presents with syncope and fall two days ago.  Patient was accompanied by two of his daughters in ED Elita [phone], Dawn [phone]) but provided the majority of history himself.   Patient states that he was in his USOH on evening of Thursday 10/9 at his assisted living facility ; he took his evening medications and went to bed.   However, on Friday morning (10/10), he awoke in his bathroom floor with his walker nearby.  He does not remember getting up to go to the bathroom or what happened in the interim period.  He had difficulty getting to his emergency "pull string" to alert someone and was crawling around the bathroom for some time trying to do so.  After he had missed lunch and was late for dinner, staff came to check on him and found him down in bathroom and called EMS.  No confusion, dizziness, n/v/d.   Patient has baseline shortness of breath, no increase from baseline.  He also has chronic cough, no increase from baseline though daughters think it sounds "deeper" than usual; no sputum production or hemoptysis.  Patient reports some difficulty with choking when eating.  He does not smoke, does not use O2 at home (recently discontinued at [place] in 02/2013 per daughter).  Denies chest pain.   Patient was recently admitted on 05/18/13 for COPD exacerbation, treated with  levoquin, prednisone, and albuterol/ipatropium nebulizers.  He was discharged back to his assisted living facility.  He is a [place] patient but has not seen his PCP there since last hospitalization.     Meds: Current Facility-Administered Medications  Medication Dose Route Frequency Provider Last Rate Last Dose  . [START ON 06/10/2013] influenza vac split quadrivalent PF (FLUARIX) injection 0.5 mL  0.5 mL Intramuscular Tomorrow-1000 Eugene Blue, DO      . piperacillin-tazobactam (ZOSYN) IVPB 3.375 g  3.375 g Intravenous Once Eugene Canal, MD      . vancomycin (VANCOCIN) IVPB 1000 mg/200 mL premix  1,000 mg Intravenous Q12H Colleen Can, [facility]      home meds- albuterol, ASA, atorvastatin, gabapentin, ipratropium, metformin, metoprolol, mometasone, omeprazole, ranolazine, rivaroxaban, tamsulosin, ticagrelor  Allergies: Allergies as of 06/08/2013 - Review Complete 06/08/2013  Allergen Reaction Noted  . Iodine Shortness Of Breath 07/28/2011  . Iohexol  04/27/2010   Past Medical History  Diagnosis Date  . Aortic stenosis   . High cholesterol   . COPD (chronic obstructive pulmonary disease)   . Retinal artery occlusion   . Emphysema   . GERD (gastroesophageal reflux disease)   . Coronary artery disease   . Heart murmur   . On home oxygen therapy     "2L all the  time" (01/02/2013)  . Angina   . Hypertension   . CHF (congestive heart failure)   . MI (myocardial infarction)     "I've had several; last one was 03/09/2012" (01/02/2013  . Asthma   . Pneumonia     "today; have had it at least twice before" (01/02/2013)  . Shortness of breath     "most of the time recently" (01/02/2013)  . Chronic bronchitis   . Type II diabetes mellitus   . History of stomach ulcers     "have them now; they flare up when I eat the wrong thing or don't eat" (01/02/2013)  . Hepatitis     "don't know which one" (01/02/2013)  . YNWGNFAO(130.8)     "probably weekly" (01/02/2013)  . Stroke     "several; left  ankle/foot still weak from one of them" (01/02/2013)  . Arthritis     "all over" (01/02/2013)  . DJD (degenerative joint disease)     "all over" (01/02/2013)  . Chronic lower back pain   . Urinary urgency   . Prostate cancer     "had radiation treatments" (01/02/2013)  . Basal cell carcinoma     "left nose, behind right knee" (01/02/2013)  . Anemia     chronic/notes 01/02/2013   Past Surgical History  Procedure Laterality Date  . Shoulder arthroscopy w/ rotator cuff repair Right 1990's; 2000's    "they've operated on it twice" (01/02/2013)  . Sp pta peripheral      Bilateral leg stents  . Inguinal hernia repair Bilateral     "1 wk apart" (01/02/2013)  . Coronary stent placement      "I've had 7 or 8 stents put in my heart" (01/02/2013)  . Cardiac catheterization      "couldn't get new type of stents put in" (01/02/2013)  . Coronary angioplasty with stent placement      "I've had 7 or 8 stents put in my heart; last stents put in ~ 2 wk ago" (01/02/2013)  . Back surgery    . Anterior fusion cervical spine      "C5-7; took bone off my right hip" (5-01/2013)   Family History  Problem Relation Age of Onset  . Coronary artery disease Brother 55  . Coronary artery disease Sister    History   Social History  . Marital Status: Single    Spouse Name: N/A    Number of Children: N/A  . Years of Education: N/A   Occupational History  . Not on file.   Social History Main Topics  . Smoking status: Never Smoker   . Smokeless tobacco: Former User    Types: Chew     Comment: 01/02/2013 "chewed in high school playing ball"  . Alcohol Use: No  . Drug Use: No  . Sexual Activity: No   Other Topics Concern  . Not on file   Social History Narrative  . No narrative on file    Review of Systems: Review of Systems  Constitutional: Negative for fever, chills, malaise/fatigue and diaphoresis.  HENT: Negative for sore throat.   Eyes: Negative for blurred vision.  Respiratory: Positive for cough and  shortness of breath. Negative for sputum production and wheezing.   Cardiovascular: Positive for leg swelling. Negative for chest pain, palpitations and orthopnea.  Gastrointestinal: Negative for nausea, vomiting, abdominal pain and diarrhea.  Genitourinary: Negative for dysuria.  Musculoskeletal: Positive for falls.  Neurological: Positive for tingling, loss of consciousness and weakness. Negative for dizziness, seizures  and headaches.  Endo/Heme/Allergies: Positive for environmental allergies.    Physical Exam: Blood pressure 142/49, pulse 46, temperature 98.9 F (37.2 C), temperature source Oral, resp. rate 27, height 5\' 9"  (1.753 m), weight 159 lb 2.8 oz (72.2 kg), SpO2 93.00%. During exam, BP 132/53, HR 87, RR 20, 94% on RA General: alert, cooperative, and in no apparent distress HEENT: NCAT, vision grossly intact, oropharynx clear and non-erythematous  Neck: supple, no lymphadenopathy Lungs: crackles at left base, expiratory wheezes throughout, mildly increased work of respiration, no rhonchi Heart: regular rate and rhythm, III/VI crescendo-descresendo murmur best heard at LUSB, no rubs or gallops Abdomen: soft, non-tender, non-distended, normal bowel sounds Extremities: 1+ pitting edema to mid shins bilaterally, 2+ DP pulses bilaterally, no cyanosis, clubbing Neurologic: alert & oriented X3, cranial nerves II-XII intact, strength grossly intact, sensation intact to light touch   Lab results: Basic Metabolic Panel:  Recent Labs  08/65/78 2016  NA 127*  K 4.2  CL 91*  CO2 26  GLUCOSE 135*  BUN 14  CREATININE 0.77  CALCIUM 9.5   Liver Function Tests:  Recent Labs  06/08/13 2016  AST 52*  ALT 22  ALKPHOS 54  BILITOT 1.0  PROT 5.9*  ALBUMIN 2.7*   CBC:  Recent Labs  06/08/13 2016  WBC 14.3*  NEUTROABS 12.5*  HGB 10.3*  HCT 28.5*  MCV 80.5  PLT 140*   Cardiac Enzymes:  Recent Labs  06/08/13 2016 06/09/13 0015  CKTOTAL 1281*  --   CKMB 10.7*  --     TROPONINI  --  1.56*    Imaging results:  Dg Pelvis 1-2 Views  06/08/2013   CLINICAL DATA:  Larey Seat. Weakness.  EXAM: PELVIS - 1-2 VIEW  COMPARISON:  Abdomen radiographs dated 09/30/2010.  FINDINGS: No fracture dislocation. Arterial calcifications. Bilateral common iliac stents.  IMPRESSION: No fracture or dislocation.   Electronically Signed   By: Gordan Payment M.D.   On: 06/08/2013 21:39   Ct Head Wo Contrast  06/08/2013   *RADIOLOGY REPORT*  Clinical Data:  Status post fall.  Concern for head or cervical spine injury.  CT HEAD WITHOUT CONTRAST AND CT CERVICAL SPINE WITHOUT CONTRAST  Technique:  Multidetector CT imaging of the head and cervical spine was performed following the standard protocol without intravenous contrast.  Multiplanar CT image reconstructions of the cervical spine were also generated.  Comparison: CT of the head the cervical spine performed 04/27/2010, and MRI of the brain performed 05/18/2013  CT HEAD  Findings: There is no evidence of acute infarction, mass lesion, or intra- or extra-axial hemorrhage on CT.  Prominence of the ventricles and sulci reflects mild cortical volume loss.  Scattered periventricular white matter change likely reflects small vessel ischemic microangiopathy.  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  The brainstem and fourth ventricle are within normal limits.  The basal ganglia are unremarkable in appearance.  The cerebral hemispheres demonstrate grossly normal gray-white differentiation. No mass effect or midline shift is seen.  There is no evidence of fracture; visualized osseous structures are unremarkable in appearance.  The orbits are within normal limits. The paranasal sinuses and mastoid air cells are well-aerated.  No significant soft tissue abnormalities are seen.  IMPRESSION:  1.  No evidence of traumatic intracranial injury or fracture. 2.  Mild cortical volume loss  and scattered small vessel ischemic microangiopathy. 3.  Chronic encephalomalacia at the high right parietal lobe likely reflects remote  infarct, stable from prior studies.  CT CERVICAL SPINE  Findings: There is no evidence of fracture or subluxation. Vertebral bodies demonstrate normal height and alignment.  There is mild narrowing of the intervertebral disc space at C3-C4.  There is chronic osseous fusion at C5-C7.  Small posterior disc osteophyte complexes are noted along the cervical spine.  Prevertebral soft tissues are within normal limits.  The visualized portions of the thyroid gland are unremarkable in appearance.  The visualized lung apices are clear.  Dense calcification is noted at the carotid bifurcations bilaterally.  IMPRESSION:  1.  No evidence of fracture or subluxation along the cervical spine. 2.  Minimal degenerative change along the cervical spine, with chronic osseous fusion at C5-C7. 3.  Dense calcification at the carotid bifurcations bilaterally. Carotid ultrasound would be helpful for further evaluation, on an elective non-emergent basis.   Original Report Authenticated By: Tonia Ghent, M.D.   Ct Cervical Spine Wo Contrast  06/08/2013   *RADIOLOGY REPORT*  Clinical Data:  Status post fall.  Concern for head or cervical spine injury.  CT HEAD WITHOUT CONTRAST AND CT CERVICAL SPINE WITHOUT CONTRAST  Technique:  Multidetector CT imaging of the head and cervical spine was performed following the standard protocol without intravenous contrast.  Multiplanar CT image reconstructions of the cervical spine were also generated.  Comparison: CT of the head the cervical spine performed 04/27/2010, and MRI of the brain performed 05/18/2013  CT HEAD  Findings: There is no evidence of acute infarction, mass lesion, or intra- or extra-axial hemorrhage on CT.  Prominence of the ventricles and sulci reflects mild cortical volume loss.  Scattered periventricular white matter change likely reflects small  vessel ischemic microangiopathy.  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  The brainstem and fourth ventricle are within normal limits.  The basal ganglia are unremarkable in appearance.  The cerebral hemispheres demonstrate grossly normal gray-white differentiation. No mass effect or midline shift is seen.  There is no evidence of fracture; visualized osseous structures are unremarkable in appearance.  The orbits are within normal limits. The paranasal sinuses and mastoid air cells are well-aerated.  No significant soft tissue abnormalities are seen.  IMPRESSION:  1.  No evidence of traumatic intracranial injury or fracture. 2.  Mild cortical volume loss and scattered small vessel ischemic microangiopathy. 3.  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  CT CERVICAL SPINE  Findings: There is no evidence of fracture or subluxation. Vertebral bodies demonstrate normal height and alignment.  There is mild narrowing of the intervertebral disc space at C3-C4.  There is chronic osseous fusion at C5-C7.  Small posterior disc osteophyte complexes are noted along the cervical spine.  Prevertebral soft tissues are within normal limits.  The visualized portions of the thyroid gland are unremarkable in appearance.  The visualized lung apices are clear.  Dense calcification is noted at the carotid bifurcations bilaterally.  IMPRESSION:  1.  No evidence of fracture or subluxation along the cervical spine. 2.  Minimal degenerative change along the cervical spine, with chronic osseous fusion at C5-C7. 3.  Dense calcification at the carotid bifurcations bilaterally. Carotid ultrasound would be helpful for further evaluation, on an elective non-emergent basis.   Original Report Authenticated By: Tonia Ghent, M.D.   Dg Chest Portable 1 View  06/08/2013   CLINICAL DATA:   History of trauma from a fall complaining chest  pain.  EXAM: PORTABLE CHEST - 1 VIEW  COMPARISON:  CHEST x-ray 04/07/2020 2014.  FINDINGS: New area of airspace consolidation in the left lower lobe partially obscuring the left hemidiaphragm. Probable small left pleural effusion. Right lung appears clear. Mild diffuse peribronchial cuffing. Heart size appears borderline enlarged. No evidence of pulmonary edema. Mediastinal contours are unremarkable. Atherosclerosis in the thoracic aorta. No definite pneumothorax. Visualized portions of the bony thorax appears grossly intact.  IMPRESSION: 1. In the setting of recent trauma, the appearance of the left lower lobe is concerning for potential contusion, however, this would be unusual for a typical fall from a standing or seated position. If the patient fell from a significant height, this warrants consideration. Alternatively, this may reflect sequela of aspiration associated with the trauma, or could simply indicate an underlying infection that pre-existed the prior trauma. Clinical correlation is recommended. There is a small left pleural effusion. 2. No pneumothorax or definite rib fractures identified. 3. Atherosclerosis.   Electronically Signed   By: Trudie Reed M.D.   On: 06/08/2013 20:36     Other results: EKG: Sinus rhythm, normal intervals, normal axis, left atrial enlargement, no LVH, early repolarization V2-4 (old), no ST or T wave changes; unchanged from prior   Assessment & Plan by Problem: #Syncope/fall- likely post-micturition syncope, pt down for ~12 hours before being found at assisted living facility.  CT head negative for intracranial injury or fracture. CT cervical spine negative for fracture or subluxation but did show dense calcification at carotid bifurcations bilaterally (recommendation for carotid dopplers).  Pt was having hip pain after fall but pelvis xray showed no fracture or dislocation.  CK 1281 so low concern for rhabdomyolysis,  lactic acid 1.3.  Echo in 2012 showed EF 55-60% with grade I diastolic dysfunction and mild to moderate AS.  Will order repeat echo to re-assess aortic stenosis.  -admit to IMTS telemetry -repeat echo -carotid dopplers per radiology recs -PT/OT consult  #HCAP- Pt afebrile, VSS.  No increased cough from baseline, no sputum production though pt does endorse difficulty/choking with swallowing so high aspiration risk.  WBC 14.3, crackles at left base, diffuse expiratory wheezing on exam; CXR showed left lower lobe infiltrate. Will treat for HCAP (pt recently hospitalized in 04/2013). CURB 65 score 1 (low risk).  -NPO pending swallow study -vancomycin per pharmacy, cefepime 1 g IV q8h -albuterol and ipratropium nebulizers q6h prn wheezing -Strep pneumo and Legionella urinary antigens pending -sputum culture if possible -CBC, CMP, HIV in AM  #Elevated troponin- Troponin 1.3 in ED, 1.56 on repeat.  No chest pain, no ischemic changes on EKG.  Will not intiate heparin for now given that pt is taking Xarelto (DVT in 12/2012?) and Brilinta (new medication since 07/2011 admission, likely after stent placement in interim period but unclear why changed from Plavix).  Of note, at hospitalization in 2012, troponin 1.34 on admission and trended down.   -cardiology consulted- recommended to continue medical therapy (Brilinta, ASA, statin) and obtain records from [place] (especially from 12/2012) -cycle troponins -repeat EKG in AM  #CHF- no concern for acute exacerbation as pt does not appear volume overloaded. Unilateral crackles at left base likely due to PNA per above, trace to 1+ pitting edema to mid-shins bilaterally on exam.  CXR without edema.  -continue medical management with home meds (metoprolol, losartan, Ranolaxine for CAD, Atorvastatin) -daily weights, Is and Os -TED hose  #COPD- stable. No increase in baseline shortness of breath and cough, no sputum production.  COPD  seems well controlled on inhalers,  pt does not smoke and not on home O2 (since 02/2013 when discontinued by [place]). Will order albuterol and Ipratropium nebulizers given expiratory wheezing on exam. No systemic steroids at this time.  -albuterol and ipratropium Nebulizers q6h prn wheezing  #DM2- followed at [facility] so no recent A1C in record (and pt does not know value).  He is on metformin 500 mg BID at home, CBGs usually in 130s, BMP glucose 135.  No recent episodes of hypo/hyperglycemia. Will continue home gabapentin for peripheral neuropathy.  -CBGs q4h  -SSI sensitive -A1C, lipid panel in AM  #HTN- stable. BPs 130s-140s/50s-70s. Continue metoprolol, losartan per above.  #DVT PPX- Xarelto per above  #Code status- DNR    Dispo: Disposition is deferred at this time, awaiting improvement of current medical problems. Anticipated discharge in approximately 2-3 day(s).   The patient does have a current PCP [facility], MD) and does need an [facility] hospital follow-up appointment after discharge.   Signed: Rocco Serene, MD 06/09/2013, 2:08 AM

## 2013-06-08 NOTE — ED Notes (Signed)
Admitting MDs at BS 

## 2013-06-08 NOTE — ED Notes (Signed)
Critical troponin reported to Dr.Yao

## 2013-06-08 NOTE — ED Notes (Signed)
To ED from [facility] via EMS, pt fell and was on floor for 24-36 hours, denies neck/back pain, VSS, CBG 134, no complaints, does not remember falling, is awake oriented on arrival, NAD

## 2013-06-09 ENCOUNTER — Encounter (HOSPITAL_COMMUNITY): Payer: Self-pay

## 2013-06-09 DIAGNOSIS — R55 Syncope and collapse: Secondary | ICD-10-CM

## 2013-06-09 DIAGNOSIS — R7989 Other specified abnormal findings of blood chemistry: Secondary | ICD-10-CM

## 2013-06-09 DIAGNOSIS — I319 Disease of pericardium, unspecified: Secondary | ICD-10-CM

## 2013-06-09 LAB — LIPID PANEL
Cholesterol: 179 mg/dL (ref 0–200)
HDL: 55 mg/dL (ref >39)
LDL Cholesterol: 105 mg/dL — ABNORMAL HIGH (ref 0–99)
Total CHOL/HDL Ratio: 3.3 RATIO
Triglycerides: 93 mg/dL (ref <150)
VLDL: 19 mg/dL (ref 0–40)

## 2013-06-09 LAB — TROPONIN I
Troponin I: 1.56 ng/mL (ref <0.30)
Troponin I: 0.99 ng/mL (ref <0.30)

## 2013-06-09 LAB — CBC
HCT: 25.8 % — ABNORMAL LOW (ref 39.0–52.0)
HCT: 27.6 % — ABNORMAL LOW (ref 39.0–52.0)
Hemoglobin: 9.4 g/dL — ABNORMAL LOW (ref 13.0–17.0)
Hemoglobin: 9.8 g/dL — ABNORMAL LOW (ref 13.0–17.0)
MCH: 29.4 pg (ref 26.0–34.0)
MCH: 28.6 pg (ref 26.0–34.0)
MCHC: 36.4 g/dL — ABNORMAL HIGH (ref 30.0–36.0)
MCHC: 35.5 g/dL (ref 30.0–36.0)
MCV: 80.6 fL (ref 78.0–100.0)
MCV: 80.5 fL (ref 78.0–100.0)
Platelets: 131 10*3/uL — ABNORMAL LOW (ref 150–400)
Platelets: 137 10*3/uL — ABNORMAL LOW (ref 150–400)
RBC: 3.2 MIL/uL — ABNORMAL LOW (ref 4.22–5.81)
RBC: 3.43 MIL/uL — ABNORMAL LOW (ref 4.22–5.81)
RDW: 15.5 % (ref 11.5–15.5)
RDW: 15.4 % (ref 11.5–15.5)
WBC: 12 10*3/uL — ABNORMAL HIGH (ref 4.0–10.5)
WBC: 11.7 10*3/uL — ABNORMAL HIGH (ref 4.0–10.5)

## 2013-06-09 LAB — COMPREHENSIVE METABOLIC PANEL
ALT: 20 U/L (ref 0–53)
AST: 36 U/L (ref 0–37)
Albumin: 2.6 g/dL — ABNORMAL LOW (ref 3.5–5.2)
Alkaline Phosphatase: 63 U/L (ref 39–117)
BUN: 11 mg/dL (ref 6–23)
CO2: 26 mEq/L (ref 19–32)
Calcium: 9.1 mg/dL (ref 8.4–10.5)
Chloride: 92 mEq/L — ABNORMAL LOW (ref 96–112)
Creatinine, Ser: 0.73 mg/dL (ref 0.50–1.35)
GFR calc Af Amer: >90 mL/min (ref >90)
GFR calc non Af Amer: 83 mL/min — ABNORMAL LOW (ref >90)
Glucose, Bld: 131 mg/dL — ABNORMAL HIGH (ref 70–99)
Potassium: 3.6 mEq/L (ref 3.5–5.1)
Sodium: 126 mEq/L — ABNORMAL LOW (ref 135–145)
Total Bilirubin: 0.9 mg/dL (ref 0.3–1.2)
Total Protein: 5.6 g/dL — ABNORMAL LOW (ref 6.0–8.3)

## 2013-06-09 LAB — GLUCOSE, CAPILLARY
Glucose-Capillary: 148 mg/dL — ABNORMAL HIGH (ref 70–99)
Glucose-Capillary: 135 mg/dL — ABNORMAL HIGH (ref 70–99)
Glucose-Capillary: 123 mg/dL — ABNORMAL HIGH (ref 70–99)
Glucose-Capillary: 146 mg/dL — ABNORMAL HIGH (ref 70–99)

## 2013-06-09 LAB — HIV ANTIBODY (ROUTINE TESTING W REFLEX): HIV: NONREACTIVE

## 2013-06-09 LAB — STREP PNEUMONIAE URINARY ANTIGEN: Strep Pneumo Urinary Antigen: NEGATIVE

## 2013-06-09 LAB — HEMOGLOBIN A1C: Mean Plasma Glucose: 140 mg/dL — ABNORMAL HIGH (ref <117)

## 2013-06-09 LAB — CG4 I-STAT (LACTIC ACID): Lactic Acid, Venous: 1.32 mmol/L (ref 0.5–2.2)

## 2013-06-09 MED ORDER — INSULIN ASPART 100 UNIT/ML ~~LOC~~ SOLN
0.0000 [IU] | Freq: Three times a day (TID) | SUBCUTANEOUS | Status: DC
  Administered 2013-06-09 – 2013-06-10 (×4): 1 [IU] via SUBCUTANEOUS
  Administered 2013-06-10 – 2013-06-11 (×3): 2 [IU] via SUBCUTANEOUS
  Administered 2013-06-11: 1 [IU] via SUBCUTANEOUS

## 2013-06-09 MED ORDER — LATANOPROST 0.005 % OP SOLN
1.0000 [drp] | Freq: Every day | OPHTHALMIC | Status: DC
  Administered 2013-06-09 – 2013-06-10 (×2): 1 [drp] via OPHTHALMIC
  Filled 2013-06-09: qty 2.5

## 2013-06-09 MED ORDER — PIPERACILLIN-TAZOBACTAM 3.375 G IVPB
3.3750 g | Freq: Three times a day (TID) | INTRAVENOUS | Status: DC
  Administered 2013-06-09 – 2013-06-11 (×7): 3.375 g via INTRAVENOUS
  Filled 2013-06-09 (×9): qty 50

## 2013-06-09 MED ORDER — ATORVASTATIN CALCIUM 80 MG PO TABS
80.0000 mg | ORAL_TABLET | Freq: Every day | ORAL | Status: DC
Start: 2013-06-09 — End: 2013-06-11
  Administered 2013-06-09 – 2013-06-10 (×2): 80 mg via ORAL
  Filled 2013-06-09 (×3): qty 1

## 2013-06-09 MED ORDER — VANCOMYCIN HCL IN DEXTROSE 1-5 GM/200ML-% IV SOLN
1000.0000 mg | Freq: Two times a day (BID) | INTRAVENOUS | Status: DC
  Administered 2013-06-09 – 2013-06-11 (×5): 1000 mg via INTRAVENOUS
  Filled 2013-06-09 (×6): qty 200

## 2013-06-09 MED ORDER — INFLUENZA VAC SPLIT QUAD 0.5 ML IM SUSP
0.5000 mL | INTRAMUSCULAR | Status: AC
  Administered 2013-06-10: 0.5 mL via INTRAMUSCULAR
  Filled 2013-06-09: qty 0.5

## 2013-06-09 MED ORDER — RANOLAZINE ER 500 MG PO TB12
500.0000 mg | ORAL_TABLET | Freq: Two times a day (BID) | ORAL | Status: DC
  Administered 2013-06-09 – 2013-06-11 (×6): 500 mg via ORAL
  Filled 2013-06-09 (×7): qty 1

## 2013-06-09 MED ORDER — FLUTICASONE PROPIONATE HFA 44 MCG/ACT IN AERO
2.0000 | INHALATION_SPRAY | Freq: Two times a day (BID) | RESPIRATORY_TRACT | Status: DC
  Administered 2013-06-09 – 2013-06-11 (×4): 2 via RESPIRATORY_TRACT
  Filled 2013-06-09: qty 10.6

## 2013-06-09 MED ORDER — DEXTROSE 5 % IV SOLN
1.0000 g | Freq: Three times a day (TID) | INTRAVENOUS | Status: DC
  Administered 2013-06-09: 1 g via INTRAVENOUS
  Filled 2013-06-09 (×2): qty 1

## 2013-06-09 MED ORDER — NITROGLYCERIN 0.4 MG SL SUBL: 0.4000 mg | SUBLINGUAL_TABLET | SUBLINGUAL | Status: DC | PRN

## 2013-06-09 MED ORDER — ASPIRIN 81 MG PO CHEW
81.0000 mg | CHEWABLE_TABLET | Freq: Every day | ORAL | Status: DC
  Administered 2013-06-09 – 2013-06-11 (×3): 81 mg via ORAL
  Filled 2013-06-09 (×5): qty 1

## 2013-06-09 MED ORDER — ALBUTEROL SULFATE (5 MG/ML) 0.5% IN NEBU: 2.5000 mg | INHALATION_SOLUTION | Freq: Four times a day (QID) | RESPIRATORY_TRACT | Status: DC | PRN

## 2013-06-09 MED ORDER — ALBUTEROL SULFATE HFA 108 (90 BASE) MCG/ACT IN AERS
2.0000 | INHALATION_SPRAY | Freq: Four times a day (QID) | RESPIRATORY_TRACT | Status: DC | PRN
  Filled 2013-06-09: qty 6.7

## 2013-06-09 MED ORDER — TICAGRELOR 90 MG PO TABS
90.0000 mg | ORAL_TABLET | Freq: Two times a day (BID) | ORAL | Status: DC
Start: 2013-06-09 — End: 2013-06-11
  Administered 2013-06-09 – 2013-06-11 (×6): 90 mg via ORAL
  Filled 2013-06-09 (×7): qty 1

## 2013-06-09 MED ORDER — IPRATROPIUM BROMIDE 0.02 % IN SOLN: 0.5000 mg | Freq: Four times a day (QID) | RESPIRATORY_TRACT | Status: DC | PRN

## 2013-06-09 MED ORDER — POLYVINYL ALCOHOL 1.4 % OP SOLN
1.0000 [drp] | OPHTHALMIC | Status: DC | PRN
  Filled 2013-06-09: qty 15

## 2013-06-09 MED ORDER — SODIUM CHLORIDE 0.9 % IV SOLN
INTRAVENOUS | Status: DC
  Administered 2013-06-09: 08:00:00 via INTRAVENOUS
  Administered 2013-06-10 – 2013-06-11 (×2): 50 mL via INTRAVENOUS

## 2013-06-09 MED ORDER — TAMSULOSIN HCL 0.4 MG PO CAPS
0.4000 mg | ORAL_CAPSULE | Freq: Every day | ORAL | Status: DC
  Administered 2013-06-09 – 2013-06-10 (×2): 0.4 mg via ORAL
  Filled 2013-06-09 (×3): qty 1

## 2013-06-09 MED ORDER — GABAPENTIN 300 MG PO CAPS
300.0000 mg | ORAL_CAPSULE | Freq: Every day | ORAL | Status: DC
  Administered 2013-06-09 – 2013-06-10 (×2): 300 mg via ORAL
  Filled 2013-06-09 (×3): qty 1

## 2013-06-09 MED ORDER — MORPHINE SULFATE 2 MG/ML IJ SOLN: 1.0000 mg | INTRAMUSCULAR | Status: DC | PRN

## 2013-06-09 MED ORDER — METOPROLOL TARTRATE 25 MG PO TABS
25.0000 mg | ORAL_TABLET | Freq: Two times a day (BID) | ORAL | Status: DC
  Administered 2013-06-09 – 2013-06-11 (×6): 25 mg via ORAL
  Filled 2013-06-09 (×6): qty 1

## 2013-06-09 MED ORDER — PANTOPRAZOLE SODIUM 40 MG PO TBEC
40.0000 mg | DELAYED_RELEASE_TABLET | Freq: Every day | ORAL | Status: DC
  Administered 2013-06-09 – 2013-06-11 (×3): 40 mg via ORAL
  Filled 2013-06-09 (×3): qty 1

## 2013-06-09 MED ORDER — RIVAROXABAN 20 MG PO TABS
20.0000 mg | ORAL_TABLET | Freq: Every day | ORAL | Status: DC
  Administered 2013-06-09 – 2013-06-10 (×3): 20 mg via ORAL
  Filled 2013-06-09 (×4): qty 1

## 2013-06-09 NOTE — ED Notes (Signed)
Report called to 2H, ready for pt, family x2 at [facility], updated, pt alert, NAD< calm, interactive, VSS. Denies pain or other complaints.

## 2013-06-09 NOTE — Progress Notes (Signed)
*  PRELIMINARY RESULTS* Vascular Ultrasound Carotid Duplex (Doppler) has been completed.  Preliminary findings: Bilateral:  1-39% ICA stenosis.  Vertebral artery flow is antegrade.     Farrel Demark, RDMS, RVT  06/09/2013, 12:46 PM

## 2013-06-09 NOTE — Progress Notes (Signed)
Subjective: Patient seen at the bedside. He is in good spirits. Denies chest pain, shortness of breath, cough, nausea, vomiting, diarrhea, abdominal pain. He says after he passed out yesterday he was unable to reach the call bell or his cell phone. He says this has happened occasionally before, where he wakes up and can't remember what happened. He says last time it happened, it may have had to do with his "low sodium". He denies confusion and disorientation presently. He cannot tell us much about his cardiac history other than having a problem with his aortic valve.   Objective: Vital signs in last 24 hours: Filed Vitals:   06/09/13 0400 06/09/13 0500 06/09/13 0741 06/09/13 0932  BP: 147/47     Pulse: 83     Temp:   98.8 F (37.1 C)   TempSrc:   Oral   Resp: 20     Height:      Weight:  159 lb 2.8 oz (72.2 kg)    SpO2: 93%   96%   Weight change:   Intake/Output Summary (Last 24 hours) at 06/09/13 1011 Last data filed at 06/09/13 0900  Gross per 24 hour  Intake     85 ml  Output    650 ml  Net   -565 ml   Physical Exam:  General: alert, cooperative, and in no apparent distress HEENT: NCAT, vision grossly intact, oropharynx clear and non-erythematous, dry mucous membranes Neck: supple, no lymphadenopathy Lungs: breathing comfortably, crackles at left base, no rhonchi Heart: regular rate and rhythm, 3-4 crescendo-descresendo murmur best heard at LUSB, radiating to carotids, no rubs or gallops Abdomen: soft, non-tender, non-distended, normal bowel sounds  Extremities: 1+ pitting edema to mid shins bilaterally, 2+ DP pulses bilaterally, no cyanosis, clubbing, TEDs in place Neurologic: Alert & oriented X3, cranial nerves II-XII intact, strength grossly intact, sensation intact to light touch  Lab Results: Basic Metabolic Panel:  Recent Labs Lab 06/08/13 2016 06/09/13 0555  NA 127* 126*  K 4.2 3.6  CL 91* 92*  CO2 26 26  GLUCOSE 135* 131*  BUN 14 11  CREATININE 0.77  0.73  CALCIUM 9.5 9.1   Liver Function Tests:  Recent Labs Lab 06/08/13 2016 06/09/13 0555  AST 52* 36  ALT 22 20  ALKPHOS 54 63  BILITOT 1.0 0.9  PROT 5.9* 5.6*  ALBUMIN 2.7* 2.6*   CBC:  Recent Labs Lab 06/08/13 2016 06/09/13 0555  WBC 14.3* 12.0*  NEUTROABS 12.5*  --   HGB 10.3* 9.4*  HCT 28.5* 25.8*  MCV 80.5 80.6  PLT 140* 131*   Cardiac Enzymes:  Recent Labs Lab 06/08/13 2016 06/09/13 0015 06/09/13 0600  CKTOTAL 1281*  --   --   CKMB 10.7*  --   --   TROPONINI  --  1.56* 1.08*   BNP:  Recent Labs Lab 06/08/13 2226  PROBNP 4358.0*   CBG:  Recent Labs Lab 06/09/13 0058 06/09/13 0742  GLUCAP 148* 135*   Fasting Lipid Panel:  Recent Labs Lab 06/09/13 0600  CHOL 179  HDL 55  LDLCALC 105*  TRIG 93  CHOLHDL 3.3   Coagulation:  Recent Labs Lab 06/08/13 2225  LABPROT 31.1*  INR 3.14*   Urinalysis:  Recent Labs Lab 06/08/13 2232  COLORURINE YELLOW  LABSPEC 1.017  PHURINE 7.5  GLUCOSEU NEGATIVE  HGBUR NEGATIVE  BILIRUBINUR NEGATIVE  KETONESUR NEGATIVE  PROTEINUR 30*  UROBILINOGEN 1.0  NITRITE NEGATIVE  LEUKOCYTESUR NEGATIVE    Micro Results: Recent Results (from  the past 240 hour(s))  MRSA PCR SCREENING     Status: None   Collection Time    06/09/13  1:02 AM      Result Value Range Status   MRSA by PCR NEGATIVE  NEGATIVE Final   Comment:            The GeneXpert MRSA Assay (FDA     approved for NASAL specimens     only), is one component of a     comprehensive MRSA colonization     surveillance program. It is not     intended to diagnose MRSA     infection nor to guide or     monitor treatment for     MRSA infections.   Studies/Results: Dg Pelvis 1-2 Views  06/08/2013   CLINICAL DATA:  Larey Seat. Weakness.  EXAM: PELVIS - 1-2 VIEW  COMPARISON:  Abdomen radiographs dated 09/30/2010.  FINDINGS: No fracture dislocation. Arterial calcifications. Bilateral common iliac stents.  IMPRESSION: No fracture or dislocation.    Electronically Signed   By: Gordan Payment M.D.   On: 06/08/2013 21:39   Ct Head Wo Contrast  06/08/2013   *RADIOLOGY REPORT*  Clinical Data:  Status post fall.  Concern for head or cervical spine injury.  CT HEAD WITHOUT CONTRAST AND CT CERVICAL SPINE WITHOUT CONTRAST  Technique:  Multidetector CT imaging of the head and cervical spine was performed following the standard protocol without intravenous contrast.  Multiplanar CT image reconstructions of the cervical spine were also generated.  Comparison: CT of the head the cervical spine performed 04/27/2010, and MRI of the brain performed 05/18/2013  CT HEAD  Findings: There is no evidence of acute infarction, mass lesion, or intra- or extra-axial hemorrhage on CT.  Prominence of the ventricles and sulci reflects mild cortical volume loss.  Scattered periventricular white matter change likely reflects small vessel ischemic microangiopathy.  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  The brainstem and fourth ventricle are within normal limits.  The basal ganglia are unremarkable in appearance.  The cerebral hemispheres demonstrate grossly normal gray-white differentiation. No mass effect or midline shift is seen.  There is no evidence of fracture; visualized osseous structures are unremarkable in appearance.  The orbits are within normal limits. The paranasal sinuses and mastoid air cells are well-aerated.  No significant soft tissue abnormalities are seen.  IMPRESSION:  1.  No evidence of traumatic intracranial injury or fracture. 2.  Mild cortical volume loss and scattered small vessel ischemic microangiopathy. 3.  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  CT CERVICAL SPINE  Findings: There is no evidence of fracture or subluxation. Vertebral bodies demonstrate normal height and alignment.  There is mild  narrowing of the intervertebral disc space at C3-C4.  There is chronic osseous fusion at C5-C7.  Small posterior disc osteophyte complexes are noted along the cervical spine.  Prevertebral soft tissues are within normal limits.  The visualized portions of the thyroid gland are unremarkable in appearance.  The visualized lung apices are clear.  Dense calcification is noted at the carotid bifurcations bilaterally.  IMPRESSION:  1.  No evidence of fracture or subluxation along the cervical spine. 2.  Minimal degenerative change along the cervical spine, with chronic osseous fusion at C5-C7. 3.  Dense calcification at the carotid bifurcations bilaterally. Carotid ultrasound would be helpful for further evaluation, on an elective non-emergent basis.  Original Report Authenticated By: Tonia Ghent, M.D.   Ct Cervical Spine Wo Contrast  06/08/2013   *RADIOLOGY REPORT*  Clinical Data:  Status post fall.  Concern for head or cervical spine injury.  CT HEAD WITHOUT CONTRAST AND CT CERVICAL SPINE WITHOUT CONTRAST  Technique:  Multidetector CT imaging of the head and cervical spine was performed following the standard protocol without intravenous contrast.  Multiplanar CT image reconstructions of the cervical spine were also generated.  Comparison: CT of the head the cervical spine performed 04/27/2010, and MRI of the brain performed 05/18/2013  CT HEAD  Findings: There is no evidence of acute infarction, mass lesion, or intra- or extra-axial hemorrhage on CT.  Prominence of the ventricles and sulci reflects mild cortical volume loss.  Scattered periventricular white matter change likely reflects small vessel ischemic microangiopathy.  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  The brainstem and fourth ventricle are within normal limits.  The basal ganglia are unremarkable in appearance.  The cerebral hemispheres demonstrate grossly  normal gray-white differentiation. No mass effect or midline shift is seen.  There is no evidence of fracture; visualized osseous structures are unremarkable in appearance.  The orbits are within normal limits. The paranasal sinuses and mastoid air cells are well-aerated.  No significant soft tissue abnormalities are seen.  IMPRESSION:  1.  No evidence of traumatic intracranial injury or fracture. 2.  Mild cortical volume loss and scattered small vessel ischemic microangiopathy. 3.  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  CT CERVICAL SPINE  Findings: There is no evidence of fracture or subluxation. Vertebral bodies demonstrate normal height and alignment.  There is mild narrowing of the intervertebral disc space at C3-C4.  There is chronic osseous fusion at C5-C7.  Small posterior disc osteophyte complexes are noted along the cervical spine.  Prevertebral soft tissues are within normal limits.  The visualized portions of the thyroid gland are unremarkable in appearance.  The visualized lung apices are clear.  Dense calcification is noted at the carotid bifurcations bilaterally.  IMPRESSION:  1.  No evidence of fracture or subluxation along the cervical spine. 2.  Minimal degenerative change along the cervical spine, with chronic osseous fusion at C5-C7. 3.  Dense calcification at the carotid bifurcations bilaterally. Carotid ultrasound would be helpful for further evaluation, on an elective non-emergent basis.   Original Report Authenticated By: Tonia Ghent, M.D.   Dg Chest Portable 1 View  06/08/2013   CLINICAL DATA:  History of trauma from a fall complaining chest pain.  EXAM: PORTABLE CHEST - 1 VIEW  COMPARISON:  CHEST x-ray 04/07/2020 2014.  FINDINGS: New area of airspace consolidation in the left lower lobe partially obscuring the left hemidiaphragm. Probable small left pleural effusion. Right lung appears clear. Mild diffuse  peribronchial cuffing. Heart size appears borderline enlarged. No evidence of pulmonary edema. Mediastinal contours are unremarkable. Atherosclerosis in the thoracic aorta. No definite pneumothorax. Visualized portions of the bony thorax appears grossly intact.  IMPRESSION: 1. In the setting of recent trauma, the appearance of the left lower lobe is concerning for potential contusion, however, this would be unusual for a typical fall from a standing or seated position. If the patient fell from a significant height, this warrants consideration. Alternatively, this may reflect sequela of aspiration associated with the trauma, or could simply indicate an underlying infection that pre-existed the prior trauma. Clinical correlation is  recommended. There is a small left pleural effusion. 2. No pneumothorax or definite rib fractures identified. 3. Atherosclerosis.   Electronically Signed   By: Trudie Reed M.D.   On: 06/08/2013 20:36   Medications: I have reviewed the patient's current medications. Scheduled Meds: . aspirin  81 mg Oral Daily  . atorvastatin  80 mg Oral QHS  . fluticasone  2 puff Inhalation BID  . gabapentin  300 mg Oral QHS  . [START ON 06/10/2013] influenza vac split quadrivalent PF  0.5 mL Intramuscular Tomorrow-1000  . insulin aspart  0-9 Units Subcutaneous TID WC  . latanoprost  1 drop Both Eyes QHS  . metoprolol  25 mg Oral BID  . pantoprazole  40 mg Oral Daily  . piperacillin-tazobactam  3.375 g Intravenous Q8H  . ranolazine  500 mg Oral BID  . Rivaroxaban  20 mg Oral Q supper  . tamsulosin  0.4 mg Oral QPC supper  . Ticagrelor  90 mg Oral BID  . vancomycin  1,000 mg Intravenous Q12H   Continuous Infusions: . sodium chloride 50 mL/hr at 06/09/13 0900   PRN Meds:.albuterol, albuterol, ipratropium, morphine injection, nitroGLYCERIN, polyvinyl alcohol  Assessment/Plan:  #Syncope/fall - Unclear etiology. Could represent post-micturition syncope as occurred in the restroom.  Pt down for ~12 hours before being found at assisted living facility. CT head negative for intracranial injury or fracture. CT cervical spine negative for fracture or subluxation but did show dense calcification at carotid bifurcations bilaterally (recommendation for carotid dopplers). Pt was having hip pain after fall, but pelvis xray showed no fracture or dislocation. CK 1281 so low concern for rhabdomyolysis, lactic acid 1.3. Aortic stenosis is another possible etiology for his syncope as patient has notable systolic murmur heard loudest at LUSB and radiating to carotids. Echo in 2012 showed EF 55-60% with grade I diastolic dysfunction and mild to moderate AS.  - Follow up repeat echo to re-assess aortic stenosis.  - Continue telemetry  - Follow up carotid dopplers per radiology recs  - PT/OT consult, suspect patient may need rehab stint  #HCAP - Pt afebrile, VSS. No increased cough from baseline, no sputum production though pt does endorse difficulty/choking with swallowing so high aspiration risk. Given his long lying time on the floor, we might not necessarily see the classic RML aspiration infiltrate. WBC 14.3, crackles at left base; CXR showed left lower lobe infiltrate. Treating for HCAP (pt recently hospitalized in 04/2013). CURB 65 score 1 (low risk).  - NPO except for ice chips pending SLP evaluation - Vancomycin per pharmacy, Zosyn per pharmacy to cover for possible aspiration pneumonia - Albuterol and ipratropium nebulizers q6h prn wheezing - Strep pneumo and Legionella urinary antigens pending - Sputum culture if possible  - Follow up HIV - CBC, CMP in AM   #Mildly elevated troponins - Troponin 1.3 in ED>1.56>1.08. No chest pain, no ischemic changes on EKG on admission or this morning. Will not intiate heparin for now since taking Xarelto (DVT in 12/2012?) and Brilinta (new medication since 07/2011 admission, likely after stent placement in interim period but unclear why changed from  Plavix). Of note, at hospitalization in 2012, troponin 1.34 on admission and trended down.  - Cardiology consulted, appreciate recs  - Follow up echo - Will continue medical therapy (Brilinta, ASA, statin) - Will obtain records from [place] (especially from 12/2012)   #CHF - No concern for acute exacerbation as pt does not appear volume overloaded. If anything, his exam is suggestive of dehydration. Unilateral  crackles at left base likely due to PNA per above, trace to 1+ pitting edema to mid-shins bilaterally on exam. CXR without edema.  - IVF NS @50cc /hr x 5 hours - Continue medical management with home meds (metoprolol, losartan, Ranolaxine for CAD, Atorvastatin)  - Daily weights, Is and Os  - TED hose   #COPD - Stable. No increase in baseline shortness of breath and cough, no sputum production. COPD seems well controlled on inhalers, pt does not smoke and not on home O2 (since 02/2013 when discontinued by [place]). No systemic steroids are indicated at this time.  - Albuterol and ipratropium Nebulizers q6h prn wheezing   #DM2 - Followed at [facility] so no recent A1C in record (and pt does not know value). He is on metformin 500 mg BID at home, CBGs usually in 130s, BMP glucose 135. No recent episodes of hypo/hyperglycemia. Lipid panel wnl, below. - CBGs q4h  - SSI sensitive  - Follow up A1C - Will continue home gabapentin for peripheral neuropathy  Lipid Panel     Component Value Date/Time   CHOL 179 06/09/2013 0600   TRIG 93 06/09/2013 0600   HDL 55 06/09/2013 0600   CHOLHDL 3.3 06/09/2013 0600   VLDL 19 06/09/2013 0600   LDLCALC 105* 06/09/2013 0600    #HTN - Stable. BPs 130-140s/50-70s.  - Continue metoprolol, losartan per above  #Hyponatremia - Chronic, at baseline (126-128). Likely due to fluid overload from [facility]. - Continue to monitor - Will obtain records from [place] re: possible syncope admission 2/2 hyponatremia  #DVT PPX - On anticoagulation with Xarelto per above, no heparin or Lovenox  indicated.  #Code status - DNR.   Dispo: Disposition is deferred at this time, awaiting improvement of current medical problems.  Anticipated discharge in approximately 1-3 day(s).   The patient does have a current PCP [facility], MD) and does need an [facility] hospital follow-up appointment after discharge.  The patient does not have transportation limitations that hinder transportation to clinic appointments.  .Services Needed at time of discharge: Y = Yes, Blank = No PT:   OT:   RN:   Equipment:   Other:     LOS: 1 day   Vivi Barrack, MD 06/09/2013, 10:11 AM

## 2013-06-09 NOTE — Consult Note (Signed)
PHARMACY CONSULT NOTE - INITIAL  Pharmacy Consult for :   Zosyn, Vancomycin Indication:  Asp PNA  Hospital Problems: Principal Problem:   Syncope and collapse Active Problems:   CAD (coronary artery disease)   COPD (chronic obstructive pulmonary disease)   Hyponatremia   Anemia   Diabetes mellitus   Hyperlipidemia   Diastolic CHF   DVT (deep venous thrombosis)   Elevated troponin   HCAP (healthcare-associated pneumonia)   Allergies: Allergies  Allergen Reactions  . Iodine Shortness Of Breath  . Iohexol      Code: SOB, Desc: ivp dye, iodine, Onset Date: [id number]     Patient Measurements: Height: 5\' 9"  (175.3 cm) Weight: 159 lb 2.8 oz (72.2 kg) IBW/kg (Calculated) : 70.7  Vital Signs: BP 147/47  Pulse 83  Temp(Src) 98.8 F (37.1 C) (Oral)  Resp 20  Ht 5\' 9"  (1.753 m)  Wt 159 lb 2.8 oz (72.2 kg)  BMI 23.49 kg/m2  SpO2 93%  Labs:  Recent Labs  06/08/13 2016 06/09/13 0555  WBC 14.3* 12.0*  HGB 10.3* 9.4*  PLT 140* 131*  CREATININE 0.77 0.73   Estimated Creatinine Clearance: 70 ml/min (by C-G formula based on Cr of 0.73).   Microbiology: Recent Results (from the past 720 hour(s))  MRSA PCR SCREENING     Status: None   Collection Time    05/18/13  6:50 PM      Result Value Range Status   MRSA by PCR NEGATIVE  NEGATIVE Final   Comment:            The GeneXpert MRSA Assay (FDA     approved for NASAL specimens     only), is one component of a     comprehensive MRSA colonization     surveillance program. It is not     intended to diagnose MRSA     infection nor to guide or     monitor treatment for     MRSA infections.  MRSA PCR SCREENING     Status: None   Collection Time    06/09/13  1:02 AM      Result Value Range Status   MRSA by PCR NEGATIVE  NEGATIVE Final   Comment:            The GeneXpert MRSA Assay (FDA     approved for NASAL specimens     only), is one component of a     comprehensive MRSA colonization     surveillance program.  It is not     intended to diagnose MRSA     infection nor to guide or     monitor treatment for     MRSA infections.    Medical/Surgical History: Past Medical History  Diagnosis Date  . Aortic stenosis   . High cholesterol   . COPD (chronic obstructive pulmonary disease)   . Retinal artery occlusion   . Emphysema   . GERD (gastroesophageal reflux disease)   . Coronary artery disease   . Heart murmur   . On home oxygen therapy     "2L all the time" (01/02/2013)  . Angina   . Hypertension   . CHF (congestive heart failure)   . MI (myocardial infarction)     "I've had several; last one was 03/09/2012" (01/02/2013  . Asthma   . Pneumonia     "today; have had it at least twice before" (01/02/2013)  . Shortness of breath     "most of the time recently" (  01/02/2013)  . Chronic bronchitis   . Type II diabetes mellitus   . History of stomach ulcers     "have them now; they flare up when I eat the wrong thing or don't eat" (01/02/2013)  . Hepatitis     "don't know which one" (01/02/2013)  . WGNFAOZH(086.5)     "probably weekly" (01/02/2013)  . Stroke     "several; left ankle/foot still weak from one of them" (01/02/2013)  . Arthritis     "all over" (01/02/2013)  . DJD (degenerative joint disease)     "all over" (01/02/2013)  . Chronic lower back pain   . Urinary urgency   . Prostate cancer     "had radiation treatments" (01/02/2013)  . Basal cell carcinoma     "left nose, behind right knee" (01/02/2013)  . Anemia     chronic/notes 01/02/2013   Past Surgical History  Procedure Laterality Date  . Shoulder arthroscopy w/ rotator cuff repair Right 1990's; 2000's    "they've operated on it twice" (01/02/2013)  . Sp pta peripheral      Bilateral leg stents  . Inguinal hernia repair Bilateral     "1 wk apart" (01/02/2013)  . Coronary stent placement      "I've had 7 or 8 stents put in my heart" (01/02/2013)  . Cardiac catheterization      "couldn't get new type of stents put in" (01/02/2013)  . Coronary  angioplasty with stent placement      "I've had 7 or 8 stents put in my heart; last stents put in ~ 2 wk ago" (01/02/2013)  . Back surgery    . Anterior fusion cervical spine      "C5-7; took bone off my right hip" (5-01/2013)    Medications:  Prescriptions prior to admission  Medication Sig Dispense Refill  . acetaminophen (TYLENOL) 500 MG tablet Take 500 mg by mouth every 6 (six) hours as needed for pain.      Marland Kitchen albuterol (PROVENTIL HFA;VENTOLIN HFA) 108 (90 BASE) MCG/ACT inhaler Inhale 2 puffs into the lungs every 6 (six) hours as needed for wheezing.      Marland Kitchen albuterol (PROVENTIL) (5 MG/ML) 0.5% nebulizer solution Take 0.5 mLs (2.5 mg total) by nebulization every 6 (six) hours as needed for wheezing or shortness of breath.  20 mL  12  . aspirin 81 MG tablet Take 81 mg by mouth daily.        Marland Kitchen atorvastatin (LIPITOR) 80 MG tablet Take 80 mg by mouth at bedtime.      . ferrous sulfate 325 (65 FE) MG tablet Take 650 mg by mouth every evening.      . gabapentin (NEURONTIN) 300 MG capsule Take 300 mg by mouth at bedtime.        Marland Kitchen ipratropium (ATROVENT) 0.02 % nebulizer solution Take 2.5 mLs (0.5 mg total) by nebulization every 6 (six) hours as needed.  75 mL  12  . metFORMIN (GLUCOPHAGE) 500 MG tablet Take 500 mg by mouth 2 (two) times daily with a meal.      . metoprolol (LOPRESSOR) 50 MG tablet Take 25 mg by mouth 2 (two) times daily.      . mometasone (ASMANEX) 220 MCG/INH inhaler Inhale 2 puffs into the lungs at bedtime.       . nitroGLYCERIN (NITROSTAT) 0.4 MG SL tablet Place 0.4 mg under the tongue every 5 (five) minutes as needed for chest pain.      Marland Kitchen omeprazole (PRILOSEC) 40  MG capsule Take 1 capsule (40 mg total) by mouth 2 (two) times daily before a meal.      . polyvinyl alcohol (LIQUIFILM TEARS) 1.4 % ophthalmic solution Place 1 drop into both eyes as needed.  15 mL    . predniSONE (DELTASONE) 20 MG tablet Take 2 tablets (40 mg total) by mouth daily.  15 tablet  0  . ranolazine  (RANEXA) 500 MG 12 hr tablet Take 500 mg by mouth 2 (two) times daily.        . Rivaroxaban (XARELTO) 20 MG TABS Take 1 tablet (20 mg total) by mouth daily with supper. First dose on Thu 01/25/13  30 tablet    . tamsulosin (FLOMAX) 0.4 MG CAPS capsule Take 0.4 mg by mouth daily after supper.      . Ticagrelor (BRILINTA) 90 MG TABS tablet Take 90 mg by mouth 2 (two) times daily.      . Travoprost, BAK Free, (TRAVATAN) 0.004 % SOLN ophthalmic solution Place 1 drop into both eyes at bedtime.         Scheduled:  . aspirin  81 mg Oral Daily  . atorvastatin  80 mg Oral QHS  . fluticasone  2 puff Inhalation BID  . gabapentin  300 mg Oral QHS  . [START ON 06/10/2013] influenza vac split quadrivalent PF  0.5 mL Intramuscular Tomorrow-1000  . insulin aspart  0-9 Units Subcutaneous TID WC  . latanoprost  1 drop Both Eyes QHS  . metoprolol  25 mg Oral BID  . pantoprazole  40 mg Oral Daily  . ranolazine  500 mg Oral BID  . Rivaroxaban  20 mg Oral Q supper  . tamsulosin  0.4 mg Oral QPC supper  . Ticagrelor  90 mg Oral BID  . vancomycin  1,000 mg Intravenous Q12H   Anti-infectives   Start     Dose/Rate Route Frequency Ordered Stop   06/09/13 1000  vancomycin (VANCOCIN) IVPB 1000 mg/200 mL premix     1,000 mg 200 mL/hr over 60 Minutes Intravenous Every 12 hours 06/09/13 0142     06/09/13 0600  ceFEPIme (MAXIPIME) 1 g in dextrose 5 % 50 mL IVPB  Status:  Discontinued     1 g 100 mL/hr over 30 Minutes Intravenous 3 times per day 06/09/13 0224 06/09/13 0727   06/08/13 2130  vancomycin (VANCOCIN) IVPB 1000 mg/200 mL premix     1,000 mg 200 mL/hr over 60 Minutes Intravenous  Once 06/08/13 2129 06/09/13 0118   06/08/13 2130  piperacillin-tazobactam (ZOSYN) IVPB 3.375 g  Status:  Discontinued     3.375 g 12.5 mL/hr over 240 Minutes Intravenous  Once 06/08/13 2129 06/09/13 0224      Assessment:  78 y/o [facility] patient admitted following a fall and being treated for Asp PNA.  Patient is currently  continuing Vancomycin.  Zosyn to be added for broadening coverage.  Goal of Therapy:   Vancomycin trough level 15-20 mcg/ml Antibiotics selected for infection/cultures and adjusted for renal function.   Plan:   Continue Vancomycin 1 gm IV q 12 hours. Begin Zosyn 3.375 gm IV q 8 hours, each dose to infuse over 4 hours Follow up SCr, UOP, cultures, clinical course and adjust as clinically indicated. Valinda Party, Elisha Headland,  Pharm.D.,    10/11/20147:56 AM

## 2013-06-09 NOTE — ED Notes (Signed)
Swallowed ASA w/o difficulty, (slow swallow, careful swallow, upright 90 degrees), family at [facility], report called, VSS, lab at [facility] for repeat troponin

## 2013-06-09 NOTE — Consult Note (Signed)
CARDIOLOGY CONSULT NOTE  Patient ID: Eugene Bauer, MRN: [id number], DOB/AGE: December 18, 1926 79 y.o. Admit date: 06/08/2013 Date of Consult: 06/09/2013  Primary Physician: [facility], MD Primary Cardiologist: at [facility]  Chief Complaint: syncope Reason for Consultation: elevated troponin  HPI: 79 y.o. male veteran w/ PMHx significant for COPD, CAD s/p MI, CHF with pEF, mild AS by echo 2012, atrial fibrillation, DVT who presented to [facility] on 06/08/2013 after being found at his nursing after a fall.  Chart review: Chart notes indicate that in 2012 was admitted with a cardiology consult, found to have elevated troponin. Records indicate at that time that he had a PCI attempt in 2011 which failed. More recent notes in 12/2012 indicate that he had a stent placed approx 2 weeks prior to that admission.   Noted that the patient was admitted 9/19 with syncope, mildly elevated troponin which was thought to be vasovagal. Also with superficial DVT.    Patient is a rather poor historian as he gets his dates confused and has had multiple hospitalizations which has led to more confusion. He does not recall the event. He remembers waking up next to the bed. No memory of why. Noted that his walker was in the bathroom which suggested he might have tried to go to the bathroom. No injuries. Per nursing, could have been up to 24 hours of being down on the floor. He reports trying to reach his call switch and his phone to no avail. Was found by the facility after he missed several meals.  He reports recently, that his chronic angina has improved significantly and that he no longer uses SL nitro. No CHF symptoms of PND or orthopnea. Does endorse orthostatic lightheadedness when he gets up and has had falls in the past attributed to his this.  Also noted to have significant about of blood on toilet paper after BM. No melena.   Past Medical History  Diagnosis Date  . Aortic stenosis   . High  cholesterol   . COPD (chronic obstructive pulmonary disease)   . Retinal artery occlusion   . Emphysema   . GERD (gastroesophageal reflux disease)   . Coronary artery disease   . Heart murmur   . On home oxygen therapy     "2L all the time" (01/02/2013)  . Angina   . Hypertension   . CHF (congestive heart failure)   . MI (myocardial infarction)     "I've had several; last one was 03/09/2012" (01/02/2013  . Asthma   . Pneumonia     "today; have had it at least twice before" (01/02/2013)  . Shortness of breath     "most of the time recently" (01/02/2013)  . Chronic bronchitis   . Type II diabetes mellitus   . History of stomach ulcers     "have them now; they flare up when I eat the wrong thing or don't eat" (01/02/2013)  . Hepatitis     "don't know which one" (01/02/2013)  . WUJWJXBJ(478.2)     "probably weekly" (01/02/2013)  . Stroke     "several; left ankle/foot still weak from one of them" (01/02/2013)  . Arthritis     "all over" (01/02/2013)  . DJD (degenerative joint disease)     "all over" (01/02/2013)  . Chronic lower back pain   . Urinary urgency   . Prostate cancer     "had radiation treatments" (01/02/2013)  . Basal cell carcinoma     "left nose, behind  right knee" (01/02/2013)  . Anemia     chronic/notes 01/02/2013      Surgical History:  Past Surgical History  Procedure Laterality Date  . Shoulder arthroscopy w/ rotator cuff repair Right 1990's; 2000's    "they've operated on it twice" (01/02/2013)  . Sp pta peripheral      Bilateral leg stents  . Inguinal hernia repair Bilateral     "1 wk apart" (01/02/2013)  . Coronary stent placement      "I've had 7 or 8 stents put in my heart" (01/02/2013)  . Cardiac catheterization      "couldn't get new type of stents put in" (01/02/2013)  . Coronary angioplasty with stent placement      "I've had 7 or 8 stents put in my heart; last stents put in ~ 2 wk ago" (01/02/2013)  . Back surgery    . Anterior fusion cervical spine      "C5-7; took  bone off my right hip" (5-01/2013)     Home Meds: Prior to Admission medications   Medication Sig Start Date End Date Taking? Authorizing Provider  acetaminophen (TYLENOL) 500 MG tablet Take 500 mg by mouth every 6 (six) hours as needed for pain.   Yes Historical Provider, MD  albuterol (PROVENTIL HFA;VENTOLIN HFA) 108 (90 BASE) MCG/ACT inhaler Inhale 2 puffs into the lungs every 6 (six) hours as needed for wheezing.   Yes Historical Provider, MD  albuterol (PROVENTIL) (5 MG/ML) 0.5% nebulizer solution Take 0.5 mLs (2.5 mg total) by nebulization every 6 (six) hours as needed for wheezing or shortness of breath. 05/21/13  Yes Ejiroghene Emokpae, MD  aspirin 81 MG tablet Take 81 mg by mouth daily.     Yes Historical Provider, MD  atorvastatin (LIPITOR) 80 MG tablet Take 80 mg by mouth at bedtime.   Yes Historical Provider, MD  ferrous sulfate 325 (65 FE) MG tablet Take 650 mg by mouth every evening.   Yes Historical Provider, MD  gabapentin (NEURONTIN) 300 MG capsule Take 300 mg by mouth at bedtime.     Yes Historical Provider, MD  ipratropium (ATROVENT) 0.02 % nebulizer solution Take 2.5 mLs (0.5 mg total) by nebulization every 6 (six) hours as needed. 05/21/13  Yes Ejiroghene Emokpae, MD  metFORMIN (GLUCOPHAGE) 500 MG tablet Take 500 mg by mouth 2 (two) times daily with a meal.   Yes Historical Provider, MD  metoprolol (LOPRESSOR) 50 MG tablet Take 25 mg by mouth 2 (two) times daily.   Yes Historical Provider, MD  mometasone [facility]) 220 MCG/INH inhaler Inhale 2 puffs into the lungs at bedtime.    Yes Historical Provider, MD  nitroGLYCERIN (NITROSTAT) 0.4 MG SL tablet Place 0.4 mg under the tongue every 5 (five) minutes as needed for chest pain.   Yes Historical Provider, MD  omeprazole (PRILOSEC) 40 MG capsule Take 1 capsule (40 mg total) by mouth 2 (two) times daily before a meal. 01/05/13  Yes Shanker Levora Dredge, MD  polyvinyl alcohol (LIQUIFILM TEARS) 1.4 % ophthalmic solution Place 1 drop into  both eyes as needed. 01/05/13  Yes Shanker Levora Dredge, MD  predniSONE (DELTASONE) 20 MG tablet Take 2 tablets (40 mg total) by mouth daily. 05/21/13  Yes Ejiroghene Emokpae, MD  ranolazine (RANEXA) 500 MG 12 hr tablet Take 500 mg by mouth 2 (two) times daily.     Yes Historical Provider, MD  Rivaroxaban (XARELTO) 20 MG TABS Take 1 tablet (20 mg total) by mouth daily with supper. First dose on  Thu 01/25/13 01/25/13  Yes Shanker Levora Dredge, MD  tamsulosin (FLOMAX) 0.4 MG CAPS capsule Take 0.4 mg by mouth daily after supper.   Yes Historical Provider, MD  Ticagrelor (BRILINTA) 90 MG TABS tablet Take 90 mg by mouth 2 (two) times daily.   Yes Historical Provider, MD  Travoprost, BAK Free, (TRAVATAN) 0.004 % SOLN ophthalmic solution Place 1 drop into both eyes at bedtime.     Yes Historical Provider, MD    Inpatient Medications:   . piperacillin-tazobactam (ZOSYN)  IV    . vancomycin 1,000 mg (06/09/13 0018)    Allergies:  Allergies  Allergen Reactions  . Iodine Shortness Of Breath  . Iohexol      Code: SOB, Desc: ivp dye, iodine, Onset Date: [id number]     History   Social History  . Marital Status: Single    Spouse Name: N/A    Number of Children: N/A  . Years of Education: N/A   Occupational History  . Not on file.   Social History Main Topics  . Smoking status: Never Smoker   . Smokeless tobacco: Former User    Types: Chew     Comment: 01/02/2013 "chewed in high school playing ball"  . Alcohol Use: No  . Drug Use: No  . Sexual Activity: No   Other Topics Concern  . Not on file   Social History Narrative  . No narrative on file     Family History  Problem Relation Age of Onset  . Coronary artery disease Brother 18  . Coronary artery disease Sister      Review of Systems: General: negative for chills, fever, night sweats or weight changes.  Cardiovascular: see HPI Dermatological: negative for rash Respiratory: negative for cough or wheezing Urologic: negative for  hematuria Abdominal: negative for nausea, vomiting, diarrhea, Neurologic: negative for visual changes All other systems reviewed and are otherwise negative except as noted above.  Labs:  Recent Labs  06/08/13 2016  CKTOTAL 1281*  CKMB 10.7*   Lab Results  Component Value Date   WBC 14.3* 06/08/2013   HGB 10.3* 06/08/2013   HCT 28.5* 06/08/2013   MCV 80.5 06/08/2013   PLT 140* 06/08/2013    Recent Labs Lab 06/08/13 2016  NA 127*  K 4.2  CL 91*  CO2 26  BUN 14  CREATININE 0.77  CALCIUM 9.5  PROT 5.9*  BILITOT 1.0  ALKPHOS 54  ALT 22  AST 52*  GLUCOSE 135*   Lab Results  Component Value Date   CHOL  Value: 178        ATP III CLASSIFICATION:  <200     mg/dL   Desirable  952-841  mg/dL   Borderline High  >=324    mg/dL   High        4/0/1027   HDL 46 09/30/2010   LDLCALC  Value: 93        Total Cholesterol/HDL:CHD Risk Coronary Heart Disease Risk Table                     Men   Women  1/2 Average Risk   3.4   3.3  Average Risk       5.0   4.4  2 X Average Risk   9.6   7.1  3 X Average Risk  23.4   11.0        Use the calculated Patient Ratio above and the CHD Risk Table to determine the patient's CHD  Risk.        ATP III CLASSIFICATION (LDL):  <100     mg/dL   Optimal  161-096  mg/dL   Near or Above                    Optimal  130-159  mg/dL   Borderline  045-409  mg/dL   High  >811     mg/dL   Very High 04/30/4781   TRIG 197* 09/30/2010    Radiology/Studies:  Dg Chest 2 View (if Patient Has Fever And/or Copd)  05/18/2013   CLINICAL DATA:  Shortness of Breath. Fatigue.  EXAM: CHEST  2 VIEW  COMPARISON:  05/17/2013, 01/02/2013 and 06/15/2012.  FINDINGS: Central pulmonary vascular prominence stable.  Calcified aorta.  Heart size top normal with coronary artery calcifications noted.  No infiltrate, congestive heart failure or pneumothorax.  No plain film evidence of pulmonary malignancy.  IMPRESSION: No acute abnormality. Please see above.   Electronically Signed   By: Bridgett Larsson    On: 05/18/2013 11:06   Dg Chest 2 View  05/17/2013   CLINICAL DATA:  Coughing and history of pneumonia.  EXAM: CHEST  2 VIEW  COMPARISON:  01/02/2013  FINDINGS: Two views of the chest demonstrate clear lungs. There is no focal airspace disease and no evidence for pulmonary edema. Bony thorax is intact. Heart and mediastinum are within normal limits.  IMPRESSION: No acute cardiopulmonary disease.   Electronically Signed   By: Richarda Overlie M.D.   On: 05/17/2013 20:42   Dg Pelvis 1-2 Views  06/08/2013   CLINICAL DATA:  Larey Seat. Weakness.  EXAM: PELVIS - 1-2 VIEW  COMPARISON:  Abdomen radiographs dated 09/30/2010.  FINDINGS: No fracture dislocation. Arterial calcifications. Bilateral common iliac stents.  IMPRESSION: No fracture or dislocation.   Electronically Signed   By: Gordan Payment M.D.   On: 06/08/2013 21:39   Ct Head Wo Contrast  06/08/2013   *RADIOLOGY REPORT*  Clinical Data:  Status post fall.  Concern for head or cervical spine injury.  CT HEAD WITHOUT CONTRAST AND CT CERVICAL SPINE WITHOUT CONTRAST  Technique:  Multidetector CT imaging of the head and cervical spine was performed following the standard protocol without intravenous contrast.  Multiplanar CT image reconstructions of the cervical spine were also generated.  Comparison: CT of the head the cervical spine performed 04/27/2010, and MRI of the brain performed 05/18/2013  CT HEAD  Findings: There is no evidence of acute infarction, mass lesion, or intra- or extra-axial hemorrhage on CT.  Prominence of the ventricles and sulci reflects mild cortical volume loss.  Scattered periventricular white matter change likely reflects small vessel ischemic microangiopathy.  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  The brainstem and fourth ventricle are within normal limits.  The basal ganglia are unremarkable in appearance.  The cerebral hemispheres demonstrate grossly  normal gray-white differentiation. No mass effect or midline shift is seen.  There is no evidence of fracture; visualized osseous structures are unremarkable in appearance.  The orbits are within normal limits. The paranasal sinuses and mastoid air cells are well-aerated.  No significant soft tissue abnormalities are seen.  IMPRESSION:  1.  No evidence of traumatic intracranial injury or fracture. 2.  Mild cortical volume loss and scattered small vessel ischemic microangiopathy. 3.  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  CT CERVICAL SPINE  Findings:  There is no evidence of fracture or subluxation. Vertebral bodies demonstrate normal height and alignment.  There is mild narrowing of the intervertebral disc space at C3-C4.  There is chronic osseous fusion at C5-C7.  Small posterior disc osteophyte complexes are noted along the cervical spine.  Prevertebral soft tissues are within normal limits.  The visualized portions of the thyroid gland are unremarkable in appearance.  The visualized lung apices are clear.  Dense calcification is noted at the carotid bifurcations bilaterally.  IMPRESSION:  1.  No evidence of fracture or subluxation along the cervical spine. 2.  Minimal degenerative change along the cervical spine, with chronic osseous fusion at C5-C7. 3.  Dense calcification at the carotid bifurcations bilaterally. Carotid ultrasound would be helpful for further evaluation, on an elective non-emergent basis.   Original Report Authenticated By: Tonia Ghent, M.D.   Ct Cervical Spine Wo Contrast  06/08/2013   *RADIOLOGY REPORT*  Clinical Data:  Status post fall.  Concern for head or cervical spine injury.  CT HEAD WITHOUT CONTRAST AND CT CERVICAL SPINE WITHOUT CONTRAST  Technique:  Multidetector CT imaging of the head and cervical spine was performed following the standard protocol without intravenous contrast.  Multiplanar CT image  reconstructions of the cervical spine were also generated.  Comparison: CT of the head the cervical spine performed 04/27/2010, and MRI of the brain performed 05/18/2013  CT HEAD  Findings: There is no evidence of acute infarction, mass lesion, or intra- or extra-axial hemorrhage on CT.  Prominence of the ventricles and sulci reflects mild cortical volume loss.  Scattered periventricular white matter change likely reflects small vessel ischemic microangiopathy.  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  The brainstem and fourth ventricle are within normal limits.  The basal ganglia are unremarkable in appearance.  The cerebral hemispheres demonstrate grossly normal gray-white differentiation. No mass effect or midline shift is seen.  There is no evidence of fracture; visualized osseous structures are unremarkable in appearance.  The orbits are within normal limits. The paranasal sinuses and mastoid air cells are well-aerated.  No significant soft tissue abnormalities are seen.  IMPRESSION:  1.  No evidence of traumatic intracranial injury or fracture. 2.  Mild cortical volume loss and scattered small vessel ischemic microangiopathy. 3.  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  CT CERVICAL SPINE  Findings: There is no evidence of fracture or subluxation. Vertebral bodies demonstrate normal height and alignment.  There is mild narrowing of the intervertebral disc space at C3-C4.  There is chronic osseous fusion at C5-C7.  Small posterior disc osteophyte complexes are noted along the cervical spine.  Prevertebral soft tissues are within normal limits.  The visualized portions of the thyroid gland are unremarkable in appearance.  The visualized lung apices are clear.  Dense calcification is noted at the carotid bifurcations bilaterally.  IMPRESSION:  1.  No  evidence of fracture or subluxation along the cervical spine. 2.  Minimal degenerative change along the cervical spine, with chronic osseous fusion at C5-C7. 3.  Dense calcification at the carotid bifurcations bilaterally. Carotid ultrasound would be helpful for further evaluation, on an elective non-emergent basis.   Original Report Authenticated By: Tonia Ghent, M.D.   Mr Laqueta Jean Wo Contrast  05/18/2013   *RADIOLOGY REPORT*  Clinical Data: Evaluate for stroke  MRI HEAD WITHOUT AND WITH CONTRAST  Technique:  Multiplanar, multiecho pulse sequences  of the brain and surrounding structures were obtained according to standard protocol without and with intravenous contrast  Contrast: 15mL MULTIHANCE GADOBENATE DIMEGLUMINE 529 MG/ML IV SOLN  Comparison: Prior CT from 09/29/2010.  Findings: Diffuse generalized atrophy is present.  Scattered T2 / FLAIR hyperintense foci within the periventricular deep white matter are most consistent with chronic microvascular ischemic disease. FLAIR signal with encephalomalacia within the high right frontal lobe near the vertex is consistent with remote infarct in this region.  No diffusion weighted signal abnormalities identified to suggest acute intracranial infarct.  There is no acute intracranial hemorrhage.  No midline shift or mass lesion.  No hydrocephalus.  There is no extra-axial fluid collection.  Pituitary gland is normal.  Pituitary stalk is midline.  Optic nerves and optic chiasm are normal.  Craniocervical junction is within normal limits.  Corpus callosum is intact.  Calvarium and scalp soft tissues are normal.  No abnormal enhancement is seen within the brain on post contrast sequences.  There is mucosal enhancement with air fluid level within the right sphenoid sinus. Right mastoid effusion is present.  IMPRESSION: 1. No acute intracranial infarct. 2. Encephalomalacia with hyperintense T2/FLAIR signal within the high right frontal lobe near the vertex, consistent with  remote ischemic infarct. 3.  Atrophy with microvascular ischemic changes. 4.  Right mastoid effusion. 5.  Right sphenoid sinus disease.   Original Report Authenticated By: Rise Mu, M.D.   Dg Chest Portable 1 View  06/08/2013   CLINICAL DATA:  History of trauma from a fall complaining chest pain.  EXAM: PORTABLE CHEST - 1 VIEW  COMPARISON:  CHEST x-ray 04/07/2020 2014.  FINDINGS: New area of airspace consolidation in the left lower lobe partially obscuring the left hemidiaphragm. Probable small left pleural effusion. Right lung appears clear. Mild diffuse peribronchial cuffing. Heart size appears borderline enlarged. No evidence of pulmonary edema. Mediastinal contours are unremarkable. Atherosclerosis in the thoracic aorta. No definite pneumothorax. Visualized portions of the bony thorax appears grossly intact.  IMPRESSION: 1. In the setting of recent trauma, the appearance of the left lower lobe is concerning for potential contusion, however, this would be unusual for a typical fall from a standing or seated position. If the patient fell from a significant height, this warrants consideration. Alternatively, this may reflect sequela of aspiration associated with the trauma, or could simply indicate an underlying infection that pre-existed the prior trauma. Clinical correlation is recommended. There is a small left pleural effusion. 2. No pneumothorax or definite rib fractures identified. 3. Atherosclerosis.   Electronically Signed   By: Trudie Reed M.D.   On: 06/08/2013 20:36   Dg Chest Port 1 View  05/20/2013   CLINICAL DATA:  Airspace disease  EXAM: PORTABLE CHEST - 1 VIEW  COMPARISON:  05/18/2013  FINDINGS: Lungs are clear. No pleural effusion or pneumothorax.  Mild cardiomegaly.  Degenerative changes of the bilateral shoulders.  IMPRESSION: No evidence of acute cardiopulmonary disease.   Electronically Signed   By: Charline Bills M.D.   On: 05/20/2013 07:19    EKG: sinus, no ST or  TW changes  Physical Exam: Blood pressure 142/49, pulse 46, temperature 99.1 F (37.3 C), temperature source Oral, resp. rate 27, height 5\' 9"  (1.753 m), weight 78.019 kg (172 lb), SpO2 93.00%. General: Well developed, well nourished, in no acute distress. Head: Normocephalic, atraumatic, sclera non-icteric, no xanthomas, nares are without discharge.  Neck: Supple. Negative for carotid bruits. JVD not elevated. Lungs: Clear bilaterally to auscultation without wheezes, rales, or  rhonchi. Breathing is unlabored. Heart: RRR with S1 S2. 3/6 SEM c/w aortic position Abdomen: Soft, non-tender, non-distended with normoactive bowel sounds. No hepatomegaly. No rebound/guarding. No obvious abdominal masses. Msk:  Strength and tone appear normal for age. Extremities: No clubbing or cyanosis. +1-2 edema bil.  Distal pedal pulses are 2+ and equal bilaterally. Neuro: Alert and oriented X 3. Moves all extremities spontaneously. Psych:  Responds to questions appropriately with a normal affect.   Problem List 1. Syncope, with possible prolonged down time 2. Mild rhabdomyolysis 3. Elevated troponins 4. Known CAD, s/p stent ~12/2012 5. DVT, on anticoagulation 6. Rectal bleeding 7. HTN 8. Hyperlipidemia 9. Aortic stenosis, mild by echo 2012  Assessment and Plan:  79 y.o. male veteran w/ PMHx significant for COPD, CAD s/p MI s/p multiple stents, CHF with pEF, mild AS by echo 2012, atrial fibrillation, DVT who presented to [facility] on 06/08/2013 after being found at his nursing after a fall. Now with elevated troponin.  In regards to whether his troponin represents true plaque rupture and acute coronary syndrome, this is unclear. He is certainly at high risk given his history of MIs and multiple stents. However, he is currently asymptomatic. Other cause of his elevated troponin could be demand ischemia from a hypotensive syncopal event, Finally, his mild rhabdo could also lead to a mild troponin  elevation. At this time, would continue to treat with continuing his aspirin, plavix, statin, BB. Would NOT favor heparinization given that he is already on rivaroxaban and is have BRBPR (?hemorrhoids). In the setting of ishemic CAD, would prefer to maintain Hct > 28. No acute indication to proceed to catheterization at this time.  In regards to his syncopal event, his history is rather unrevealing. Possibilities include orthostasis (at risk, has history), ischemic/arrhythmic event (nl EF previously), valvular disease (mild AS 2 yrs ago, doubt it would progress to severe by now).    Recommendations: -please obtain outside records from the [place] regarding his cath and PCI at the [facility] ~12/2012 -cycle troponins -continue aspirin, atorvastatin, ARB, beta blocker and ticagrelor (or should it be plavix, why the change from d/c from 12/2012?- appears to be due to interim admission in which he was perhaps erroneously changed from plavix to brilinta) -transfuse if needed to keep Hct > 28 in the setting of acute ischemia -echo to eval EF and AV (pending)  Thank you for allowing me to participate in Mr. Juday care. Will follow up in the AM. Please call with questions.  Signed, Adolm Joseph, Sheriden Archibeque C. MD 06/09/2013, 12:44 AM

## 2013-06-09 NOTE — Progress Notes (Signed)
Echocardiogram 2D Echocardiogram has been performed.  Dorothey Baseman 06/09/2013, 10:24 AM

## 2013-06-09 NOTE — H&P (Signed)
INTERNAL MEDICINE TEACHING SERVICE Attending Admission Note  Date: 06/09/2013  Patient name: Eugene Bauer  Medical record number: [id number]  Date of birth: 11/12/1926    I have seen and evaluated Eugene Bauer and discussed their care with the Residency Team. Patient seen and examined.  79 yr old male with hx HFpEF, mild-moderate AS, Type 2 DM, HTN, CAD, presented with syncope.  The patient was found in his bathroom where he had been lying for hours due to inability to pull the call cord.  The patient did not remember how get go there.  He admits to it being unusual for him to be in the restroom with his walker.  He admits to some recent difficulty with choking when eating, he used to be oxygen requiring but no longer wears it.  He was recently admitted for a COPD exacerbation at the [facility].      CT head/CT spine negative for injury or fracture.  His CPK is elevated, but I would not classify this as rhabo at 1281.  He has a positive troponin.  Appreciate cardiology recs. He is chest pain free. Continue ASA, statin, ARB, BB, ticagrelor. [facility].  [facility]E pending.   Chest xray with concerns for aspiration. Would have SLP seen him. Agree with Vanc and Zosyn for now.   He is dry clinically so I would give him 250 cc NS gently at 50 cc/hr IV today. He may have ice chips until SLP sees him.  Jonah Blue, DO, FACP Faculty [facility] Internal Medicine Residency Program 06/09/2013, 9:43 AM

## 2013-06-09 NOTE — Progress Notes (Signed)
Assisted pt up to sitting on side of bed to void.  Small Blood clots noted on floor along with smears on bed sheets.  No signs of bleeding noted except small smear from rectum.  Skin intact except for scab areas.  Pt has had episodes of small  bloody smears earlier today Md aware.  Will continue to monitor.  Vs stable.  Eugene Bauer

## 2013-06-09 NOTE — Evaluation (Signed)
Physical Therapy Evaluation Patient Details Name: Eugene Bauer MRN: [id number] DOB: 12-16-1926 Today's Date: 06/09/2013 Time: 6213-0865 PT Time Calculation (min): 17 min  PT Assessment / Plan / Recommendation History of Present Illness  79 yr old male with hx HFpEF, mild-moderate AS, Type 2 DM, HTN, CAD, presented with syncope.  The patient was found in his bathroom where he had been lying for hours due to inability to pull the call cord.  The patient did not remember how get go there.  He admits to it being unusual for him to be in the restroom with his walker.  He admits to some recent difficulty with choking when eating, he used to be oxygen requiring but no longer wears it.  He was recently admitted for a COPD exacerbation at the [facility].   Clinical Impression  Patient presents with problems listed below. Will benefit from acute PT to maximize independence prior to discharge.  Recommend SNF at discharge for continued therapy.    PT Assessment  Patient needs continued PT services    Follow Up Recommendations  SNF    Does the patient have the potential to tolerate intense rehabilitation      Barriers to Discharge Decreased caregiver support Patient in ILF    Equipment Recommendations  None recommended by PT    Recommendations for Other Services     Frequency Min 3X/week    Precautions / Restrictions Precautions Precautions: Fall Restrictions Weight Bearing Restrictions: No   Pertinent Vitals/Pain       Mobility  Bed Mobility Bed Mobility: Rolling Left;Left Sidelying to Sit;Sitting - Scoot to Edge of Bed Rolling Left: 4: Min assist;With rail Left Sidelying to Sit: 3: Mod assist;With rails;HOB elevated Sitting - Scoot to Edge of Bed: 3: Mod assist Details for Bed Mobility Assistance: Verbal cues for technique.   Transfers Transfers: Sit to Stand;Stand to Sit;Stand Pivot Transfers Sit to Stand: 1: +2 Total assist;With upper extremity assist;From bed Sit to Stand:  Patient Percentage: 60% Stand to Sit: 3: Mod assist;With upper extremity assist;With armrests;To chair/3-in-1 Stand Pivot Transfers: 1: +2 Total assist Stand Pivot Transfers: Patient Percentage: 60% Details for Transfer Assistance: Verbal cues for hand placement.  Assist to rise to standing.  Patient required repeated verbal and tactile cues to pivot to chair.  Noted drops of bright red blood on floor - RN notified. Ambulation/Gait Ambulation/Gait Assistance: Not tested (comment)    Exercises     PT Diagnosis: Difficulty walking;Generalized weakness  PT Problem List: Decreased strength;Decreased activity tolerance;Decreased balance;Decreased mobility;Decreased knowledge of use of DME PT Treatment Interventions: DME instruction;Gait training;Functional mobility training;Therapeutic exercise;Balance training;Patient/family education     PT Goals(Current goals can be found in the care plan section) Acute Rehab PT Goals Patient Stated Goal: To return home PT Goal Formulation: With patient Time For Goal Achievement: 06/23/13 Potential to Achieve Goals: Good  Visit Information  Last PT Received On: 06/09/13 Assistance Needed: +2 (Lines/safety) History of Present Illness: 79 yr old male with hx HFpEF, mild-moderate AS, Type 2 DM, HTN, CAD, presented with syncope.  The patient was found in his bathroom where he had been lying for hours due to inability to pull the call cord.  The patient did not remember how get go there.  He admits to it being unusual for him to be in the restroom with his walker.  He admits to some recent difficulty with choking when eating, he used to be oxygen requiring but no longer wears it.  He was recently admitted  for a COPD exacerbation at the [facility].        Prior Functioning  Home Living Family/patient expects to be discharged to:: Skilled nursing facility Living Arrangements: Alone Additional Comments: pt lives at [facility]  Per pt  they only provide A for meals and cleaning.   Prior Function Level of Independence: Independent with assistive device(s) Gait / Transfers Assistance Needed: Uses RW Communication Communication: HOH    Cognition  Cognition Arousal/Alertness: Awake/alert Behavior During Therapy: WFL for tasks assessed/performed Overall Cognitive Status: Within Functional Limits for tasks assessed    Extremity/Trunk Assessment Upper Extremity Assessment Upper Extremity Assessment: Generalized weakness Lower Extremity Assessment Lower Extremity Assessment: Generalized weakness   Balance Balance Balance Assessed: Yes Static Sitting Balance Static Sitting - Balance Support: No upper extremity supported;Feet supported Static Sitting - Level of Assistance: 5: Stand by assistance Static Sitting - Comment/# of Minutes: 4 Static Standing Balance Static Standing - Balance Support: Bilateral upper extremity supported Static Standing - Level of Assistance: 4: Min assist Static Standing - Comment/# of Minutes: 2 minutes.  Patient with posterior lean and flexed posture  End of Session PT - End of Session Equipment Utilized During Treatment: Gait belt Activity Tolerance: Patient limited by fatigue Patient left: in chair;with call bell/phone within reach Nurse Communication: Mobility status (Blood drops on floor following ambulation.  Bed urine-soaked)  GP     Vena Austria 06/09/2013, 5:31 PM  Durenda Hurt. Renaldo Fiddler, [facility] Acute Rehab Services Pager [phone]

## 2013-06-09 NOTE — Progress Notes (Signed)
Spoke with IM doctor and reported that the patient had bright red blood coating what appeared to be just be on the outside surface of pts stool. Plan to monitor pt. Cardiology doctor also observed the stool and agreed that it was appropriate to just monitor the pt for now. IM doctor agreed with plan to crush meds and give in applesauce for tonight, until the pt is seen by Speech Therapy. Pt failed RN bedside swallow in the ED, and is currently NPO. Pt had also been on thickened liquids in the past r/t a CVA.   Gwynneth Macleod RN BSN

## 2013-06-09 NOTE — Progress Notes (Signed)
ANTIBIOTIC CONSULT NOTE - INITIAL  Pharmacy Consult for vancomycin Indication: rule out pneumonia  Allergies  Allergen Reactions  . Iodine Shortness Of Breath  . Iohexol      Code: SOB, Desc: ivp dye, iodine, Onset Date: [id number]     Patient Measurements: Height: 5\' 9"  (175.3 cm) Weight: 159 lb 2.8 oz (72.2 kg) IBW/kg (Calculated) : 70.7  Vital Signs: Temp: 98.9 F (37.2 C) (10/11 0102) Temp src: Oral (10/11 0102) BP: 142/49 mmHg (10/11 0031) Pulse Rate: 46 (10/11 0031)  Labs:  Recent Labs  06/08/13 2016  WBC 14.3*  HGB 10.3*  PLT 140*  CREATININE 0.77   Estimated Creatinine Clearance: 70 ml/min (by C-G formula based on Cr of 0.77).   Microbiology: Recent Results (from the past 720 hour(s))  MRSA PCR SCREENING     Status: None   Collection Time    05/18/13  6:50 PM      Result Value Range Status   MRSA by PCR NEGATIVE  NEGATIVE Final   Comment:            The GeneXpert MRSA Assay (FDA     approved for NASAL specimens     only), is one component of a     comprehensive MRSA colonization     surveillance program. It is not     intended to diagnose MRSA     infection nor to guide or     monitor treatment for     MRSA infections.    Medical History: Past Medical History  Diagnosis Date  . Aortic stenosis   . High cholesterol   . COPD (chronic obstructive pulmonary disease)   . Retinal artery occlusion   . Emphysema   . GERD (gastroesophageal reflux disease)   . Coronary artery disease   . Heart murmur   . On home oxygen therapy     "2L all the time" (01/02/2013)  . Angina   . Hypertension   . CHF (congestive heart failure)   . MI (myocardial infarction)     "I've had several; last one was 03/09/2012" (01/02/2013  . Asthma   . Pneumonia     "today; have had it at least twice before" (01/02/2013)  . Shortness of breath     "most of the time recently" (01/02/2013)  . Chronic bronchitis   . Type II diabetes mellitus   . History of stomach ulcers    "have them now; they flare up when I eat the wrong thing or don't eat" (01/02/2013)  . Hepatitis     "don't know which one" (01/02/2013)  . BJYNWGNF(621.3)     "probably weekly" (01/02/2013)  . Stroke     "several; left ankle/foot still weak from one of them" (01/02/2013)  . Arthritis     "all over" (01/02/2013)  . DJD (degenerative joint disease)     "all over" (01/02/2013)  . Chronic lower back pain   . Urinary urgency   . Prostate cancer     "had radiation treatments" (01/02/2013)  . Basal cell carcinoma     "left nose, behind right knee" (01/02/2013)  . Anemia     chronic/notes 01/02/2013    Medications:  Prescriptions prior to admission  Medication Sig Dispense Refill  . acetaminophen (TYLENOL) 500 MG tablet Take 500 mg by mouth every 6 (six) hours as needed for pain.      Marland Kitchen albuterol (PROVENTIL HFA;VENTOLIN HFA) 108 (90 BASE) MCG/ACT inhaler Inhale 2 puffs into the lungs every 6 (six)  hours as needed for wheezing.      Marland Kitchen albuterol (PROVENTIL) (5 MG/ML) 0.5% nebulizer solution Take 0.5 mLs (2.5 mg total) by nebulization every 6 (six) hours as needed for wheezing or shortness of breath.  20 mL  12  . aspirin 81 MG tablet Take 81 mg by mouth daily.        Marland Kitchen atorvastatin (LIPITOR) 80 MG tablet Take 80 mg by mouth at bedtime.      . ferrous sulfate 325 (65 FE) MG tablet Take 650 mg by mouth every evening.      . gabapentin (NEURONTIN) 300 MG capsule Take 300 mg by mouth at bedtime.        Marland Kitchen ipratropium (ATROVENT) 0.02 % nebulizer solution Take 2.5 mLs (0.5 mg total) by nebulization every 6 (six) hours as needed.  75 mL  12  . metFORMIN (GLUCOPHAGE) 500 MG tablet Take 500 mg by mouth 2 (two) times daily with a meal.      . metoprolol (LOPRESSOR) 50 MG tablet Take 25 mg by mouth 2 (two) times daily.      . mometasone (ASMANEX) 220 MCG/INH inhaler Inhale 2 puffs into the lungs at bedtime.       . nitroGLYCERIN (NITROSTAT) 0.4 MG SL tablet Place 0.4 mg under the tongue every 5 (five) minutes as needed  for chest pain.      Marland Kitchen omeprazole (PRILOSEC) 40 MG capsule Take 1 capsule (40 mg total) by mouth 2 (two) times daily before a meal.      . polyvinyl alcohol (LIQUIFILM TEARS) 1.4 % ophthalmic solution Place 1 drop into both eyes as needed.  15 mL    . predniSONE (DELTASONE) 20 MG tablet Take 2 tablets (40 mg total) by mouth daily.  15 tablet  0  . ranolazine (RANEXA) 500 MG 12 hr tablet Take 500 mg by mouth 2 (two) times daily.        . Rivaroxaban (XARELTO) 20 MG TABS Take 1 tablet (20 mg total) by mouth daily with supper. First dose on Thu 01/25/13  30 tablet    . tamsulosin (FLOMAX) 0.4 MG CAPS capsule Take 0.4 mg by mouth daily after supper.      . Ticagrelor (BRILINTA) 90 MG TABS tablet Take 90 mg by mouth 2 (two) times daily.      . Travoprost, BAK Free, (TRAVATAN) 0.004 % SOLN ophthalmic solution Place 1 drop into both eyes at bedtime.         Scheduled:  . [START ON 06/10/2013] influenza vac split quadrivalent PF  0.5 mL Intramuscular Tomorrow-1000  . piperacillin-tazobactam (ZOSYN)  IV  3.375 g Intravenous Once    Assessment: 79yo male fell at [facility] and was on floor x24-36hr, no complaints and oriented but does not recall falling, CXR concerning for aspiration PNA, to begin IV ABX.  Goal of Therapy:  Vancomycin trough level 15-20 mcg/ml  Plan:  Rec'd vanc 1g in ED; will continue with vancomycin 1000mg  IV Q12H (a few months ago pt had trough of ~12 on 750mg  Q12H) and monitor CBC, Cx, levels prn.  Vernard Gambles, PharmD, BCPS  06/09/2013,1:20 AM

## 2013-06-09 NOTE — Evaluation (Signed)
Clinical/Bedside Swallow Evaluation Patient Details  Name: Eugene Bauer MRN: [id number] Date of Birth: 1927/05/01  Today's Date: 06/09/2013 Time:  -     Past Medical History:  Past Medical History  Diagnosis Date  . Aortic stenosis   . High cholesterol   . COPD (chronic obstructive pulmonary disease)   . Retinal artery occlusion   . Emphysema   . GERD (gastroesophageal reflux disease)   . Coronary artery disease   . Heart murmur   . On home oxygen therapy     "2L all the time" (01/02/2013)  . Angina   . Hypertension   . CHF (congestive heart failure)   . MI (myocardial infarction)     "I've had several; last one was 03/09/2012" (01/02/2013  . Asthma   . Pneumonia     "today; have had it at least twice before" (01/02/2013)  . Shortness of breath     "most of the time recently" (01/02/2013)  . Chronic bronchitis   . Type II diabetes mellitus   . History of stomach ulcers     "have them now; they flare up when I eat the wrong thing or don't eat" (01/02/2013)  . Hepatitis     "don't know which one" (01/02/2013)  . WUJWJXBJ(478.2)     "probably weekly" (01/02/2013)  . Stroke     "several; left ankle/foot still weak from one of them" (01/02/2013)  . Arthritis     "all over" (01/02/2013)  . DJD (degenerative joint disease)     "all over" (01/02/2013)  . Chronic lower back pain   . Urinary urgency   . Prostate cancer     "had radiation treatments" (01/02/2013)  . Basal cell carcinoma     "left nose, behind right knee" (01/02/2013)  . Anemia     chronic/notes 01/02/2013   Past Surgical History:  Past Surgical History  Procedure Laterality Date  . Shoulder arthroscopy w/ rotator cuff repair Right 1990's; 2000's    "they've operated on it twice" (01/02/2013)  . Sp pta peripheral      Bilateral leg stents  . Inguinal hernia repair Bilateral     "1 wk apart" (01/02/2013)  . Coronary stent placement      "I've had 7 or 8 stents put in my heart" (01/02/2013)  . Cardiac catheterization     "couldn't get new type of stents put in" (01/02/2013)  . Coronary angioplasty with stent placement      "I've had 7 or 8 stents put in my heart; last stents put in ~ 2 wk ago" (01/02/2013)  . Back surgery    . Anterior fusion cervical spine      "C5-7; took bone off my right hip" (5-01/2013)   HPI:   79 yr old male with hx HFpEF, mild-moderate AS, Type 2 DM, HTN, CAD, presented with syncope.  The patient was found in his bathroom where he had been lying for hours due to inability to pull the call cord.  The patient did not remember how get go there.  He admits to it being unusual for him to be in the restroom with his walker.  He admits to some recent difficulty with choking when eating, he used to be oxygen requiring but no longer wears it.  He was recently admitted for a COPD exacerbation at the [facility]. BSE indicated to assess risk for aspiration and recommend safest, PO diet.       Assessment / Plan / Recommendation Clinical Impression  Oral  phase functional with no weakness noted.  Suspect minimal pharyngeal phase and esophageal dysphgagia with increased dry coughing moderately after swallow of PO trials.  Coughing noted when repositioned.  Question residuals vs. reflux secondary to s/s present .  Strategies of double swallow per bite, alternating bites with sips, and intermittent throat clear effective in reducing s/s of penetration vs. aspiration.  Recommend to proceed with regular consistency and thin liquids with aspiration and reflux precautions.  ST to follow closely in acute care setting for diet tolerance due to s/s present and PMH significant for PNA.  Completion of objective evaluation to be determined.      Aspiration Risk  Moderate    Diet Recommendation Regular;Thin liquid   Liquid Administration via: Cup;No straw Medication Administration: Whole meds with liquid Supervision: Patient able to self feed;Intermittent supervision to cue for compensatory strategies Compensations: Slow  rate;Small sips/bites;Multiple dry swallows after each bite/sip;Follow solids with liquid;Clear throat intermittently;Effortful swallow Postural Changes and/or Swallow Maneuvers: Seated upright 90 degrees;Out of bed for meals;Upright 30-60 min after meal    Other  Recommendations Oral Care Recommendations: Oral care Q4 per protocol   Follow Up Recommendations  Skilled Nursing facility    Frequency and Duration min 2x/week  2 weeks       SLP Swallow Goals Patient will utilize recommended strategies during swallow to increase swallowing safety with: Supervision/safety   Swallow Study Prior Functional Status  ALF    General Date of Onset: 06/08/13 HPI:  have seen and evaluated Vivianne Spence and discussed their care with the Residency Team. Patient seen and examined.  79 yr old male with hx HFpEF, mild-moderate AS, Type 2 DM, HTN, CAD, presented with syncope.  The patient was found in his bathroom where he had been lying for hours due to inability to pull the call cord.  The patient did not remember how get go there.  He admits to it being unusual for him to be in the restroom with his walker.  He admits to some recent difficulty with choking when eating, he used to be oxygen requiring but no longer wears it.  He was recently admitted for a COPD exacerbation at the [facility].       Type of Study: Bedside swallow evaluation Diet Prior to this Study: NPO Temperature Spikes Noted: No Respiratory Status: Room air History of Recent Intubation: No Behavior/Cognition: Alert;Cooperative;Pleasant mood;Requires cueing Oral Cavity - Dentition: Adequate natural dentition (partial on bottom ) Self-Feeding Abilities: Able to feed self Patient Positioning: Upright in bed Baseline Vocal Quality: Clear Volitional Cough: Strong Volitional Swallow: Able to elicit    Oral/Motor/Sensory Function Overall Oral Motor/Sensory Function: Appears within functional limits for tasks assessed   Ice Chips Ice chips:  Within functional limits Presentation: Self Fed   Thin Liquid Thin Liquid: Within functional limits Presentation: Cup;Spoon    Nectar Thick Nectar Thick Liquid: Not tested   Honey Thick Honey Thick Liquid: Not tested   Puree Puree: Within functional limits Presentation: Self Fed   Solid   GO    Solid: Within functional limits Presentation: Self Lorretta Harp MS, CCC-SLP [phone] [facility] 06/09/2013,10:46 AM

## 2013-06-10 LAB — BASIC METABOLIC PANEL
CO2: 24 mEq/L (ref 19–32)
Calcium: 8.6 mg/dL (ref 8.4–10.5)
Chloride: 90 mEq/L — ABNORMAL LOW (ref 96–112)
GFR calc Af Amer: >90 mL/min (ref >90)
GFR calc non Af Amer: 80 mL/min — ABNORMAL LOW (ref >90)
Potassium: 3.5 mEq/L (ref 3.5–5.1)
Sodium: 125 mEq/L — ABNORMAL LOW (ref 135–145)

## 2013-06-10 LAB — GLUCOSE, CAPILLARY
Glucose-Capillary: 135 mg/dL — ABNORMAL HIGH (ref 70–99)
Glucose-Capillary: 169 mg/dL — ABNORMAL HIGH (ref 70–99)

## 2013-06-10 LAB — CBC
MCH: 29.3 pg (ref 26.0–34.0)
MCHC: 36.4 g/dL — ABNORMAL HIGH (ref 30.0–36.0)
MCV: 80.4 fL (ref 78.0–100.0)
Platelets: 144 10*3/uL — ABNORMAL LOW (ref 150–400)
RBC: 3.21 MIL/uL — ABNORMAL LOW (ref 4.22–5.81)

## 2013-06-10 LAB — LEGIONELLA ANTIGEN, URINE: Legionella Antigen, Urine: NEGATIVE

## 2013-06-10 NOTE — Consult Note (Signed)
Subjective:   HPI  The patient is an 79 year old male with multiple medical problems who we are asked to see in regards to low-volume rectal bleeding which according to him started after coming into the hospital. He does have a history of prostate cancer and has been treated with radiation therapy in the past. He recalls that about 10 years ago he was having rectal bleeding and was sent to [facility] where they did some kind of cauterization. He states that he has hemorrhoids. He states his stools are brown but he sees blood.  Review of Systems No current complaints of chest pain or shortness of breath  Past Medical History  Diagnosis Date  . Aortic stenosis   . High cholesterol   . COPD (chronic obstructive pulmonary disease)   . Retinal artery occlusion   . Emphysema   . GERD (gastroesophageal reflux disease)   . Coronary artery disease   . Heart murmur   . On home oxygen therapy     "2L all the time" (01/02/2013)  . Angina   . Hypertension   . CHF (congestive heart failure)   . MI (myocardial infarction)     "I've had several; last one was 03/09/2012" (01/02/2013  . Asthma   . Pneumonia     "today; have had it at least twice before" (01/02/2013)  . Shortness of breath     "most of the time recently" (01/02/2013)  . Chronic bronchitis   . Type II diabetes mellitus   . History of stomach ulcers     "have them now; they flare up when I eat the wrong thing or don't eat" (01/02/2013)  . Hepatitis     "don't know which one" (01/02/2013)  . WJXBJYNW(295.6)     "probably weekly" (01/02/2013)  . Stroke     "several; left ankle/foot still weak from one of them" (01/02/2013)  . Arthritis     "all over" (01/02/2013)  . DJD (degenerative joint disease)     "all over" (01/02/2013)  . Chronic lower back pain   . Urinary urgency   . Prostate cancer     "had radiation treatments" (01/02/2013)  . Basal cell carcinoma     "left nose, behind right knee" (01/02/2013)  . Anemia     chronic/notes 01/02/2013   Past  Surgical History  Procedure Laterality Date  . Shoulder arthroscopy w/ rotator cuff repair Right 1990's; 2000's    "they've operated on it twice" (01/02/2013)  . Sp pta peripheral      Bilateral leg stents  . Inguinal hernia repair Bilateral     "1 wk apart" (01/02/2013)  . Coronary stent placement      "I've had 7 or 8 stents put in my heart" (01/02/2013)  . Cardiac catheterization      "couldn't get new type of stents put in" (01/02/2013)  . Coronary angioplasty with stent placement      "I've had 7 or 8 stents put in my heart; last stents put in ~ 2 wk ago" (01/02/2013)  . Back surgery    . Anterior fusion cervical spine      "C5-7; took bone off my right hip" (5-01/2013)   History   Social History  . Marital Status: Single    Spouse Name: N/A    Number of Children: N/A  . Years of Education: N/A   Occupational History  . Not on file.   Social History Main Topics  . Smoking status: Never Smoker   . Smokeless  tobacco: Former User    Types: Chew     Comment: 01/02/2013 "chewed in high school playing ball"  . Alcohol Use: No  . Drug Use: No  . Sexual Activity: No   Other Topics Concern  . Not on file   Social History Narrative  . No narrative on file   family history includes Coronary artery disease in his sister; Coronary artery disease (age of onset: 44) in his brother. Current facility-administered medications:0.9 %  sodium chloride infusion, , Intravenous, Continuous, Manuela Schwartz, MD, Last Rate: 50 mL/hr at 06/10/13 1700;  albuterol (PROVENTIL HFA;VENTOLIN HFA) 108 (90 BASE) MCG/ACT inhaler 2 puff, 2 puff, Inhalation, Q6H PRN, Judie Bonus, MD;  albuterol (PROVENTIL) (5 MG/ML) 0.5% nebulizer solution 2.5 mg, 2.5 mg, Nebulization, Q6H PRN, Judie Bonus, MD aspirin chewable tablet 81 mg, 81 mg, Oral, Daily, Judie Bonus, MD, 81 mg at 06/10/13 1044;  atorvastatin (LIPITOR) tablet 80 mg, 80 mg, Oral, QHS, Judie Bonus, MD, 80 mg at 06/09/13 2149;   fluticasone (FLOVENT HFA) 44 MCG/ACT inhaler 2 puff, 2 puff, Inhalation, BID, Judie Bonus, MD, 2 puff at 06/09/13 1938;  gabapentin (NEURONTIN) capsule 300 mg, 300 mg, Oral, QHS, Judie Bonus, MD, 300 mg at 06/09/13 2149 insulin aspart (novoLOG) injection 0-9 Units, 0-9 Units, Subcutaneous, TID WC, Judie Bonus, MD, 2 Units at 06/10/13 1731;  ipratropium (ATROVENT) nebulizer solution 0.5 mg, 0.5 mg, Nebulization, Q6H PRN, Judie Bonus, MD;  latanoprost (XALATAN) 0.005 % ophthalmic solution 1 drop, 1 drop, Both Eyes, QHS, Judie Bonus, MD, 1 drop at 06/09/13 2152 metoprolol tartrate (LOPRESSOR) tablet 25 mg, 25 mg, Oral, BID, Judie Bonus, MD, 25 mg at 06/10/13 1041;  morphine 2 MG/ML injection 1-2 mg, 1-2 mg, Intravenous, Q3H PRN, Judie Bonus, MD;  nitroGLYCERIN (NITROSTAT) SL tablet 0.4 mg, 0.4 mg, Sublingual, Q5 min PRN, Judie Bonus, MD;  pantoprazole (PROTONIX) EC tablet 40 mg, 40 mg, Oral, Daily, Judie Bonus, MD, 40 mg at 06/10/13 1041 piperacillin-tazobactam (ZOSYN) IVPB 3.375 g, 3.375 g, Intravenous, Q8H, Alejandro Paya, DO, 3.375 g at 06/10/13 1731;  polyvinyl alcohol (LIQUIFILM TEARS) 1.4 % ophthalmic solution 1 drop, 1 drop, Both Eyes, PRN, Judie Bonus, MD;  ranolazine (RANEXA) 12 hr tablet 500 mg, 500 mg, Oral, BID, Judie Bonus, MD, 500 mg at 06/10/13 1041 Rivaroxaban (XARELTO) tablet 20 mg, 20 mg, Oral, Q supper, Judie Bonus, MD, 20 mg at 06/10/13 1731;  tamsulosin (FLOMAX) capsule 0.4 mg, 0.4 mg, Oral, QPC supper, Judie Bonus, MD, 0.4 mg at 06/10/13 1800;  Ticagrelor (BRILINTA) tablet 90 mg, 90 mg, Oral, BID, Judie Bonus, MD, 90 mg at 06/10/13 1041 vancomycin (VANCOCIN) IVPB 1000 mg/200 mL premix, 1,000 mg, Intravenous, Q12H, Colleen Can, RPH, 1,000 mg at 06/10/13 1044 Allergies  Allergen Reactions  . Iodine Shortness Of Breath  . Iohexol      Code: SOB, Desc: ivp dye, iodine, Onset Date:  16109604      Objective:     BP 169/52  Pulse 83  Temp(Src) 99.2 F (37.3 C) (Oral)  Resp 22  Ht 5\' 9"  (1.753 m)  Wt 72.6 kg (160 lb 0.9 oz)  BMI 23.63 kg/m2  SpO2 94%  He is in no distress  Nonicteric  Heart regular  Lungs decreased breath sounds  Abdomen: Soft and nontender without masses    Laboratory No components found with this basename: d1      Assessment:  Low-volume rectal bleeding. The patient is on Xeralto. The etiology of the rectal bleeding most likely is either from radiation proctitis or hemorrhoidal.      Plan:     Consider doing flexible sigmoidoscopy to further evaluate versus empiric treatment with Anusol HC suppositories or Canasa suppository. Further discussions with the patient, family, and primary team.  Lab Results  Component Value Date   HGB 9.4* 06/10/2013   HGB 9.8* 06/09/2013   HGB 9.4* 06/09/2013   HCT 25.8* 06/10/2013   HCT 27.6* 06/09/2013   HCT 25.8* 06/09/2013   ALKPHOS 63 06/09/2013   ALKPHOS 54 06/08/2013   ALKPHOS 92 05/18/2013   AST 36 06/09/2013   AST 52* 06/08/2013   AST 16 05/18/2013   ALT 20 06/09/2013   ALT 22 06/08/2013   ALT 17 05/18/2013

## 2013-06-10 NOTE — Progress Notes (Signed)
Speech Language Pathology Treatment: Dysphagia  Patient Details Name: Stephfon Bovey MRN: [id number] DOB: Jul 23, 1927 Today's Date: 06/10/2013 Time: 7829-5621 SLP Time Calculation (min): 15 min  Assessment / Plan / Recommendation Clinical Impression F/u from BSE completed on 06/09/13 for diet tolerance of regular consistency and thin liquids.  Observed directly with thin liquids by cup with continued delayed dry cough indicating possible penetration vs. reflux.  Patient demonstrates  understanding of recommended swallow strategies.  Continue current diet consistency and swallow strategies. Recommend continued Skilled ST Treatment  in Acute Care.  Due to continued s/s of suspected penetration vs. Aspiration, PMH significant for PNA and stroke recommend to proceed with objective evaluation of MBS to assess risk for aspiration.  MBS to be completed on 06/11/13.             SLP Plan  Continue with current diet and swallow strategies.  Recommend objective evaluation to assess risk for aspiration.    Recommendations Diet recommendations: Regular Liquids provided via: Cup;No straw Medication Administration: Whole meds with liquid Supervision: Patient able to self feed Compensations: Slow rate;Small sips/bites;Follow solids with liquid;Multiple dry swallows after each bite/sip Postural Changes and/or Swallow Maneuvers: Out of bed for meals;Seated upright 90 degrees;Upright 30-60 min after meal              Oral Care Recommendations: Oral care Q4 per protocol Follow up Recommendations: Skilled Nursing facility Plan: Continue with current plan of care    GO    Moreen Fowler MS, CCC-SLP [phone] [facility] 06/10/2013, 1:15 PM

## 2013-06-10 NOTE — Progress Notes (Addendum)
Subjective: Had some rectal bleeding associated when using the bedside commode.  Pt states that he had "something cauterized at [facility]" over a decade ago for prior bleeding and "prostate problems because the [place] didn't have the equipment". Otherwise no acute events overnight. Pt w/o complaints and agreeable to SNF placement.   Objective: Vital signs in last 24 hours: Filed Vitals:   06/09/13 2300 06/09/13 2340 06/10/13 0316 06/10/13 0800  BP: 153/50  111/44 115/64  Pulse: 70 66 83   Temp:  98.6 F (37 C) 98.3 F (36.8 C) 98.6 F (37 C)  TempSrc:  Oral Oral Oral  Resp: 20 20 22    Height:      Weight:   160 lb 0.9 oz (72.6 kg)   SpO2: 97% 96% 91%    Weight change: -11 lb 15.1 oz (-5.419 kg)  Intake/Output Summary (Last 24 hours) at 06/10/13 0923 Last data filed at 06/10/13 0600  Gross per 24 hour  Intake   1690 ml  Output   1000 ml  Net    690 ml   Physical Exam:  General: asleep in bedside recliner, easily arousable HEENT: NCAT, vision grossly intact, oropharynx clear and non-erythematous, moist mucus membranes Neck: supple, no lymphadenopathy appreciated Lungs: CTA bilaterally, no crackles appreciated today Heart: regular rate and rhythm, + SEM murmur best heard at LUSB, with  radiation to carotids, no rubs or gallops appreciated Abdomen: soft, non-tender, non-distended, normal bowel sounds  Extremities: trace pre-tibial edema bilaterally, distal pulses intact bilaterally Neurologic: grossly non-focal, alert & oriented, strength grossly intact, sensation intact to light touch Psych: appropriate and cooperative  Lab Results: Basic Metabolic Panel:  Recent Labs Lab 06/09/13 0555 06/10/13 0330  NA 126* 125*  K 3.6 3.5  CL 92* 90*  CO2 26 24  GLUCOSE 131* 135*  BUN 11 9  CREATININE 0.73 0.81  CALCIUM 9.1 8.6   Liver Function Tests:  Recent Labs Lab 06/08/13 2016 06/09/13 0555  AST 52* 36  ALT 22 20  ALKPHOS 54 63  BILITOT 1.0 0.9  PROT 5.9* 5.6*    ALBUMIN 2.7* 2.6*   CBC:  Recent Labs Lab 06/08/13 2016  06/09/13 1235 06/10/13 0330  WBC 14.3*  < > 11.7* 10.0  NEUTROABS 12.5*  --   --   --   HGB 10.3*  < > 9.8* 9.4*  HCT 28.5*  < > 27.6* 25.8*  MCV 80.5  < > 80.5 80.4  PLT 140*  < > 137* 144*  < > = values in this interval not displayed. Cardiac Enzymes:  Recent Labs Lab 06/08/13 2016 06/09/13 0015 06/09/13 0600 06/09/13 1235  CKTOTAL 1281*  --   --   --   CKMB 10.7*  --   --   --   TROPONINI  --  1.56* 1.08* 0.99*   BNP:  Recent Labs Lab 06/08/13 2226  PROBNP 4358.0*   CBG:  Recent Labs Lab 06/09/13 0058 06/09/13 0742 06/09/13 1138 06/09/13 1707 06/09/13 2154 06/10/13 0809  GLUCAP 148* 135* 123* 146* 162* 135*   Fasting Lipid Panel:  Recent Labs Lab 06/09/13 0600  CHOL 179  HDL 55  LDLCALC 105*  TRIG 93  CHOLHDL 3.3   Coagulation:  Recent Labs Lab 06/08/13 2225  LABPROT 31.1*  INR 3.14*   Urinalysis:  Recent Labs Lab 06/08/13 2232  COLORURINE YELLOW  LABSPEC 1.017  PHURINE 7.5  GLUCOSEU NEGATIVE  HGBUR NEGATIVE  BILIRUBINUR NEGATIVE  KETONESUR NEGATIVE  PROTEINUR 30*  UROBILINOGEN 1.0  NITRITE NEGATIVE  LEUKOCYTESUR NEGATIVE    Micro Results: Recent Results (from the past 240 hour(s))  MRSA PCR SCREENING     Status: None   Collection Time    06/09/13  1:02 AM      Result Value Range Status   MRSA by PCR NEGATIVE  NEGATIVE Final   Comment:            The GeneXpert MRSA Assay (FDA     approved for NASAL specimens     only), is one component of a     comprehensive MRSA colonization     surveillance program. It is not     intended to diagnose MRSA     infection nor to guide or     monitor treatment for     MRSA infections.   Studies/Results: Dg Pelvis 1-2 Views  06/08/2013   CLINICAL DATA:  Larey Seat. Weakness.  EXAM: PELVIS - 1-2 VIEW  COMPARISON:  Abdomen radiographs dated 09/30/2010.  FINDINGS: No fracture dislocation. Arterial calcifications. Bilateral common  iliac stents.  IMPRESSION: No fracture or dislocation.   Electronically Signed   By: Gordan Payment M.D.   On: 06/08/2013 21:39   Ct Head Wo Contrast  06/08/2013   *RADIOLOGY REPORT*  Clinical Data:  Status post fall.  Concern for head or cervical spine injury.  CT HEAD WITHOUT CONTRAST AND CT CERVICAL SPINE WITHOUT CONTRAST  Technique:  Multidetector CT imaging of the head and cervical spine was performed following the standard protocol without intravenous contrast.  Multiplanar CT image reconstructions of the cervical spine were also generated.  Comparison: CT of the head the cervical spine performed 04/27/2010, and MRI of the brain performed 05/18/2013  CT HEAD  Findings: There is no evidence of acute infarction, mass lesion, or intra- or extra-axial hemorrhage on CT.  Prominence of the ventricles and sulci reflects mild cortical volume loss.  Scattered periventricular white matter change likely reflects small vessel ischemic microangiopathy.  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  The brainstem and fourth ventricle are within normal limits.  The basal ganglia are unremarkable in appearance.  The cerebral hemispheres demonstrate grossly normal gray-white differentiation. No mass effect or midline shift is seen.  There is no evidence of fracture; visualized osseous structures are unremarkable in appearance.  The orbits are within normal limits. The paranasal sinuses and mastoid air cells are well-aerated.  No significant soft tissue abnormalities are seen.  IMPRESSION:  1.  No evidence of traumatic intracranial injury or fracture. 2.  Mild cortical volume loss and scattered small vessel ischemic microangiopathy. 3.  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  CT CERVICAL SPINE  Findings: There is no evidence of fracture or subluxation. Vertebral bodies  demonstrate normal height and alignment.  There is mild narrowing of the intervertebral disc space at C3-C4.  There is chronic osseous fusion at C5-C7.  Small posterior disc osteophyte complexes are noted along the cervical spine.  Prevertebral soft tissues are within normal limits.  The visualized portions of the thyroid gland are unremarkable in appearance.  The visualized lung apices are clear.  Dense calcification is noted at the carotid bifurcations bilaterally.  IMPRESSION:  1.  No evidence of fracture or subluxation along the cervical spine. 2.  Minimal degenerative change along the cervical spine, with chronic osseous fusion at C5-C7. 3.  Dense calcification at the  carotid bifurcations bilaterally. Carotid ultrasound would be helpful for further evaluation, on an elective non-emergent basis.   Original Report Authenticated By: Tonia Ghent, M.D.   Ct Cervical Spine Wo Contrast  06/08/2013   *RADIOLOGY REPORT*  Clinical Data:  Status post fall.  Concern for head or cervical spine injury.  CT HEAD WITHOUT CONTRAST AND CT CERVICAL SPINE WITHOUT CONTRAST  Technique:  Multidetector CT imaging of the head and cervical spine was performed following the standard protocol without intravenous contrast.  Multiplanar CT image reconstructions of the cervical spine were also generated.  Comparison: CT of the head the cervical spine performed 04/27/2010, and MRI of the brain performed 05/18/2013  CT HEAD  Findings: There is no evidence of acute infarction, mass lesion, or intra- or extra-axial hemorrhage on CT.  Prominence of the ventricles and sulci reflects mild cortical volume loss.  Scattered periventricular white matter change likely reflects small vessel ischemic microangiopathy.  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  The brainstem and fourth ventricle are within normal limits.  The basal ganglia are unremarkable in  appearance.  The cerebral hemispheres demonstrate grossly normal gray-white differentiation. No mass effect or midline shift is seen.  There is no evidence of fracture; visualized osseous structures are unremarkable in appearance.  The orbits are within normal limits. The paranasal sinuses and mastoid air cells are well-aerated.  No significant soft tissue abnormalities are seen.  IMPRESSION:  1.  No evidence of traumatic intracranial injury or fracture. 2.  Mild cortical volume loss and scattered small vessel ischemic microangiopathy. 3.  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  CT CERVICAL SPINE  Findings: There is no evidence of fracture or subluxation. Vertebral bodies demonstrate normal height and alignment.  There is mild narrowing of the intervertebral disc space at C3-C4.  There is chronic osseous fusion at C5-C7.  Small posterior disc osteophyte complexes are noted along the cervical spine.  Prevertebral soft tissues are within normal limits.  The visualized portions of the thyroid gland are unremarkable in appearance.  The visualized lung apices are clear.  Dense calcification is noted at the carotid bifurcations bilaterally.  IMPRESSION:  1.  No evidence of fracture or subluxation along the cervical spine. 2.  Minimal degenerative change along the cervical spine, with chronic osseous fusion at C5-C7. 3.  Dense calcification at the carotid bifurcations bilaterally. Carotid ultrasound would be helpful for further evaluation, on an elective non-emergent basis.   Original Report Authenticated By: Tonia Ghent, M.D.   Dg Chest Portable 1 View  06/08/2013   CLINICAL DATA:  History of trauma from a fall complaining chest pain.  EXAM: PORTABLE CHEST - 1 VIEW  COMPARISON:  CHEST x-ray 04/07/2020 2014.  FINDINGS: New area of airspace consolidation in the left lower lobe partially obscuring the left hemidiaphragm. Probable small left  pleural effusion. Right lung appears clear. Mild diffuse peribronchial cuffing. Heart size appears borderline enlarged. No evidence of pulmonary edema. Mediastinal contours are unremarkable. Atherosclerosis in the thoracic aorta. No definite pneumothorax. Visualized portions of the bony thorax appears grossly intact.  IMPRESSION: 1. In the setting of recent trauma, the appearance of the left lower lobe is concerning for potential contusion, however, this would be unusual for a typical fall from a standing or seated position. If the patient fell from a significant height, this warrants consideration. Alternatively, this may reflect sequela of aspiration associated  with the trauma, or could simply indicate an underlying infection that pre-existed the prior trauma. Clinical correlation is recommended. There is a small left pleural effusion. 2. No pneumothorax or definite rib fractures identified. 3. Atherosclerosis.   Electronically Signed   By: Trudie Reed M.D.   On: 06/08/2013 20:36   Medications: I have reviewed the patient's current medications. Scheduled Meds: . aspirin  81 mg Oral Daily  . atorvastatin  80 mg Oral QHS  . fluticasone  2 puff Inhalation BID  . gabapentin  300 mg Oral QHS  . influenza vac split quadrivalent PF  0.5 mL Intramuscular Tomorrow-1000  . insulin aspart  0-9 Units Subcutaneous TID WC  . latanoprost  1 drop Both Eyes QHS  . metoprolol  25 mg Oral BID  . pantoprazole  40 mg Oral Daily  . piperacillin-tazobactam  3.375 g Intravenous Q8H  . ranolazine  500 mg Oral BID  . Rivaroxaban  20 mg Oral Q supper  . tamsulosin  0.4 mg Oral QPC supper  . Ticagrelor  90 mg Oral BID  . vancomycin  1,000 mg Intravenous Q12H   Continuous Infusions: . sodium chloride 50 mL (06/10/13 0540)   PRN Meds:.albuterol, albuterol, ipratropium, morphine injection, nitroGLYCERIN, polyvinyl alcohol  Assessment/Plan: HD#2 for Mr. Guzzo 79 yo admitted for syncopal episode and pneumonia in  setting of aortic stenosis and recent hospitalization for COPD exacerbation.  #1 Syncope/fall in setting of Aortic Stenosis - Unclear etiology may be secondary to progression of Aortic stenosis vs post-micturition syncope.  . Severe Aortic thickening and calcification on ECHO with moderate to severe stenosis; indicates progression in over past 2 years (1.44cm2 from 2.02 cm2), given age and co-morbidities intervention would be high risk. Carotid US preliminary report w/o significant stenosis. Telemetry w/o arrhythmia.  - Continue telemetry  - SNF placement pending   #HCAP - Afebrile without leukocytosis today, Day #2 of Vancomycin and Zosyn to cover for possible aspiration pneumonia. Strep negative. Legionella pending. SLP evaluation with low-risk for aspiration during oral intake. CXR showed left lower lobe infiltrate.   - cont Heart Healthy diet - cont Vancomycin and Zosyn with consideration for de-escalation tomorrow - Albuterol and ipratropium nebulizers q6h prn wheezing - Legionella urinary antigens pending   Recent Labs Lab 06/08/13 2016 06/09/13 0555 06/09/13 1235 06/10/13 0330  WBC 14.3* 12.0* 11.7* 10.0    # Bright Red Blood Per Rectum: associated with bowel movements on admission (streaks on stool), hemoccult positive, streaks of blood on sheets and under garments, nursing reports clots during bowel movement. Pt reports that he had "bandins" put on over a decade ago for likely hemorroids -consult GI for possible need for possible intervention  # Mildly elevated troponins - likely secondary to demand ischemia, trended down, no further work-up recommended - Cardiology consulted, appreciate recs  - Follow up echo - Will continue medical therapy (Brilinta, ASA, statin) - Will obtain records from [place] (especially from 12/2012)    Recent Labs Lab 06/09/13 0015 06/09/13 0600 06/09/13 1235  TROPONINI 1.56* 1.08* 0.99*    # diastolic CHF - grade 2 without exacerbation, EF 60-65%,  mod concentric hypertrophy with normal systolic fxn - Continue medical management with home meds (metoprolol, losartan, Ranolaxine for CAD, Atorvastatin)  - Daily weights, Is and Os  - TED hose   #COPD - Stable without exacerbation - Albuterol and ipratropium Nebulizers q6h prn wheezing   #DM Type2 with peripheral neuropathy - well-controlled with HgbA1c 6.5%. - CBGs q4h  -metformin on hold  in acute setting - SSI sensitive  - cont gabapentin for peripheral neuropathy  Lipid Panel     Component Value Date/Time   CHOL 179 06/09/2013 0600   TRIG 93 06/09/2013 0600   HDL 55 06/09/2013 0600   CHOLHDL 3.3 06/09/2013 0600   VLDL 19 06/09/2013 0600   LDLCALC 105* 06/09/2013 0600    #Hypertension - Stable and at goal - Cont metoprolol  #Hyponatremia - Chronic, near baseline (126-128). 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 - Continue to monitor - Will attempt to obtain records from [facility] re: possible syncope admission 2/2 hyponatremia  Recent Labs Lab 06/08/13 2016 06/09/13 0555 06/10/13 0330  NA 127* 126* 125*    #DVT PPX - On anticoagulation with Xarelto per above, no heparin or Lovenox indicated.  #Code status - DNR.   Dispo: This is Mr. Babe 3rd hospitalization on this year and 2nd within the past 6 months for HCAP. He will be discharged to SNF. Anticipated discharge in approximately 1-3 day(s).   The patient does have a current PCP [facility], MD) and does need an [facility] hospital follow-up appointment after discharge.  The patient does not have transportation limitations that hinder transportation to clinic appointments.  .Services Needed at time of discharge: Y = Yes, Blank = No PT: PT  OT:   RN:   Equipment:   Other:     LOS: 2 days   Manuela Schwartz, MD 06/10/2013, 9:23 AM

## 2013-06-11 ENCOUNTER — Inpatient Hospital Stay (HOSPITAL_COMMUNITY): Payer: Medicare Other

## 2013-06-11 DIAGNOSIS — I2510 Atherosclerotic heart disease of native coronary artery without angina pectoris: Secondary | ICD-10-CM

## 2013-06-11 DIAGNOSIS — I509 Heart failure, unspecified: Secondary | ICD-10-CM

## 2013-06-11 DIAGNOSIS — R55 Syncope and collapse: Secondary | ICD-10-CM

## 2013-06-11 DIAGNOSIS — I214 Non-ST elevation (NSTEMI) myocardial infarction: Secondary | ICD-10-CM

## 2013-06-11 LAB — BASIC METABOLIC PANEL
BUN: 9 mg/dL (ref 6–23)
CO2: 28 mEq/L (ref 19–32)
Calcium: 8.8 mg/dL (ref 8.4–10.5)
GFR calc Af Amer: 88 mL/min — ABNORMAL LOW (ref >90)
Glucose, Bld: 144 mg/dL — ABNORMAL HIGH (ref 70–99)
Sodium: 128 mEq/L — ABNORMAL LOW (ref 135–145)

## 2013-06-11 LAB — CBC
Hemoglobin: 8.1 g/dL — ABNORMAL LOW (ref 13.0–17.0)
MCH: 29 pg (ref 26.0–34.0)
MCHC: 35.7 g/dL (ref 30.0–36.0)
MCV: 81.4 fL (ref 78.0–100.0)
RBC: 2.79 MIL/uL — ABNORMAL LOW (ref 4.22–5.81)
RDW: 15.5 % (ref 11.5–15.5)

## 2013-06-11 LAB — GLUCOSE, CAPILLARY: Glucose-Capillary: 131 mg/dL — ABNORMAL HIGH (ref 70–99)

## 2013-06-11 MED ORDER — LEVOFLOXACIN 750 MG PO TABS: 750.0000 mg | ORAL_TABLET | Freq: Every day | ORAL | Status: AC

## 2013-06-11 MED ORDER — LEVOFLOXACIN 750 MG PO TABS: 750.0000 mg | ORAL_TABLET | Freq: Every day | ORAL | Status: DC

## 2013-06-11 NOTE — Plan of Care (Signed)
Information requested by primary team:  Patients doctors at [facility] hospital: Dr. Hardie Lora (primary)  2006: Dr Lina Sar performed colonoscopy at [facility]

## 2013-06-11 NOTE — Progress Notes (Signed)
Subjective: Denies dizziness  No CP  No SOB Objective: Filed Vitals:   06/11/13 0432 06/11/13 0700 06/11/13 0725 06/11/13 0800  BP: 116/46  141/51   Pulse:      Temp:   97.4 F (36.3 C)   TempSrc:   Oral   Resp: 20 15 17 20   Height:      Weight:      SpO2:   95% 91%   Weight change: -1 lb 12.2 oz (-0.8 kg)  Intake/Output Summary (Last 24 hours) at 06/11/13 1042 Last data filed at 06/11/13 0818  Gross per 24 hour  Intake   1600 ml  Output    950 ml  Net    650 ml    General: Alert, awake, oriented x3, in no acute distress Neck:  JVP is normal Heart: Regular rate and rhythm,GR III/VI systolic murmur, rubs, gallops.  Lungs: Clear to auscultation.  No rales or wheezes. Exemities:  No edema.   Neuro: Grossly intact, nonfocal.  Tele:  SR   Lab Results: Results for orders placed during the hospital encounter of 06/08/13 (from the past 24 hour(s))  GLUCOSE, CAPILLARY     Status: Abnormal   Collection Time    06/10/13 11:36 AM      Result Value Range   Glucose-Capillary 169 (*) 70 - 99 mg/dL  GLUCOSE, CAPILLARY     Status: Abnormal   Collection Time    06/10/13  4:54 PM      Result Value Range   Glucose-Capillary 160 (*) 70 - 99 mg/dL  GLUCOSE, CAPILLARY     Status: Abnormal   Collection Time    06/10/13  9:35 PM      Result Value Range   Glucose-Capillary 151 (*) 70 - 99 mg/dL  BASIC METABOLIC PANEL     Status: Abnormal   Collection Time    06/11/13  7:19 AM      Result Value Range   Sodium 128 (*) 135 - 145 mEq/L   Potassium 3.6  3.5 - 5.1 mEq/L   Chloride 94 (*) 96 - 112 mEq/L   CO2 28  19 - 32 mEq/L   Glucose, Bld 144 (*) 70 - 99 mg/dL   BUN 9  6 - 23 mg/dL   Creatinine, Ser 2.13  0.50 - 1.35 mg/dL   Calcium 8.8  8.4 - 08.6 mg/dL   GFR calc non Af Amer 76 (*) >90 mL/min   GFR calc Af Amer 88 (*) >90 mL/min  CBC     Status: Abnormal   Collection Time    06/11/13  7:19 AM      Result Value Range   WBC 5.9  4.0 - 10.5 K/uL   RBC 2.79 (*) 4.22 - 5.81 MIL/uL    Hemoglobin 8.1 (*) 13.0 - 17.0 g/dL   HCT 57.8 (*) 46.9 - 62.9 %   MCV 81.4  78.0 - 100.0 fL   MCH 29.0  26.0 - 34.0 pg   MCHC 35.7  30.0 - 36.0 g/dL   RDW 52.8  41.3 - 24.4 %   Platelets 140 (*) 150 - 400 K/uL  GLUCOSE, CAPILLARY     Status: Abnormal   Collection Time    06/11/13  8:00 AM      Result Value Range   Glucose-Capillary 131 (*) 70 - 99 mg/dL    Studies/Results: @RISRSLT24 @   Medications: Reviewed   @PROBHOSP @  1.  Syncope patient denied prodrome prior to spell.  Concerning  I am  not convinced it is a primary ischemic event.  May be related to AS  I have reviewed echo  It appears at least moderate.  Sats are OK on RA  ON anticoagulant  Daughter reports cardiac cath 1 year ago (? Exact time)  Then when d/c'd with dx of pneumonia.  After that had PE  Placed on this at [facility]  Seen recently. He was d/cd from [place] on Xarelto Jan 24 2013.  I am unable to reach Dr Jabier Mutton as it is a federal holiday.  She states they do not want anything aggressive for father.    I would recomm continuing current medical care but for his safety stop Xarelto  He has completed almost 5 months of Rx  Appears to have been a provoked event.  With syncope use of this with Brilinta and ASA is risky.  Informed daughter. He needs f/u at [facility]  2.  AS  I have reviewed echo  AS is now moderate at least.  May have been involved in #1 if he were dry, orthostatic initally.  Daughter said they do not want anything aggressive being done.  Would follow clinically for now  Should have f/u at [facility] where interventions done in past Avoid diuretics  3.  CAD  Minimal trop bump  Prob from subendocardial ischemia in setting of syncope  He does have LVH.   I would follow.  No evid for ongoing ischemia.    Patient's cardiologist is at [facility] is Ebony Cargo [phone])  I will attempt to contact.  4.  Afib  Dx in previuos H and P .  I do not see any EKGs with afib  Again, risk outweighs benefits with  anticoagulation.    I have reviewed all of the above with daughter who has POA. She is aware that he has multiple medical issues, that there is not perfect answer.  She has relayed the thought that they do not want aggressive measures done.   I think it is reasonable to f/u at [facility] for care.  Will continue to call to forward history. OK to d/c to SNF.  LOS: 3 days   Dietrich Pates 06/11/2013, 10:42 AM

## 2013-06-11 NOTE — Discharge Summary (Signed)
Name: Eugene Bauer MRN: [id number] DOB: 05-Feb-1927 79 y.o. PCP: Dellia Nims, MD  Date of Admission: 06/08/2013  7:33 PM Date of Discharge: 06/11/2013 Attending Physician: Cliffton Asters, MD  Discharge Diagnosis:  1. Syncope/fall in setting of aortic stenosis 2. HCAP 3. Bright red blood per rectum 4. Mildly elevated troponins 5. Diastolic CHF 6. COPD 7. DM Type2 with peripheral neuropathy 8. Hypertension 9. Hyponatremia  Discharge Medications:   Medication List    STOP taking these medications       Rivaroxaban 20 MG Tabs tablet  Commonly known as:  XARELTO      TAKE these medications       acetaminophen 500 MG tablet  Commonly known as:  TYLENOL  Take 500 mg by mouth every 6 (six) hours as needed for pain.     albuterol (5 MG/ML) 0.5% nebulizer solution  Commonly known as:  PROVENTIL  Take 0.5 mLs (2.5 mg total) by nebulization every 6 (six) hours as needed for wheezing or shortness of breath.     albuterol 108 (90 BASE) MCG/ACT inhaler  Commonly known as:  PROVENTIL HFA;VENTOLIN HFA  Inhale 2 puffs into the lungs every 6 (six) hours as needed for wheezing.     aspirin 81 MG tablet  Take 81 mg by mouth daily.     atorvastatin 80 MG tablet  Commonly known as:  LIPITOR  Take 80 mg by mouth at bedtime.     ferrous sulfate 325 (65 FE) MG tablet  Take 650 mg by mouth every evening.     gabapentin 300 MG capsule  Commonly known as:  NEURONTIN  Take 300 mg by mouth at bedtime.     ipratropium 0.02 % nebulizer solution  Commonly known as:  ATROVENT  Take 2.5 mLs (0.5 mg total) by nebulization every 6 (six) hours as needed.     levofloxacin 750 MG tablet  Commonly known as:  LEVAQUIN  Take 1 tablet (750 mg total) by mouth daily with breakfast.  Start taking on:  06/12/2013     metFORMIN 500 MG tablet  Commonly known as:  GLUCOPHAGE  Take 500 mg by mouth 2 (two) times daily with a meal.     metoprolol 50 MG tablet  Commonly known as:  LOPRESSOR    Take 25 mg by mouth 2 (two) times daily.     mometasone 220 MCG/INH inhaler  Commonly known as:  ASMANEX  Inhale 2 puffs into the lungs at bedtime.     nitroGLYCERIN 0.4 MG SL tablet  Commonly known as:  NITROSTAT  Place 0.4 mg under the tongue every 5 (five) minutes as needed for chest pain.     omeprazole 40 MG capsule  Commonly known as:  PRILOSEC  Take 1 capsule (40 mg total) by mouth 2 (two) times daily before a meal.     polyvinyl alcohol 1.4 % ophthalmic solution  Commonly known as:  LIQUIFILM TEARS  Place 1 drop into both eyes as needed.     predniSONE 20 MG tablet  Commonly known as:  DELTASONE  Take 2 tablets (40 mg total) by mouth daily.     ranolazine 500 MG 12 hr tablet  Commonly known as:  RANEXA  Take 500 mg by mouth 2 (two) times daily.     tamsulosin 0.4 MG Caps capsule  Commonly known as:  FLOMAX  Take 0.4 mg by mouth daily after supper.     Ticagrelor 90 MG Tabs tablet  Commonly known as:  BRILINTA  Take 90 mg by mouth 2 (two) times daily.     Travoprost (BAK Free) 0.004 % Soln ophthalmic solution  Commonly known as:  TRAVATAN  Place 1 drop into both eyes at bedtime.        Disposition and follow-up:   Eugene Bauer was discharged from [facility] in Stable condition.  At the hospital follow up visit please address:  1.  We stopped Xarelto this admission. He has completed almost 5 months of therapy, and his DVT appears to have been a provoked event. With recent syncope, use of this with Brilinta and ASA is too risky. He also had some some GI bleeding this admission, making the risks even higher. Daughter is aware and we recommend close follow up at the [facility] with both his PCP and cardiologist.  2.  Labs / imaging needed at time of follow-up: CBC  3.  Pending labs/ test needing follow-up: None  Follow-up Appointments: Follow-up Information   Follow up with Graylin Shiver, MD On 06/26/2013. (@3 :00pm. This is your GI doctor.)     Specialty:  Gastroenterology   Contact information:   [address] [place] [address] [phone]       Schedule an appointment as soon as possible for a visit with Dellia Nims, MD.   Specialty:  Internal Medicine   Contact information:   [address] [place] [address] [phone]       Discharge Instructions: Discharge Orders   Future Orders Complete By Expires   Diet - low sodium heart healthy  As directed    Increase activity slowly  As directed       Consultations: Treatment Team:  Graylin Shiver, MD [facility], MD  Procedures Performed:  Dg Chest 2 View (if Patient Has Fever And/or Copd)  05/18/2013   CLINICAL DATA:  Shortness of Breath. Fatigue.  EXAM: CHEST  2 VIEW  COMPARISON:  05/17/2013, 01/02/2013 and 06/15/2012.  FINDINGS: Central pulmonary vascular prominence stable.  Calcified aorta.  Heart size top normal with coronary artery calcifications noted.  No infiltrate, congestive heart failure or pneumothorax.  No plain film evidence of pulmonary malignancy.  IMPRESSION: No acute abnormality. Please see above.   Electronically Signed   By: Bridgett Larsson   On: 05/18/2013 11:06   Dg Chest 2 View  05/17/2013   CLINICAL DATA:  Coughing and history of pneumonia.  EXAM: CHEST  2 VIEW  COMPARISON:  01/02/2013  FINDINGS: Two views of the chest demonstrate clear lungs. There is no focal airspace disease and no evidence for pulmonary edema. Bony thorax is intact. Heart and mediastinum are within normal limits.  IMPRESSION: No acute cardiopulmonary disease.   Electronically Signed   By: Richarda Overlie M.D.   On: 05/17/2013 20:42   Dg Pelvis 1-2 Views  06/08/2013   CLINICAL DATA:  Larey Seat. Weakness.  EXAM: PELVIS - 1-2 VIEW  COMPARISON:  Abdomen radiographs dated 09/30/2010.  FINDINGS: No fracture dislocation. Arterial calcifications. Bilateral common iliac stents.  IMPRESSION: No fracture or dislocation.   Electronically Signed   By: Gordan Payment M.D.    On: 06/08/2013 21:39   Ct Head Wo Contrast  06/08/2013   *RADIOLOGY REPORT*  Clinical Data:  Status post fall.  Concern for head or cervical spine injury.  CT HEAD WITHOUT CONTRAST AND CT CERVICAL SPINE WITHOUT CONTRAST  Technique:  Multidetector CT imaging of the head and cervical spine was performed following the standard protocol without  intravenous contrast.  Multiplanar CT image reconstructions of the cervical spine were also generated.  Comparison: CT of the head the cervical spine performed 04/27/2010, and MRI of the brain performed 05/18/2013  CT HEAD  Findings: There is no evidence of acute infarction, mass lesion, or intra- or extra-axial hemorrhage on CT.  Prominence of the ventricles and sulci reflects mild cortical volume loss.  Scattered periventricular white matter change likely reflects small vessel ischemic microangiopathy.  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  The brainstem and fourth ventricle are within normal limits.  The basal ganglia are unremarkable in appearance.  The cerebral hemispheres demonstrate grossly normal gray-white differentiation. No mass effect or midline shift is seen.  There is no evidence of fracture; visualized osseous structures are unremarkable in appearance.  The orbits are within normal limits. The paranasal sinuses and mastoid air cells are well-aerated.  No significant soft tissue abnormalities are seen.  IMPRESSION:  1.  No evidence of traumatic intracranial injury or fracture. 2.  Mild cortical volume loss and scattered small vessel ischemic microangiopathy. 3.  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  CT CERVICAL SPINE  Findings: There is no evidence of fracture or subluxation. Vertebral bodies demonstrate normal height and alignment.  There is mild narrowing of the intervertebral disc space at  C3-C4.  There is chronic osseous fusion at C5-C7.  Small posterior disc osteophyte complexes are noted along the cervical spine.  Prevertebral soft tissues are within normal limits.  The visualized portions of the thyroid gland are unremarkable in appearance.  The visualized lung apices are clear.  Dense calcification is noted at the carotid bifurcations bilaterally.  IMPRESSION:  1.  No evidence of fracture or subluxation along the cervical spine. 2.  Minimal degenerative change along the cervical spine, with chronic osseous fusion at C5-C7. 3.  Dense calcification at the carotid bifurcations bilaterally. Carotid ultrasound would be helpful for further evaluation, on an elective non-emergent basis.   Original Report Authenticated By: Tonia Ghent, M.D.   Ct Cervical Spine Wo Contrast  06/08/2013   *RADIOLOGY REPORT*  Clinical Data:  Status post fall.  Concern for head or cervical spine injury.  CT HEAD WITHOUT CONTRAST AND CT CERVICAL SPINE WITHOUT CONTRAST  Technique:  Multidetector CT imaging of the head and cervical spine was performed following the standard protocol without intravenous contrast.  Multiplanar CT image reconstructions of the cervical spine were also generated.  Comparison: CT of the head the cervical spine performed 04/27/2010, and MRI of the brain performed 05/18/2013  CT HEAD  Findings: There is no evidence of acute infarction, mass lesion, or intra- or extra-axial hemorrhage on CT.  Prominence of the ventricles and sulci reflects mild cortical volume loss.  Scattered periventricular white matter change likely reflects small vessel ischemic microangiopathy.  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  The brainstem and fourth ventricle are within normal limits.  The basal ganglia are unremarkable in appearance.  The cerebral hemispheres demonstrate grossly normal gray-white differentiation. No mass effect  or midline shift is seen.  There is no evidence of fracture; visualized osseous structures are unremarkable in appearance.  The orbits are within normal limits. The paranasal sinuses and mastoid air cells are well-aerated.  No significant soft tissue abnormalities are seen.  IMPRESSION:  1.  No evidence of traumatic intracranial  injury or fracture. 2.  Mild cortical volume loss and scattered small vessel ischemic microangiopathy. 3.  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  CT CERVICAL SPINE  Findings: There is no evidence of fracture or subluxation. Vertebral bodies demonstrate normal height and alignment.  There is mild narrowing of the intervertebral disc space at C3-C4.  There is chronic osseous fusion at C5-C7.  Small posterior disc osteophyte complexes are noted along the cervical spine.  Prevertebral soft tissues are within normal limits.  The visualized portions of the thyroid gland are unremarkable in appearance.  The visualized lung apices are clear.  Dense calcification is noted at the carotid bifurcations bilaterally.  IMPRESSION:  1.  No evidence of fracture or subluxation along the cervical spine. 2.  Minimal degenerative change along the cervical spine, with chronic osseous fusion at C5-C7. 3.  Dense calcification at the carotid bifurcations bilaterally. Carotid ultrasound would be helpful for further evaluation, on an elective non-emergent basis.   Original Report Authenticated By: Tonia Ghent, M.D.   Eugene Bauer Wo Contrast  05/18/2013   *RADIOLOGY REPORT*  Clinical Data: Evaluate for stroke  MRI HEAD WITHOUT AND WITH CONTRAST  Technique:  Multiplanar, multiecho pulse sequences of the brain and surrounding structures were obtained according to standard protocol without and with intravenous contrast  Contrast: 15mL MULTIHANCE GADOBENATE DIMEGLUMINE 529 MG/ML IV SOLN  Comparison: Prior CT from 09/29/2010.  Findings:  Diffuse generalized atrophy is present.  Scattered T2 / FLAIR hyperintense foci within the periventricular deep white matter are most consistent with chronic microvascular ischemic disease. FLAIR signal with encephalomalacia within the high right frontal lobe near the vertex is consistent with remote infarct in this region.  No diffusion weighted signal abnormalities identified to suggest acute intracranial infarct.  There is no acute intracranial hemorrhage.  No midline shift or mass lesion.  No hydrocephalus.  There is no extra-axial fluid collection.  Pituitary gland is normal.  Pituitary stalk is midline.  Optic nerves and optic chiasm are normal.  Craniocervical junction is within normal limits.  Corpus callosum is intact.  Calvarium and scalp soft tissues are normal.  No abnormal enhancement is seen within the brain on post contrast sequences.  There is mucosal enhancement with air fluid level within the right sphenoid sinus. Right mastoid effusion is present.  IMPRESSION: 1. No acute intracranial infarct. 2. Encephalomalacia with hyperintense T2/FLAIR signal within the high right frontal lobe near the vertex, consistent with remote ischemic infarct. 3.  Atrophy with microvascular ischemic changes. 4.  Right mastoid effusion. 5.  Right sphenoid sinus disease.   Original Report Authenticated By: Rise Mu, M.D.   Dg Chest Portable 1 View  06/08/2013   CLINICAL DATA:  History of trauma from a fall complaining chest pain.  EXAM: PORTABLE CHEST - 1 VIEW  COMPARISON:  CHEST x-ray 04/07/2020 2014.  FINDINGS: New area of airspace consolidation in the left lower lobe partially obscuring the left hemidiaphragm. Probable small left pleural effusion. Right lung appears clear. Mild diffuse peribronchial cuffing. Heart size appears borderline enlarged. No evidence of pulmonary edema. Mediastinal contours are unremarkable. Atherosclerosis in the thoracic aorta. No definite pneumothorax. Visualized portions of  the bony thorax appears grossly intact.  IMPRESSION: 1. In the setting of recent trauma, the appearance of the left lower lobe is concerning for potential contusion, however, this would be unusual for a typical fall from a standing or seated position. If the patient fell from  a significant height, this warrants consideration. Alternatively, this may reflect sequela of aspiration associated with the trauma, or could simply indicate an underlying infection that pre-existed the prior trauma. Clinical correlation is recommended. There is a small left pleural effusion. 2. No pneumothorax or definite rib fractures identified. 3. Atherosclerosis.   Electronically Signed   By: Trudie Reed M.D.   On: 06/08/2013 20:36   Dg Chest Port 1 View  05/20/2013   CLINICAL DATA:  Airspace disease  EXAM: PORTABLE CHEST - 1 VIEW  COMPARISON:  05/18/2013  FINDINGS: Lungs are clear. No pleural effusion or pneumothorax.  Mild cardiomegaly.  Degenerative changes of the bilateral shoulders.  IMPRESSION: No evidence of acute cardiopulmonary disease.   Electronically Signed   By: Charline Bills M.D.   On: 05/20/2013 07:19    2D Echo:  ------------------------------------------------------------ Transthoracic Echocardiography  Patient: Garnell, Phenix Eugene #: [id number] Study Date: 06/09/2013 Gender: M Age: 28 Height: 175.3cm Weight: 72kg BSA: 1.47m^2 Pt. Status: Room: [facility]  PERFORMING [facility], Echo ADMITTING Jonah Blue ATTENDING Garth Schlatter SONOGRAPHER Jimmy Reel cc:  ------------------------------------------------------------ LV EF: 60% - 65%  ------------------------------------------------------------ Indications: Aortic stenosis 424.1. Syncope 780.2.  ------------------------------------------------------------ Study Conclusions  - Left ventricle: The cavity size was normal. There was moderate concentric hypertrophy. Systolic function  was normal. The estimated ejection fraction was in the range of 60% to 65%. Wall motion was normal; there were no regional wall motion abnormalities. There was an increased relative contribution of atrial contraction to ventricular filling. Features are consistent with a pseudonormal left ventricular filling pattern, with concomitant abnormal relaxation and increased filling pressure (grade 2 diastolic dysfunction). - Aortic valve: Severe diffuse thickening and calcification. There was moderate to severe stenosis. Valve area: 1.44cm^2(VTI). Valve area: 1cm^2 (Vmax). - Mitral valve: Severely calcified annulus. Mild regurgitation. - Left atrium: The atrium was mildly dilated. - Atrial septum: No defect or patent foramen ovale was identified. - Pericardium, extracardiac: A trivial, free-flowing pericardial effusion was identified anterior to the heart and along the right ventricular free wall. Transthoracic echocardiography. M-mode, complete 2D, spectral Doppler, and color Doppler. Height: Height: 175.3cm. Height: 69in. Weight: Weight: 72kg. Weight: 158.4lb. Body mass index: BMI: 23.4kg/m^2. Body surface area: BSA: 1.71m^2. Blood pressure: 147/47. Patient status: Inpatient.  ------------------------------------------------------------   Admission HPI:   Eugene Bauer is a 79 year old man with history of dCHF (grade I), CAD, AS (mild to moderate), COPD, DM2, HTN who presents with syncope and fall two days ago. Patient was accompanied by two of his daughters in ED Elita [phone], Dawn [phone]) but provided the majority of history himself.   Patient states that he was in his USOH on evening of Thursday 10/9 at his assisted living facility ; he took his evening medications and went to bed. However, on Friday morning (10/10), he awoke in his bathroom floor with his walker nearby. He does not remember getting up to go to the bathroom or what happened in the interim period. He had difficulty  getting to his emergency "pull string" to alert someone and was crawling around the bathroom for some time trying to do so. After he had missed lunch and was late for dinner, staff came to check on him and found him down in bathroom and called EMS. No confusion, dizziness, n/v/d.   Patient has baseline shortness of breath, no increase from baseline. He also has chronic cough, no increase from baseline though daughters think it sounds "deeper" than usual; no sputum  production or hemoptysis. Patient reports some difficulty with choking when eating. He does not smoke, does not use O2 at home (recently discontinued at [place] in 02/2013 per daughter). Denies chest pain.   Patient was recently admitted on 05/18/13 for COPD exacerbation, treated with levoquin, prednisone, and albuterol/ipatropium nebulizers. He was discharged back to his assisted living facility. He is a [place] patient but has not seen his PCP there since last hospitalization.    Hospital Course by problem list:  1. Syncope/fall in setting of aortic stenosis - Unclear etiology. Strange story where patient could not remember fall event, and was unable to call for help for >12 hours in ALF. Unsure about loss of consciousness but was alert and aware for most of the down time. Fall/syncope may be secondary to progression of aortic stenosis vs post-micturition syncope, as fall occurred in the bathroom. Repeat 2D echo showed severe aortic thickening and calcification with moderate to severe stenosis, which indicated progression in over past 2 years from 1.44cm2 from 2.02 cm2. Cardiology saw him. Family is not interested in aggressive intervention. Carotid dopplers were recommended based on calcifications seen on imaging, found to be without significant stenosis. Telemetry was without arrhythmia. Patient was discharged to SNF based on PT recommendations.  Of note, we stopped Xarelto this admission. He had completed almost 5 months of therapy since his initial  DVT, which appears to have been a provoked event. With syncope, use of this with Brilinta and ASA is too risky. He also had some some GI bleeding this admission (see below), making the risks even higher of continuing this medication. Daughter is aware and we recommend close follow up at the [place] with both his PCP and cardiologist.  2. HCAP - CXR showed left lower lobe infiltrate. Patient denied increased cough from baseline and sputum production, but family did report a change in the sound quality of his cough. He was afebrile and without vital sign abnormalities throughout his stay. Patient did endorse choking with swallowing on admission interview, so we were initially concerned for aspiration pneumonia given his long lying time on the floor. However SLP evaluation was low-risk for aspiration during oral intake, and he was approved for a regular diet with thin liquids. Follow up modified barium swallowing study was within functional limits. Blood cultures NGTD. He did not produce a sputum culture. Strep urine antigen negative. Legionella negative. He was given 3 days of IV vancomycin and Zosyn per pharmacy for HCAP and aspiration pneumonia coverage; this was converted to oral Levaquin 750mg  x 10 days upon discharge.   3. Bright red blood per rectum - On 06/10/13 patient reported rectal bleeding associated with using the bedside commode. He told us that he had "something cauterized at [facility]" over a decade ago for prior bleeding 2/2 hemorrhoids. Hemoccult positive, and there were streaks of blood on sheets and underwear. Nursing reported streaks and clots during bowel movement. Hemoglobin was baseline on admission (10.3) but down trended 9.4>9.8>9.4>8.1. 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 They will see him as an outpatient with close follow up, for flexible sigmoidoscopy.  4. Mildly elevated troponins - Patient presented with a  mild troponinemia. Denied chest pain, shortness of breath, shoulder pain, abdominal pain, nausea, vomiting throughout his hospitalization. Cardiology was consulted. They believed etiology was likely secondary to demand ischemia. Troponins trended down as below, no further work-up recommended. We continued medical therapy with Brilinta, ASA, and high  dose statin. We did try to obtain records on his prior cardiac history from the [facility], however we were unsuccessful because he was admitted over a holiday weekend [facility]).  Recent Labs  Lab  06/09/13 0015  06/09/13 0600  06/09/13 1235   TROPONINI  1.56*  1.08*  0.99*     5. Diastolic CHF - Per repeat 2D echo, patient has grade 2 diastolic dysfunction without acute exacerbation, moderate concentric hypertrophy with normal systolic function, EF 60-65%. On admission patient appeared dehydrated, so we gave gentle IV hydration. We continued medical management with home metoprolol, losartan, ranolaxine for CAD, and atorvastatin, monitored daily weights, and provided TED hose for stable lower extremity edema.  6. COPD - Stable without exacerbation during hospitalization. We provided albuterol and ipratropium Nebulizers q6h prn wheezing.  7. DM Type2 with peripheral neuropathy - Well-controlled with HgbA1c 6.5% on 06/09/13. We held his home metformin in acute setting and placed him on a sensitive SSI. We continued his home gabapentin for peripheral neuropathy. Lipid panel was checked and normal, as seen below; patient is on high dose statin at home.  Lipid Panel    Component  Value  Date/Time    CHOL  179  06/09/2013 0600    TRIG  93  06/09/2013 0600    HDL  55  06/09/2013 0600    CHOLHDL  3.3  06/09/2013 0600    VLDL  19  06/09/2013 0600    LDLCALC  105*  06/09/2013 0600    8. Hypertension - Blood pressure remained stable and at goal throughout hospitalization on home metoprolol 25mg  bid.  9. Hyponatremia - Chronic, at baseline (126-128).  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   Sodium  Date Value Range Status  06/11/2013 128* 135 - 145 mEq/L Final  06/10/2013 125* 135 - 145 mEq/L Final  06/09/2013 126* 135 - 145 mEq/L Final  06/08/2013 127* 135 - 145 mEq/L Final  05/21/2013 128* 135 - 145 mEq/L Final    Discharge Vitals:   BP 137/58  Pulse 76  Temp(Src) 98.6 F (37 C) (Oral)  Resp 24  Ht 5\' 9"  (1.753 m)  Wt 158 lb 4.6 oz (71.8 kg)  BMI 23.36 kg/m2  SpO2 96%  Discharge Labs:  Results for orders placed during the hospital encounter of 06/08/13 (from the past 24 hour(s))  GLUCOSE, CAPILLARY     Status: Abnormal   Collection Time    06/10/13  4:54 PM      Result Value Range   Glucose-Capillary 160 (*) 70 - 99 mg/dL  GLUCOSE, CAPILLARY     Status: Abnormal   Collection Time    06/10/13  9:35 PM      Result Value Range   Glucose-Capillary 151 (*) 70 - 99 mg/dL  BASIC METABOLIC PANEL     Status: Abnormal   Collection Time    06/11/13  7:19 AM      Result Value Range   Sodium 128 (*) 135 - 145 mEq/L   Potassium 3.6  3.5 - 5.1 mEq/L   Chloride 94 (*) 96 - 112 mEq/L   CO2 28  19 - 32 mEq/L   Glucose, Bld 144 (*) 70 - 99 mg/dL   BUN 9  6 - 23 mg/dL   Creatinine, Ser 4.54  0.50 - 1.35 mg/dL   Calcium 8.8  8.4 - 09.8 mg/dL   GFR calc non Af  Amer 76 (*) >90 mL/min   GFR calc Af Amer 88 (*) >90 mL/min  CBC     Status: Abnormal   Collection Time    06/11/13  7:19 AM      Result Value Range   WBC 5.9  4.0 - 10.5 K/uL   RBC 2.79 (*) 4.22 - 5.81 MIL/uL   Hemoglobin 8.1 (*) 13.0 - 17.0 g/dL   HCT 16.1 (*) 09.6 - 04.5 %   MCV 81.4  78.0 - 100.0 fL   MCH 29.0  26.0 - 34.0 pg   MCHC 35.7  30.0 - 36.0 g/dL   RDW 40.9  81.1 - 91.4 %   Platelets 140 (*) 150 - 400 K/uL  GLUCOSE, CAPILLARY     Status: Abnormal   Collection Time    06/11/13  8:00 AM      Result Value Range   Glucose-Capillary 131 (*) 70 - 99 mg/dL  GLUCOSE, CAPILLARY      Status: Abnormal   Collection Time    06/11/13 11:54 AM      Result Value Range   Glucose-Capillary 185 (*) 70 - 99 mg/dL    Signed: Vivi Barrack, MD 06/11/2013, 2:56 PM   Time Spent on Discharge: 30 minutes Services Ordered on Discharge: None Equipment Ordered on Discharge: None

## 2013-06-11 NOTE — Progress Notes (Signed)
Physical Therapy Treatment Patient Details Name: Eugene Bauer MRN: [id number] DOB: 14-Nov-1926 Today's Date: 06/11/2013 Time: 1308-6578 PT Time Calculation (min): 25 min  PT Assessment / Plan / Recommendation  History of Present Illness 79 yr old male with hx HFpEF, mild-moderate AS, Type 2 DM, HTN, CAD, presented with syncope.  The patient was found in his bathroom where he had been lying for hours due to inability to pull the call cord.  The patient did not remember how get go there.  He admits to it being unusual for him to be in the restroom with his walker.  He admits to some recent difficulty with choking when eating, he used to be oxygen requiring but no longer wears it.  He was recently admitted for a COPD exacerbation at the [facility].    PT Comments   Pt progressing well with mobility with cues needed for transfers and guarding for safety. Encouraged OOB daily with nursing. Will continue to follow. Pt easily distracted and cannot dueltask.  Follow Up Recommendations  SNF;Supervision for mobility/OOB     Does the patient have the potential to tolerate intense rehabilitation     Barriers to Discharge        Equipment Recommendations       Recommendations for Other Services    Frequency     Progress towards PT Goals Progress towards PT goals: Progressing toward goals  Plan Current plan remains appropriate    Precautions / Restrictions Precautions Precautions: Fall Restrictions Weight Bearing Restrictions: No   Pertinent Vitals/Pain Generally sore from fall and arthritis, unrated 97HR 91% RA 141/51   Mobility  Bed Mobility Bed Mobility: Rolling Right;Right Sidelying to Sit;Sitting - Scoot to Edge of Bed Rolling Right: 4: Min assist Right Sidelying to Sit: 4: Min assist;HOB flat;With rails Sitting - Scoot to Edge of Bed: 5: Supervision Details for Bed Mobility Assistance: Verbal cues for technique with assist to pivot hips and elevate trunk Transfers Sit to Stand:  4: Min guard;From bed Stand to Sit: 4: Min guard;To chair/3-in-1;With armrests Details for Transfer Assistance: Verbal cues for hand placement.  Ambulation/Gait Ambulation/Gait Assistance: 4: Min guard Ambulation Distance (Feet): 130 Feet Assistive device: Rolling walker Ambulation/Gait Assistance Details: cueing for posture and position in RW  Gait Pattern: Step-through pattern;Decreased stride length;Trunk flexed Gait velocity: decreased Stairs: No    Exercises General Exercises - Lower Extremity Long Arc Quad: AROM;Both;20 reps;Seated Hip ABduction/ADduction: AROM;Both;20 reps;Seated Hip Flexion/Marching: AROM;Both;20 reps;Seated Toe Raises: AROM;Seated;Both;20 reps Heel Raises: AROM;Seated;Both;20 reps   PT Diagnosis:    PT Problem List:   PT Treatment Interventions:     PT Goals (current goals can now be found in the care plan section)    Visit Information  Last PT Received On: 06/11/13 Assistance Needed: +1 History of Present Illness: 79 yr old male with hx HFpEF, mild-moderate AS, Type 2 DM, HTN, CAD, presented with syncope.  The patient was found in his bathroom where he had been lying for hours due to inability to pull the call cord.  The patient did not remember how get go there.  He admits to it being unusual for him to be in the restroom with his walker.  He admits to some recent difficulty with choking when eating, he used to be oxygen requiring but no longer wears it.  He was recently admitted for a COPD exacerbation at the [facility].     Subjective Data      Cognition  Cognition Arousal/Alertness: Awake/alert Behavior During Therapy:  WFL for tasks assessed/performed Overall Cognitive Status: Within Functional Limits for tasks assessed    Balance     End of Session PT - End of Session Equipment Utilized During Treatment: Gait belt Activity Tolerance: Patient tolerated treatment well Patient left: in chair;with call bell/phone within reach;with nursing/sitter  in room Nurse Communication: Mobility status   GP     Delorse Lek 06/11/2013, 10:10 AM Delaney Meigs, PT [phone]

## 2013-06-11 NOTE — Progress Notes (Addendum)
[facility] here to pick up patient and will take to [facility]. D/C instructions given to patient. Patient with no complaints at the current time. PIV d/c'd catheter tip intact. Daughter notified of patients transfer Elita Quick). Patients belongs including dentures sent with him.

## 2013-06-11 NOTE — Clinical Social Work Note (Signed)
CSW has completed discharge packet and contacted [facility] for transport. CSW contacted pt's family; pt's daughter Elita Quick will complete admission paperwork at facility.    Maryclare Labrador, MSW, [facility] Clinical Social Worker [phone]

## 2013-06-11 NOTE — Progress Notes (Signed)
Student Pharmacist rounding with [facility], asked to consult for anticoagulation/antiplatelet triple therapy in this 79 year old male who was admitted to the hospital on June 09, 2013 with a possible GI bleed.  He has a past medical history significant for CAD, MI, hypertension, CHF, stroke, anemia, and DVT.    Have evaluated his baseline laboratory values as documented in her history and physical exam.  His Hbg: 8.1, Platelets: 140, SCr: 0.92, BP: 137/58, Pulse: 76.  He is currently receiving ticagrelor 90 mg twice daily, rivaroxaban 20 mg daily with supper, and aspirin 81 mg daily.  Rivaroxaban in combination with aspirin alone or aspirin plus ticagrelor (Brilinta), has been associated with a dose-dependent increase in the risk of bleeding (Hazard Ratio = 2.2 to 5.1 for doses from 5 mg to 20 mg/daily) compared to placebo.  Concomitant use of rivaroxaban (15 mg/day) with clopidogrel (75 mg/day), which is less potent than ticagrelor, was associated with approximately doubling of bleeding risk in 30% to 45% of patients in 2 separate studies.  It may be concluded that the substitution of a more potent antiplatelet agent, like ticagrelor, would further increase the risk of bleeding and should be avoided.  One report showed approximately a 5% rate of major bleeding on triple therapy with approximately 50%, 79-month mortality in the patients who bled.  In a contemporary [place] registry-based experience, it was noted that the majority of warfarin-anticoagulated patients undergoing hospitalization for ACS and requiring PCI with stent placement (n = 1,247) were discharged on triple therapy (60%).  However, it is important to note that this increases the risk of major bleeding.    Risk factors for bleeding include elderly, a history of bleeding disorders, PCI procedures, and concomitant use of medications that increase the risk of bleeding, such as anticoagulants and high doses of aspirin.  If  possible, bleeding should be managed without discontinuing ticagrelor.  Stopping ticagrelor increases the risk of subsequent CV events.  The current management of acute GI bleeding in patients receiving NOAC therapy is similar to those individuals who are not anticoagulated or who are anticoagulated with warfarin.  If the patient needs modest intervention such as fulguration of a bleeding angioectasia, anticoagulation should be stopped; however, it can be safely re-instituted within 24-48 hours. In other instances such as resection of a segment of bowel, or colonoscopic resection of a large adenoma, a longer period of interruption such as 5-7 days may be desirable. If the bleeding site cannot be treated or cannot be identified then possible strategies would be to modify bleeding risk factors such as discontinuing concurrent anti-platelet medications, reducing NOAC dose or eliminating the need for anticoagulation.  It is also reasonable to switch to another oral anticoagulant.  Consideration may be given to switching to apixaban once the patient is stable since it has shown no difference in GI bleeding when compared to warfarin unlike dabigatran or rivaroxaban.  Thank you for the opportunity to see this patient.  Malva Limes, PharmD Candidate Russ Halo, PharmD Candidate

## 2013-06-11 NOTE — Procedures (Signed)
Objective Swallowing Evaluation: Modified Barium Swallowing Study  Patient Details  Name: Eugene Bauer MRN: [id number] Date of Birth: 04/18/1927  Today's Date: 06/11/2013 Time: 1305-1320 SLP Time Calculation (min): 15 min  Past Medical History:  Past Medical History  Diagnosis Date  . Aortic stenosis   . High cholesterol   . COPD (chronic obstructive pulmonary disease)   . Retinal artery occlusion   . Emphysema   . GERD (gastroesophageal reflux disease)   . Coronary artery disease   . Heart murmur   . On home oxygen therapy     "2L all the time" (01/02/2013)  . Angina   . Hypertension   . CHF (congestive heart failure)   . MI (myocardial infarction)     "I've had several; last one was 03/09/2012" (01/02/2013  . Asthma   . Pneumonia     "today; have had it at least twice before" (01/02/2013)  . Shortness of breath     "most of the time recently" (01/02/2013)  . Chronic bronchitis   . Type II diabetes mellitus   . History of stomach ulcers     "have them now; they flare up when I eat the wrong thing or don't eat" (01/02/2013)  . Hepatitis     "don't know which one" (01/02/2013)  . WUJWJXBJ(478.2)     "probably weekly" (01/02/2013)  . Stroke     "several; left ankle/foot still weak from one of them" (01/02/2013)  . Arthritis     "all over" (01/02/2013)  . DJD (degenerative joint disease)     "all over" (01/02/2013)  . Chronic lower back pain   . Urinary urgency   . Prostate cancer     "had radiation treatments" (01/02/2013)  . Basal cell carcinoma     "left nose, behind right knee" (01/02/2013)  . Anemia     chronic/notes 01/02/2013   Past Surgical History:  Past Surgical History  Procedure Laterality Date  . Shoulder arthroscopy w/ rotator cuff repair Right 1990's; 2000's    "they've operated on it twice" (01/02/2013)  . Sp pta peripheral      Bilateral leg stents  . Inguinal hernia repair Bilateral     "1 wk apart" (01/02/2013)  . Coronary stent placement      "I've had 7 or 8  stents put in my heart" (01/02/2013)  . Cardiac catheterization      "couldn't get new type of stents put in" (01/02/2013)  . Coronary angioplasty with stent placement      "I've had 7 or 8 stents put in my heart; last stents put in ~ 2 wk ago" (01/02/2013)  . Back surgery    . Anterior fusion cervical spine      "C5-7; took bone off my right hip" (5-01/2013)   HPI:  79 yr old male with hx HFpEF, mild-moderate AS, Type 2 DM, HTN, CAD, presented with syncope.  The patient was found in his bathroom where he had been lying for hours due to inability to pull the call cord.  The patient did not remember how get go there.  He admits to it being unusual for him to be in the restroom with his walker.  He admits to some recent difficulty with choking when eating, he used to be oxygen requiring but no longer wears it.  He was recently admitted for a COPD exacerbation at the [facility]. BSE indicated to assess risk for aspiration and recommend safest, PO diet.  Assessment / Plan / Recommendation Clinical Impression  Dysphagia Diagnosis: Within Functional Limits Clinical impression: Patient presents with a functional oropharyngeal swallow without observed penetration or aspiration. Swallow timely with full clearance of bolus both orally and pharyngeally. No SLP f/u indicated at this time. Please reconsult as needed.     Treatment Recommendation  No treatment recommended at this time    Diet Recommendation Regular;Thin liquid   Liquid Administration via: Cup;Straw Medication Administration: Whole meds with liquid Supervision: Patient able to self feed Compensations: Slow rate;Small sips/bites Postural Changes and/or Swallow Maneuvers: Seated upright 90 degrees    Other  Recommendations Oral Care Recommendations: Oral care BID   Follow Up Recommendations  None           SLP Swallow Goals Patient will utilize recommended strategies during swallow to increase swallowing safety with:  Supervision/safety Swallow Study Goal #2 - Progress: Met   General HPI: 79 yr old male with hx HFpEF, mild-moderate AS, Type 2 DM, HTN, CAD, presented with syncope.  The patient was found in his bathroom where he had been lying for hours due to inability to pull the call cord.  The patient did not remember how get go there.  He admits to it being unusual for him to be in the restroom with his walker.  He admits to some recent difficulty with choking when eating, he used to be oxygen requiring but no longer wears it.  He was recently admitted for a COPD exacerbation at the [facility]. BSE indicated to assess risk for aspiration and recommend safest, PO diet.      Type of Study: Modified Barium Swallowing Study Reason for Referral: Objectively evaluate swallowing function Previous Swallow Assessment: Bedside assessment indicated overt s/s of aspiration with need for MBS to objectively evaluate swallowing function Diet Prior to this Study: Regular;Thin liquids Temperature Spikes Noted: No Respiratory Status: Room air History of Recent Intubation: No Behavior/Cognition: Alert;Cooperative Oral Cavity - Dentition: Adequate natural dentition Oral Motor / Sensory Function: Within functional limits Self-Feeding Abilities: Able to feed self Patient Positioning: Upright in chair Baseline Vocal Quality: Clear Volitional Cough: Strong Volitional Swallow: Able to elicit Anatomy: Within functional limits Pharyngeal Secretions: Not observed secondary MBS    Reason for Referral Objectively evaluate swallowing function   Oral Phase Oral Preparation/Oral Phase Oral Phase: WFL   Pharyngeal Phase Pharyngeal Phase Pharyngeal Phase: Within functional limits  Cervical Esophageal Phase    GO   Ferdinand Lango MA, CCC-SLP [phone]  Cervical Esophageal Phase Cervical Esophageal Phase: [facility]         Eugene Bauer Meryl 06/11/2013, 1:35 PM

## 2013-06-11 NOTE — Clinical Social Work Note (Addendum)
Clinical Social Work Department CLINICAL SOCIAL WORK PLACEMENT NOTE 06/11/2013  Patient:  LARAMIE, MEISSNER  Account Number:  [id number] Admit date:  06/08/2013  Clinical Social Worker:  Maryclare Labrador, Theresia Majors  Date/time:  06/11/2013 11:04 AM  Clinical Social Work is seeking post-discharge placement for this patient at the following level of care:   SKILLED NURSING   (*CSW will update this form in Epic as items are completed)   06/11/2013  Patient/family provided with [facility] Department of Clinical Social Work's list of facilities offering this level of care within the geographic area requested by the patient (or if unable, by the patient's family).  06/11/2013  Patient/family informed of their freedom to choose among providers that offer the needed level of care, that participate in Medicare, Medicaid or managed care program needed by the patient, have an available bed and are willing to accept the patient.  06/11/2013  Patient/family informed of [facility]' ownership interest in [facility], as well as of the fact that they are under no obligation to receive care at this facility.  PASARR submitted to EDS on  EXISTING PASARR number received from EDS on   EXISTING  FL2 transmitted to all facilities in geographic area requested by pt/family on  06/11/2013 FL2 transmitted to all facilities within larger geographic area on   Patient informed that his/her managed care company has contracts with or will negotiate with  certain facilities, including the following:     Patient/family informed of bed offers received:  06/11/2013 Patient chooses bed at [facility] Physician recommends and patient chooses bed at    Patient to be transferred to [facility] on  06/11/2013 Patient to be transferred to facility by [facility]  The following physician request were entered in Epic:   Additional Comments:   Maryclare Labrador, MSW, [facility] Clinical Social  Worker [phone]

## 2013-06-11 NOTE — Clinical Social Work Psychosocial (Signed)
Clinical Social Work Department BRIEF PSYCHOSOCIAL ASSESSMENT 06/11/2013  Patient:  Eugene Bauer, Eugene Bauer     Account Number:  [id number]     Admit date:  06/08/2013  Clinical Social Worker:  Varney Biles  Date/Time:  06/11/2013 10:54 AM  Referred by:  Physician  Date Referred:  06/11/2013 Referred for  SNF Placement   Other Referral:   Interview type:  Patient Other interview type:   CSW also spoke with pt's daughter Burna Mortimer, the oldest of his 4 children. Burna Mortimer was at pt's bedside during CSW visit.    PSYCHOSOCIAL DATA Living Status:  FACILITY Admitted from facility:  OTHER Level of care:  Independent Living Primary support name:  Daylene Katayama Primary support relationship to patient:  CHILD, ADULT Degree of support available:   Good--pt was living at [facility] in the independent living level before hospital admission. Pt has 4 children. Daughters Pam and Burna Mortimer live in [place] and are very involved in pt care.    CURRENT CONCERNS Current Concerns  Post-Acute Placement   Other Concerns:    SOCIAL WORK ASSESSMENT / PLAN Pt's daughter Burna Mortimer explained that her sister Elita Quick is a Engineer, civil (consulting) and provides care for their father. Burna Mortimer is also actively involved in pt's care. Pt was living at the independent living level of [place], where he was found on the floor of his bathroom after a fall. Burna Mortimer explained that her father was on the floor for almost 24 hours before he was found by a staff member. CSW provided support and explained SNF search process. Burna Mortimer explained that the first choice facility is [facility] and that pt has been to this facility before. Burna Mortimer also requested CSW call her sister Derrill Memo also emphasized that [facility] is the first choice, and asked CSW to send clinicals to [facility] in [place]. CSW sent pt's clinicals to both facilities, and spoke with Tresa Endo (admissions coordinator at [facility]). Per MD, pt might discharge this  afternoon. Tresa Endo is in the process of moving beds around to accomodate pt arrival at [facility] this afternoon. CSW will update Tresa Endo concerning pt's discharge time and will facilitate discharge when pt is medically ready.   Assessment/plan status:  Psychosocial Support/Ongoing Assessment of Needs Other assessment/ plan:   Information/referral to community resources:   SNF ([facility]).    PATIENT'S/FAMILY'S RESPONSE TO PLAN OF CARE: Pt and pt's family receptive to CSW visit and understanding of CSW role.       Maryclare Labrador, MSW, [facility] Clinical Social Worker [phone]

## 2013-06-11 NOTE — Progress Notes (Signed)
MBS scheduled for 1300 today pending orders received. MD, please order MBS if agree. Thank you. Ferdinand Lango MA, CCC-SLP [phone]

## 2013-06-11 NOTE — Clinical Documentation Improvement (Signed)
THIS DOCUMENT IS NOT A PERMANENT PART OF THE MEDICAL RECORD  Please update your documentation with the medical record to reflect your response to this query. If you need help knowing how to do this please call [phone].  06/11/13   Dear Dr. Claudell Kyle,   Chart lists "diastolic CHF" multiple times. Please assign an acuity to "diastolic CHF" to support severity of illness and risk of mortality. Thank you.  You may use possible, probable, or suspect with inpatient documentation. possible, probable, suspected diagnoses MUST be documented at the time of discharge  Reviewed: additional documentation in the medical record Changed to grade 2 diastolic heart failure.  Thank You,  Beverley Fiedler RN BSN Clinical Documentation Specialist: [phone] Health Information Management [place]

## 2013-06-11 NOTE — Progress Notes (Signed)
OT Cancellation Note  Patient Details Name: Eugene Bauer MRN: [id number] DOB: [birth year]   Cancelled Treatment:    Reason Eval/Treat Not Completed: Patient at procedure or test/ unavailable.  Also note that pt may discharge to SNF this pm.  Will reattempt tomorrow if pt does not discharge  Boykin Reaper [id number] 06/11/2013, 12:59 PM

## 2013-06-11 NOTE — Progress Notes (Addendum)
Subjective: Patient seen at the bedside. His daughter is also present. He feels well. He notes improvement in his cough, shortness of breath. He denies fevers or chills. He denies chest pain, abdominal pain, nausea, vomiting. He did still note some streaks of blood in his stool this morning. He says this happened to him before ~10 years ago after he had radiation to his prostate. He had a "cauterization" done at the [place] in [place], and has not had a problem since. I asked if we would be able to follow up with one of our GI doctors as an outpatient, and he said this would be no problem. I let him know that the cardiologists would be seeing him today as well to talk to him about his aortic valve. We spent a little bit of time talking about the anticoagulation regimen he is on, and how it may be contributing to his rectal bleeding. We talked in general about the risks and benefits of staying on Xarelto. The patient and his daughter were amenable to me writing a note to his PCP about our concerns. They are going to get her name for me.   Objective: Vital signs in last 24 hours: Filed Vitals:   06/10/13 2305 06/10/13 2359 06/11/13 0406 06/11/13 0432  BP: 117/45  211/194 116/46  Pulse:      Temp: 99.2 F (37.3 C) 99.2 F (37.3 C) 99.8 F (37.7 C)   TempSrc: Oral Oral Oral   Resp: 20  21 20   Height:      Weight:   158 lb 4.6 oz (71.8 kg)   SpO2: 97%  96%    Weight change: -1 lb 12.2 oz (-0.8 kg)  Intake/Output Summary (Last 24 hours) at 06/11/13 0708 Last data filed at 06/11/13 0600  Gross per 24 hour  Intake 1737.5 ml  Output    800 ml  Net  937.5 ml   Physical Exam:  General: Awake and alert in bedside recliner, talking to his daughter HEENT: NCAT, vision grossly intact, oropharynx clear and non-erythematous, moist mucus membranes Neck: supple, no lymphadenopathy appreciated Lungs: CTA bilaterally, no crackles appreciated today Heart: regular rate and rhythm, + SEM murmur best  heard at LUSB, with  radiation to carotids, no rubs or gallops appreciated Abdomen: soft, non-tender, non-distended, normal bowel sounds  Extremities: trace pre-tibial edema bilaterally, distal pulses intact bilaterally Neurologic: grossly non-focal, alert & oriented, strength grossly intact, sensation intact to light touch Psych: appropriate and cooperative  Lab Results: Basic Metabolic Panel:  Recent Labs Lab 06/09/13 0555 06/10/13 0330  NA 126* 125*  K 3.6 3.5  CL 92* 90*  CO2 26 24  GLUCOSE 131* 135*  BUN 11 9  CREATININE 0.73 0.81  CALCIUM 9.1 8.6   Liver Function Tests:  Recent Labs Lab 06/08/13 2016 06/09/13 0555  AST 52* 36  ALT 22 20  ALKPHOS 54 63  BILITOT 1.0 0.9  PROT 5.9* 5.6*  ALBUMIN 2.7* 2.6*   CBC:  Recent Labs Lab 06/08/13 2016  06/09/13 1235 06/10/13 0330  WBC 14.3*  < > 11.7* 10.0  NEUTROABS 12.5*  --   --   --   HGB 10.3*  < > 9.8* 9.4*  HCT 28.5*  < > 27.6* 25.8*  MCV 80.5  < > 80.5 80.4  PLT 140*  < > 137* 144*  < > = values in this interval not displayed. Cardiac Enzymes:  Recent Labs Lab 06/08/13 2016 06/09/13 0015 06/09/13 0600 06/09/13 1235  CKTOTAL 1281*  --   --   --   CKMB 10.7*  --   --   --   TROPONINI  --  1.56* 1.08* 0.99*   BNP:  Recent Labs Lab 06/08/13 2226  PROBNP 4358.0*   CBG:  Recent Labs Lab 06/09/13 1707 06/09/13 2154 06/10/13 0809 06/10/13 1136 06/10/13 1654 06/10/13 2135  GLUCAP 146* 162* 135* 169* 160* 151*   Fasting Lipid Panel:  Recent Labs Lab 06/09/13 0600  CHOL 179  HDL 55  LDLCALC 105*  TRIG 93  CHOLHDL 3.3   Coagulation:  Recent Labs Lab 06/08/13 2225  LABPROT 31.1*  INR 3.14*   Urinalysis:  Recent Labs Lab 06/08/13 2232  COLORURINE YELLOW  LABSPEC 1.017  PHURINE 7.5  GLUCOSEU NEGATIVE  HGBUR NEGATIVE  BILIRUBINUR NEGATIVE  KETONESUR NEGATIVE  PROTEINUR 30*  UROBILINOGEN 1.0  NITRITE NEGATIVE  LEUKOCYTESUR NEGATIVE    Micro Results: Recent Results  (from the past 240 hour(s))  CULTURE, BLOOD (ROUTINE X 2)     Status: None   Collection Time    06/08/13 10:45 PM      Result Value Range Status   Specimen Description BLOOD ARM RIGHT   Final   Special Requests BOTTLES DRAWN AEROBIC ONLY 10CC   Final   Culture  Setup Time     Final   Value: 06/09/2013 03:05     Performed at [facility]   Culture     Final   Value:        BLOOD CULTURE RECEIVED NO GROWTH TO DATE CULTURE WILL BE HELD FOR 5 DAYS BEFORE ISSUING A FINAL NEGATIVE REPORT     Performed at [facility]   Report Status PENDING   Incomplete  CULTURE, BLOOD (ROUTINE X 2)     Status: None   Collection Time    06/08/13 10:55 PM      Result Value Range Status   Specimen Description BLOOD ARM RIGHT   Final   Special Requests BOTTLES DRAWN AEROBIC ONLY 10CC   Final   Culture  Setup Time     Final   Value: 06/09/2013 03:06     Performed at [facility]   Culture     Final   Value:        BLOOD CULTURE RECEIVED NO GROWTH TO DATE CULTURE WILL BE HELD FOR 5 DAYS BEFORE ISSUING A FINAL NEGATIVE REPORT     Performed at [facility]   Report Status PENDING   Incomplete  MRSA PCR SCREENING     Status: None   Collection Time    06/09/13  1:02 AM      Result Value Range Status   MRSA by PCR NEGATIVE  NEGATIVE Final   Comment:            The GeneXpert MRSA Assay (FDA     approved for NASAL specimens     only), is one component of a     comprehensive MRSA colonization     surveillance program. It is not     intended to diagnose MRSA     infection nor to guide or     monitor treatment for     MRSA infections.   Studies/Results: No results found. Medications: I have reviewed the patient's current medications. Scheduled Meds: . aspirin  81 mg Oral Daily  . atorvastatin  80 mg Oral QHS  . fluticasone  2 puff Inhalation BID  . gabapentin  300 mg Oral  QHS  . insulin aspart  0-9 Units Subcutaneous TID WC  . latanoprost  1 drop Both Eyes QHS  .  metoprolol  25 mg Oral BID  . pantoprazole  40 mg Oral Daily  . piperacillin-tazobactam  3.375 g Intravenous Q8H  . ranolazine  500 mg Oral BID  . Rivaroxaban  20 mg Oral Q supper  . tamsulosin  0.4 mg Oral QPC supper  . Ticagrelor  90 mg Oral BID  . vancomycin  1,000 mg Intravenous Q12H   Continuous Infusions: . sodium chloride 50 mL (06/11/13 0415)   PRN Meds:.albuterol, albuterol, ipratropium, morphine injection, nitroGLYCERIN, polyvinyl alcohol  Assessment/Plan: HD#3 for Mr. Solly 79 yo admitted for syncopal episode and pneumonia in setting of aortic stenosis and recent hospitalization for COPD exacerbation.  #Syncope/fall in setting of Aortic Stenosis - Unclear etiology, but may be secondary to progression of aortic stenosis vs post-micturition syncope as event occurred in the bathroom. Severe aortic thickening and calcification noted on ECHO, with moderate to severe stenosis; this indicates progression in over past 2 years (1.44cm2 from 2.02 cm2). Given age and co-morbidities surgical intervention would be high risk. Carotid US w/o significant stenosis. Telemetry w/o arrhythmia.  - Cardiology to see him again today to weigh in on AS issue, will follow up recs - Continue telemetry  - Continue to follow up with social work re: SNF placement  #HCAP - Afebrile without leukocytosis, Day #3 of Vancomycin and Zosyn to cover for HCAP and possible aspiration pneumonia. Strep negative. Legionella negative. SLP evaluation was low-risk for aspiration during oral intake. They plan to do a MBSS at 1pm today. CXR showed left lower lobe infiltrate. Blood cultures NGTD. Patient had not made a sputum culture. - Continue Heart Healthy diet - Will change antibiotics to Levofloxacin 750mg  for HCAP coverage starting tomorrow; lower concern for aspiration given normal SLP evaluation, location of infiltrate, and further discussion with the patient suggesting he was not actually unconscious for most of his  time on the floor at the ALF - Albuterol and ipratropium nebulizers q6h prn wheezing  Recent Labs Lab 06/08/13 2016 06/09/13 0555 06/09/13 1235 06/10/13 0330  WBC 14.3* 12.0* 11.7* 10.0    #Bright Red Blood Per Rectum: Associated with bowel movements (streaks on stool), hemoccult positive, streaks of blood on sheets and under garments over the weekend, nursing reports clots during bowel movement. Pt reports that he had "cauterization" in his rectum over a decade ago. 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 Hemorrhoidal in the setting of Xarelto use. - Appreciate GI recs, they would like him to follow up as an outpatient for possible flexible sigmoidoscopy vs. Anusol (hydrocortisone) suppository therapy, will schedule appt  #Mildly elevated troponins - Likely secondary to demand ischemia, trended down as below, no further work-up recommended by cardiology. - Cardiology consulted, appreciate recs  - Will continue medical therapy (Brilinta, ASA, statin) - Will try to obtain records from [place] today (especially from 12/2012) although medical records dept may still be unavailable since it's a government holiday ([place] day)   Recent Labs Lab 06/09/13 0015 06/09/13 0600 06/09/13 1235  TROPONINI 1.56* 1.08* 0.99*    #Grade 2 diastolic CHF - Grade 2 without exacerbation, EF 60-65%, mod concentric hypertrophy with normal systolic fxn. - Continue medical management with home meds (metoprolol, losartan, Ranolaxine for CAD, Atorvastatin)  - Daily weights, Is and Os  - TED hose   #COPD - Stable without exacerbation. - Albuterol and ipratropium Nebulizers q6h prn wheezing   #DM Type2 with  peripheral neuropathy - Well-controlled with HgbA1c 6.5%. - CBGs q4h  - metformin on hold in acute setting - SSI sensitive  - Cont gabapentin for peripheral neuropathy  Lipid Panel     Component Value Date/Time   CHOL 179 06/09/2013 0600   TRIG 93 06/09/2013 0600   HDL 55 06/09/2013 0600   CHOLHDL 3.3  06/09/2013 0600   VLDL 19 06/09/2013 0600   LDLCALC 105* 06/09/2013 0600    #Hypertension - Stable and at goal - Cont metoprolol  #Hyponatremia - Chronic, near baseline (126-128). 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 - Continue to monitor  Recent Labs Lab 06/08/13 2016 06/09/13 0555 06/10/13 0330  NA 127* 126* 125*    #DVT PPX - On anticoagulation with Xarelto per above, no heparin or Lovenox indicated.  #Code status - DNR.   Dispo: This is Mr. Esterly 3rd hospitalization on this year and 2nd within the past 6 months for HCAP. He will be discharged to SNF. Anticipated discharge in approximately 1-3 day(s).   The patient does have a current PCP [facility], MD) and does need an [facility] hospital follow-up appointment after discharge.  The patient does not have transportation limitations that hinder transportation to clinic appointments.  .Services Needed at time of discharge: Y = Yes, Blank = No PT: PT  OT:   RN:   Equipment:   Other:     LOS: 3 days   Vivi Barrack, MD 06/11/2013, 7:08 AM

## 2013-06-15 LAB — CULTURE, BLOOD (ROUTINE X 2): Culture: NO GROWTH

## 2015-06-30 ENCOUNTER — Inpatient Hospital Stay (HOSPITAL_COMMUNITY)
Admission: EM | Admit: 2015-06-30 | Discharge: 2015-07-07 | DRG: 871 | Disposition: A | Discharge status: Skilled Nursing Facility | Payer: Medicare Other | Attending: Internal Medicine | Admitting: Internal Medicine

## 2015-06-30 ENCOUNTER — Encounter (HOSPITAL_COMMUNITY): Payer: Self-pay

## 2015-06-30 ENCOUNTER — Inpatient Hospital Stay (HOSPITAL_COMMUNITY): Payer: Medicare Other

## 2015-06-30 ENCOUNTER — Emergency Department (HOSPITAL_COMMUNITY): Payer: Medicare Other

## 2015-06-30 DIAGNOSIS — D500 Iron deficiency anemia secondary to blood loss (chronic): Secondary | ICD-10-CM | POA: Diagnosis present

## 2015-06-30 DIAGNOSIS — R195 Other fecal abnormalities: Secondary | ICD-10-CM | POA: Insufficient documentation

## 2015-06-30 DIAGNOSIS — J810 Acute pulmonary edema: Secondary | ICD-10-CM | POA: Diagnosis not present

## 2015-06-30 DIAGNOSIS — I5030 Unspecified diastolic (congestive) heart failure: Secondary | ICD-10-CM

## 2015-06-30 DIAGNOSIS — K219 Gastro-esophageal reflux disease without esophagitis: Secondary | ICD-10-CM | POA: Diagnosis present

## 2015-06-30 DIAGNOSIS — I110 Hypertensive heart disease with heart failure: Secondary | ICD-10-CM | POA: Diagnosis present

## 2015-06-30 DIAGNOSIS — I739 Peripheral vascular disease, unspecified: Secondary | ICD-10-CM | POA: Diagnosis present

## 2015-06-30 DIAGNOSIS — I2510 Atherosclerotic heart disease of native coronary artery without angina pectoris: Secondary | ICD-10-CM | POA: Diagnosis present

## 2015-06-30 DIAGNOSIS — Z85828 Personal history of other malignant neoplasm of skin: Secondary | ICD-10-CM

## 2015-06-30 DIAGNOSIS — I25119 Atherosclerotic heart disease of native coronary artery with unspecified angina pectoris: Secondary | ICD-10-CM | POA: Diagnosis not present

## 2015-06-30 DIAGNOSIS — Z981 Arthrodesis status: Secondary | ICD-10-CM

## 2015-06-30 DIAGNOSIS — E440 Moderate protein-calorie malnutrition: Secondary | ICD-10-CM | POA: Insufficient documentation

## 2015-06-30 DIAGNOSIS — E1140 Type 2 diabetes mellitus with diabetic neuropathy, unspecified: Secondary | ICD-10-CM | POA: Diagnosis present

## 2015-06-30 DIAGNOSIS — M7989 Other specified soft tissue disorders: Secondary | ICD-10-CM | POA: Diagnosis present

## 2015-06-30 DIAGNOSIS — Z923 Personal history of irradiation: Secondary | ICD-10-CM

## 2015-06-30 DIAGNOSIS — Z8711 Personal history of peptic ulcer disease: Secondary | ICD-10-CM | POA: Insufficient documentation

## 2015-06-30 DIAGNOSIS — Z9981 Dependence on supplemental oxygen: Secondary | ICD-10-CM | POA: Diagnosis not present

## 2015-06-30 DIAGNOSIS — Z794 Long term (current) use of insulin: Secondary | ICD-10-CM

## 2015-06-30 DIAGNOSIS — Z955 Presence of coronary angioplasty implant and graft: Secondary | ICD-10-CM

## 2015-06-30 DIAGNOSIS — D649 Anemia, unspecified: Secondary | ICD-10-CM | POA: Diagnosis not present

## 2015-06-30 DIAGNOSIS — E872 Acidosis: Secondary | ICD-10-CM | POA: Diagnosis present

## 2015-06-30 DIAGNOSIS — I252 Old myocardial infarction: Secondary | ICD-10-CM | POA: Diagnosis not present

## 2015-06-30 DIAGNOSIS — J189 Pneumonia, unspecified organism: Secondary | ICD-10-CM

## 2015-06-30 DIAGNOSIS — J9620 Acute and chronic respiratory failure, unspecified whether with hypoxia or hypercapnia: Secondary | ICD-10-CM | POA: Diagnosis present

## 2015-06-30 DIAGNOSIS — I959 Hypotension, unspecified: Secondary | ICD-10-CM | POA: Diagnosis present

## 2015-06-30 DIAGNOSIS — J449 Chronic obstructive pulmonary disease, unspecified: Secondary | ICD-10-CM | POA: Diagnosis not present

## 2015-06-30 DIAGNOSIS — E1165 Type 2 diabetes mellitus with hyperglycemia: Secondary | ICD-10-CM

## 2015-06-30 DIAGNOSIS — J45909 Unspecified asthma, uncomplicated: Secondary | ICD-10-CM | POA: Diagnosis present

## 2015-06-30 DIAGNOSIS — A419 Sepsis, unspecified organism: Principal | ICD-10-CM | POA: Diagnosis present

## 2015-06-30 DIAGNOSIS — J9611 Chronic respiratory failure with hypoxia: Secondary | ICD-10-CM | POA: Diagnosis not present

## 2015-06-30 DIAGNOSIS — Z7982 Long term (current) use of aspirin: Secondary | ICD-10-CM | POA: Diagnosis not present

## 2015-06-30 DIAGNOSIS — R1310 Dysphagia, unspecified: Secondary | ICD-10-CM | POA: Diagnosis present

## 2015-06-30 DIAGNOSIS — Z86718 Personal history of other venous thrombosis and embolism: Secondary | ICD-10-CM

## 2015-06-30 DIAGNOSIS — Z91041 Radiographic dye allergy status: Secondary | ICD-10-CM

## 2015-06-30 DIAGNOSIS — Z7952 Long term (current) use of systemic steroids: Secondary | ICD-10-CM | POA: Diagnosis not present

## 2015-06-30 DIAGNOSIS — E785 Hyperlipidemia, unspecified: Secondary | ICD-10-CM | POA: Diagnosis present

## 2015-06-30 DIAGNOSIS — J441 Chronic obstructive pulmonary disease with (acute) exacerbation: Secondary | ICD-10-CM | POA: Diagnosis not present

## 2015-06-30 DIAGNOSIS — I350 Nonrheumatic aortic (valve) stenosis: Secondary | ICD-10-CM | POA: Diagnosis present

## 2015-06-30 DIAGNOSIS — E860 Dehydration: Secondary | ICD-10-CM | POA: Diagnosis present

## 2015-06-30 DIAGNOSIS — K2960 Other gastritis without bleeding: Secondary | ICD-10-CM | POA: Diagnosis not present

## 2015-06-30 DIAGNOSIS — Z8546 Personal history of malignant neoplasm of prostate: Secondary | ICD-10-CM

## 2015-06-30 DIAGNOSIS — I82409 Acute embolism and thrombosis of unspecified deep veins of unspecified lower extremity: Secondary | ICD-10-CM | POA: Diagnosis present

## 2015-06-30 DIAGNOSIS — Z8673 Personal history of transient ischemic attack (TIA), and cerebral infarction without residual deficits: Secondary | ICD-10-CM

## 2015-06-30 DIAGNOSIS — M1990 Unspecified osteoarthritis, unspecified site: Secondary | ICD-10-CM | POA: Diagnosis present

## 2015-06-30 DIAGNOSIS — N179 Acute kidney failure, unspecified: Secondary | ICD-10-CM | POA: Diagnosis present

## 2015-06-30 DIAGNOSIS — Z8249 Family history of ischemic heart disease and other diseases of the circulatory system: Secondary | ICD-10-CM

## 2015-06-30 DIAGNOSIS — Z79899 Other long term (current) drug therapy: Secondary | ICD-10-CM

## 2015-06-30 DIAGNOSIS — Z6823 Body mass index (BMI) 23.0-23.9, adult: Secondary | ICD-10-CM

## 2015-06-30 DIAGNOSIS — R251 Tremor, unspecified: Secondary | ICD-10-CM | POA: Diagnosis present

## 2015-06-30 DIAGNOSIS — R0600 Dyspnea, unspecified: Secondary | ICD-10-CM

## 2015-06-30 DIAGNOSIS — I5033 Acute on chronic diastolic (congestive) heart failure: Secondary | ICD-10-CM | POA: Diagnosis present

## 2015-06-30 DIAGNOSIS — Z66 Do not resuscitate: Secondary | ICD-10-CM | POA: Diagnosis present

## 2015-06-30 DIAGNOSIS — K2970 Gastritis, unspecified, without bleeding: Secondary | ICD-10-CM | POA: Diagnosis present

## 2015-06-30 DIAGNOSIS — T380X5A Adverse effect of glucocorticoids and synthetic analogues, initial encounter: Secondary | ICD-10-CM | POA: Diagnosis present

## 2015-06-30 DIAGNOSIS — Z91048 Other nonmedicinal substance allergy status: Secondary | ICD-10-CM

## 2015-06-30 DIAGNOSIS — J9601 Acute respiratory failure with hypoxia: Secondary | ICD-10-CM | POA: Diagnosis not present

## 2015-06-30 DIAGNOSIS — D508 Other iron deficiency anemias: Secondary | ICD-10-CM | POA: Diagnosis not present

## 2015-06-30 DIAGNOSIS — R079 Chest pain, unspecified: Secondary | ICD-10-CM | POA: Diagnosis not present

## 2015-06-30 DIAGNOSIS — E119 Type 2 diabetes mellitus without complications: Secondary | ICD-10-CM

## 2015-06-30 HISTORY — DX: Unspecified retinal vascular occlusion: H34.9

## 2015-06-30 LAB — I-STAT CG4 LACTIC ACID, ED: Lactic Acid, Venous: 3.09 mmol/L (ref 0.5–2.0)

## 2015-06-30 LAB — URINALYSIS, ROUTINE W REFLEX MICROSCOPIC
Bilirubin Urine: NEGATIVE
GLUCOSE, UA: NEGATIVE mg/dL
HGB URINE DIPSTICK: NEGATIVE
Ketones, ur: NEGATIVE mg/dL
LEUKOCYTES UA: NEGATIVE
Nitrite: NEGATIVE
PH: 5 (ref 5.0–8.0)
PROTEIN: NEGATIVE mg/dL
SPECIFIC GRAVITY, URINE: 1.016 (ref 1.005–1.030)
Urobilinogen, UA: 0.2 mg/dL (ref 0.0–1.0)

## 2015-06-30 LAB — CREATININE, SERUM
Creatinine, Ser: 1.12 mg/dL (ref 0.61–1.24)
GFR, EST NON AFRICAN AMERICAN: 58 mL/min — AB (ref >60)

## 2015-06-30 LAB — STREP PNEUMONIAE URINARY ANTIGEN: Strep Pneumo Urinary Antigen: NEGATIVE

## 2015-06-30 LAB — CBC
HCT: 25.9 % — ABNORMAL LOW (ref 39.0–52.0)
Hemoglobin: 8.4 g/dL — ABNORMAL LOW (ref 13.0–17.0)
MCH: 27.9 pg (ref 26.0–34.0)
MCHC: 32.4 g/dL (ref 30.0–36.0)
MCV: 86 fL (ref 78.0–100.0)
Platelets: 224 10*3/uL (ref 150–400)
RBC: 3.01 MIL/uL — ABNORMAL LOW (ref 4.22–5.81)
RDW: 17 % — ABNORMAL HIGH (ref 11.5–15.5)
WBC: 8.8 10*3/uL (ref 4.0–10.5)

## 2015-06-30 LAB — CREATININE, URINE, RANDOM: Creatinine, Urine: 101.86 mg/dL

## 2015-06-30 LAB — PROCALCITONIN: Procalcitonin: <0.1 ng/mL

## 2015-06-30 LAB — ABO/RH: ABO/RH(D): A POS

## 2015-06-30 LAB — IRON AND TIBC
IRON: 15 ug/dL — AB (ref 45–182)
SATURATION RATIOS: 8 % — AB (ref 17.9–39.5)
TIBC: 186 ug/dL — ABNORMAL LOW (ref 250–450)
UIBC: 171 ug/dL

## 2015-06-30 LAB — RETICULOCYTES
RBC.: 2.56 MIL/uL — ABNORMAL LOW (ref 4.22–5.81)
Retic Count, Absolute: 74.2 10*3/uL (ref 19.0–186.0)
Retic Ct Pct: 2.9 % (ref 0.4–3.1)

## 2015-06-30 LAB — CBG MONITORING, ED: GLUCOSE-CAPILLARY: 167 mg/dL — AB (ref 65–99)

## 2015-06-30 LAB — BASIC METABOLIC PANEL
ANION GAP: 15 (ref 5–15)
BUN: 16 mg/dL (ref 6–20)
CALCIUM: 9.4 mg/dL (ref 8.9–10.3)
CO2: 24 mmol/L (ref 22–32)
Chloride: 94 mmol/L — ABNORMAL LOW (ref 101–111)
Creatinine, Ser: 1.25 mg/dL — ABNORMAL HIGH (ref 0.61–1.24)
GFR, EST AFRICAN AMERICAN: 59 mL/min — AB (ref >60)
GFR, EST NON AFRICAN AMERICAN: 51 mL/min — AB (ref >60)
GLUCOSE: 162 mg/dL — AB (ref 65–99)
POTASSIUM: 4.4 mmol/L (ref 3.5–5.1)
Sodium: 133 mmol/L — ABNORMAL LOW (ref 135–145)

## 2015-06-30 LAB — LACTIC ACID, PLASMA: LACTIC ACID, VENOUS: 2 mmol/L (ref 0.5–2.0)

## 2015-06-30 LAB — POC OCCULT BLOOD, ED: Fecal Occult Bld: NEGATIVE

## 2015-06-30 LAB — PROTIME-INR
INR: 1.09 (ref 0.00–1.49)
Prothrombin Time: 14.3 seconds (ref 11.6–15.2)

## 2015-06-30 LAB — FERRITIN: FERRITIN: 142 ng/mL (ref 24–336)

## 2015-06-30 LAB — BRAIN NATRIURETIC PEPTIDE: B Natriuretic Peptide: 793.7 pg/mL — ABNORMAL HIGH (ref 0.0–100.0)

## 2015-06-30 LAB — VITAMIN B12: VITAMIN B 12: 948 pg/mL — AB (ref 180–914)

## 2015-06-30 LAB — APTT: APTT: 30 s (ref 24–37)

## 2015-06-30 MED ORDER — PIPERACILLIN-TAZOBACTAM 3.375 G IVPB 30 MIN
3.3750 g | Freq: Once | INTRAVENOUS | Status: AC
  Administered 2015-06-30: 3.375 g via INTRAVENOUS
  Filled 2015-06-30: qty 50

## 2015-06-30 MED ORDER — VANCOMYCIN HCL IN DEXTROSE 1-5 GM/200ML-% IV SOLN
1000.0000 mg | INTRAVENOUS | Status: DC
  Administered 2015-07-01 – 2015-07-02 (×2): 1000 mg via INTRAVENOUS
  Filled 2015-06-30 (×2): qty 200

## 2015-06-30 MED ORDER — PIPERACILLIN-TAZOBACTAM 3.375 G IVPB 30 MIN: 3.3750 g | Freq: Three times a day (TID) | INTRAVENOUS | Status: DC

## 2015-06-30 MED ORDER — PANTOPRAZOLE SODIUM 40 MG PO TBEC
40.0000 mg | DELAYED_RELEASE_TABLET | Freq: Every day | ORAL | Status: DC
  Administered 2015-07-01: 40 mg via ORAL
  Filled 2015-06-30 (×2): qty 1

## 2015-06-30 MED ORDER — RANOLAZINE ER 500 MG PO TB12
500.0000 mg | ORAL_TABLET | Freq: Two times a day (BID) | ORAL | Status: DC
  Administered 2015-07-01 – 2015-07-07 (×14): 500 mg via ORAL
  Filled 2015-06-30 (×14): qty 1

## 2015-06-30 MED ORDER — SODIUM CHLORIDE 0.9 % IV SOLN
INTRAVENOUS | Status: DC
  Administered 2015-06-30 – 2015-07-01 (×2): via INTRAVENOUS

## 2015-06-30 MED ORDER — OLODATEROL HCL 2.5 MCG/ACT IN AERS: 2.5000 ug | INHALATION_SPRAY | Freq: Every day | RESPIRATORY_TRACT | Status: DC

## 2015-06-30 MED ORDER — DM-GUAIFENESIN ER 30-600 MG PO TB12
1.0000 | ORAL_TABLET | Freq: Two times a day (BID) | ORAL | Status: DC
  Administered 2015-07-01 (×3): 1 via ORAL
  Filled 2015-06-30 (×3): qty 1

## 2015-06-30 MED ORDER — SODIUM CHLORIDE 0.9 % IV BOLUS (SEPSIS)
500.0000 mL | Freq: Once | INTRAVENOUS | Status: AC
  Administered 2015-06-30: 500 mL via INTRAVENOUS

## 2015-06-30 MED ORDER — INSULIN ASPART 100 UNIT/ML ~~LOC~~ SOLN
0.0000 [IU] | Freq: Three times a day (TID) | SUBCUTANEOUS | Status: DC
  Administered 2015-07-01: 2 [IU] via SUBCUTANEOUS
  Administered 2015-07-01 – 2015-07-02 (×3): 3 [IU] via SUBCUTANEOUS
  Administered 2015-07-02 (×2): 5 [IU] via SUBCUTANEOUS
  Administered 2015-07-03: 3 [IU] via SUBCUTANEOUS
  Administered 2015-07-03: 5 [IU] via SUBCUTANEOUS
  Administered 2015-07-03 – 2015-07-04 (×2): 3 [IU] via SUBCUTANEOUS
  Administered 2015-07-04: 7 [IU] via SUBCUTANEOUS
  Administered 2015-07-04 – 2015-07-05 (×2): 1 [IU] via SUBCUTANEOUS
  Administered 2015-07-05: 3 [IU] via SUBCUTANEOUS
  Administered 2015-07-05: 1 [IU] via SUBCUTANEOUS
  Administered 2015-07-06: 2 [IU] via SUBCUTANEOUS
  Administered 2015-07-06: 7 [IU] via SUBCUTANEOUS
  Administered 2015-07-06: 2 [IU] via SUBCUTANEOUS
  Administered 2015-07-07: 1 [IU] via SUBCUTANEOUS
  Administered 2015-07-07: 5 [IU] via SUBCUTANEOUS

## 2015-06-30 MED ORDER — SODIUM CHLORIDE 0.9 % IV BOLUS (SEPSIS)
1000.0000 mL | Freq: Once | INTRAVENOUS | Status: AC
  Administered 2015-06-30: 1000 mL via INTRAVENOUS

## 2015-06-30 MED ORDER — POLYVINYL ALCOHOL 1.4 % OP SOLN
1.0000 [drp] | OPHTHALMIC | Status: DC | PRN
  Administered 2015-07-04: 1 [drp] via OPHTHALMIC
  Filled 2015-06-30: qty 15

## 2015-06-30 MED ORDER — ATORVASTATIN CALCIUM 80 MG PO TABS
80.0000 mg | ORAL_TABLET | Freq: Every day | ORAL | Status: DC
  Administered 2015-07-01 – 2015-07-06 (×7): 80 mg via ORAL
  Filled 2015-06-30 (×7): qty 1

## 2015-06-30 MED ORDER — IPRATROPIUM-ALBUTEROL 0.5-2.5 (3) MG/3ML IN SOLN
3.0000 mL | RESPIRATORY_TRACT | Status: DC
  Administered 2015-06-30 – 2015-07-01 (×3): 3 mL via RESPIRATORY_TRACT
  Filled 2015-06-30 (×3): qty 3

## 2015-06-30 MED ORDER — HEPARIN SODIUM (PORCINE) 5000 UNIT/ML IJ SOLN
5000.0000 [IU] | Freq: Three times a day (TID) | INTRAMUSCULAR | Status: DC
  Administered 2015-07-01 (×3): 5000 [IU] via SUBCUTANEOUS
  Filled 2015-06-30 (×5): qty 1

## 2015-06-30 MED ORDER — NITROGLYCERIN 0.4 MG SL SUBL
0.4000 mg | SUBLINGUAL_TABLET | SUBLINGUAL | Status: DC | PRN
  Administered 2015-07-01 – 2015-07-02 (×7): 0.4 mg via SUBLINGUAL
  Filled 2015-06-30 (×7): qty 1

## 2015-06-30 MED ORDER — METHYLPREDNISOLONE SODIUM SUCC 125 MG IJ SOLR
60.0000 mg | Freq: Two times a day (BID) | INTRAMUSCULAR | Status: DC
  Administered 2015-06-30 – 2015-07-01 (×2): 60 mg via INTRAVENOUS
  Filled 2015-06-30 (×2): qty 2

## 2015-06-30 MED ORDER — ASPIRIN EC 81 MG PO TBEC
81.0000 mg | DELAYED_RELEASE_TABLET | Freq: Every day | ORAL | Status: DC
  Administered 2015-07-01: 81 mg via ORAL
  Filled 2015-06-30 (×3): qty 1

## 2015-06-30 MED ORDER — MOMETASONE FUROATE 220 MCG/INH IN AEPB: 2.0000 | INHALATION_SPRAY | Freq: Every day | RESPIRATORY_TRACT | Status: DC

## 2015-06-30 MED ORDER — LATANOPROST 0.005 % OP SOLN
1.0000 [drp] | Freq: Every day | OPHTHALMIC | Status: DC
Start: 2015-07-01 — End: 2015-07-07
  Administered 2015-07-01 – 2015-07-06 (×7): 1 [drp] via OPHTHALMIC
  Filled 2015-06-30 (×2): qty 2.5

## 2015-06-30 MED ORDER — METOPROLOL TARTRATE 12.5 MG HALF TABLET
12.5000 mg | ORAL_TABLET | Freq: Two times a day (BID) | ORAL | Status: DC
  Administered 2015-07-01 – 2015-07-07 (×14): 12.5 mg via ORAL
  Filled 2015-06-30 (×14): qty 1

## 2015-06-30 MED ORDER — VANCOMYCIN HCL IN DEXTROSE 1-5 GM/200ML-% IV SOLN
1000.0000 mg | Freq: Once | INTRAVENOUS | Status: AC
  Administered 2015-06-30: 1000 mg via INTRAVENOUS
  Filled 2015-06-30: qty 200

## 2015-06-30 MED ORDER — PIPERACILLIN-TAZOBACTAM 3.375 G IVPB
3.3750 g | Freq: Three times a day (TID) | INTRAVENOUS | Status: DC
  Administered 2015-07-01 – 2015-07-03 (×7): 3.375 g via INTRAVENOUS
  Filled 2015-06-30 (×8): qty 50

## 2015-06-30 MED ORDER — ALBUTEROL SULFATE (2.5 MG/3ML) 0.083% IN NEBU
2.5000 mg | INHALATION_SOLUTION | RESPIRATORY_TRACT | Status: DC | PRN
  Administered 2015-07-02: 2.5 mg via RESPIRATORY_TRACT
  Filled 2015-06-30 (×2): qty 3

## 2015-06-30 MED ORDER — GABAPENTIN 300 MG PO CAPS
300.0000 mg | ORAL_CAPSULE | Freq: Every day | ORAL | Status: DC
  Administered 2015-07-01 – 2015-07-06 (×7): 300 mg via ORAL
  Filled 2015-06-30 (×7): qty 1

## 2015-06-30 NOTE — Progress Notes (Signed)
ANTIBIOTIC CONSULT NOTE - INITIAL  Pharmacy Consult for vancomycin Indication: rule out sepsis  Allergies  Allergen Reactions  . Iodine Shortness Of Breath  . Iohexol      Code: SOB, Desc: ivp dye, iodine, Onset Date: [id number]     Patient Measurements:   Adjusted Body Weight:   Vital Signs: Temp: 100 F (37.8 C) (10/31 1841) Temp Source: Rectal (10/31 1841) BP: 118/55 mmHg (10/31 2100) Pulse Rate: 92 (10/31 2100) Intake/Output from previous day:   Intake/Output from this shift: Total I/O In: 550 [I.V.:550] Out: -   Labs:  Recent Labs  06/30/15 1831  WBC 8.8  HGB 8.4*  PLT 224  CREATININE 1.25*   CrCl cannot be calculated (Unknown ideal weight.). No results for input(s): VANCOTROUGH, VANCOPEAK, VANCORANDOM, GENTTROUGH, GENTPEAK, GENTRANDOM, TOBRATROUGH, TOBRAPEAK, TOBRARND, AMIKACINPEAK, AMIKACINTROU, AMIKACIN in the last 72 hours.   Microbiology: No results found for this or any previous visit (from the past 720 hour(s)).  Medical History: Past Medical History  Diagnosis Date  . Aortic stenosis   . High cholesterol   . Retinal artery occlusion   . GERD (gastroesophageal reflux disease)   . Coronary artery disease   . Heart murmur   . On home oxygen therapy     "2L all the time" (01/02/2013)  . Angina   . Hypertension   . CHF (congestive heart failure) ([place])   . MI (myocardial infarction) ([place])     "I've had several; last one was 03/09/2012" (01/02/2013  . Asthma   . Pneumonia     "today; have had it at least twice before" (01/02/2013)  . Shortness of breath     "most of the time recently" (01/02/2013)  . Chronic bronchitis ([place])   . History of stomach ulcers     "have them now; they flare up when I eat the wrong thing or don't eat" (01/02/2013)  . Hepatitis     "don't know which one" (01/02/2013)  . DXAJOINO(676.7)     "probably weekly" (01/02/2013)  . Arthritis     "all over" (01/02/2013)  . DJD (degenerative joint disease)     "all over" (01/02/2013)  .  Chronic lower back pain   . Urinary urgency   . Anemia     chronic/notes 01/02/2013  . Retinal embolus, right eye 2010  . Stroke [facility])     "several; left ankle/foot still weak from one of them" (01/02/2013), 2006  . COPD (chronic obstructive pulmonary disease) ([place])   . Emphysema   . Type II diabetes mellitus ([place])     Controlled by Metformin  . Prostate cancer [facility])     "had radiation treatments" (01/02/2013)  . Basal cell carcinoma     "left nose, behind right knee" (01/02/2013)    Medications:  Anti-infectives    Start     Dose/Rate Route Frequency Ordered Stop   07/01/15 2100  vancomycin (VANCOCIN) IVPB 1000 mg/200 mL premix     1,000 mg 200 mL/hr over 60 Minutes Intravenous Every 24 hours 06/30/15 2154     07/01/15 0230  piperacillin-tazobactam (ZOSYN) IVPB 3.375 g     3.375 g 12.5 mL/hr over 240 Minutes Intravenous Every 8 hours 06/30/15 2147     06/30/15 2200  piperacillin-tazobactam (ZOSYN) IVPB 3.375 g  Status:  Discontinued     3.375 g 100 mL/hr over 30 Minutes Intravenous 3 times per day 06/30/15 2042 06/30/15 2145   06/30/15 1945  vancomycin (VANCOCIN) IVPB 1000 mg/200 mL premix  1,000 mg 200 mL/hr over 60 Minutes Intravenous  Once 06/30/15 1935     06/30/15 1945  piperacillin-tazobactam (ZOSYN) IVPB 3.375 g     3.375 g 100 mL/hr over 30 Minutes Intravenous  Once 06/30/15 1935 06/30/15 2108     Assessment: 52 yom presented to the ED with generalized weakness, cough and chest pain. Tmax 100 and WBC is WNL. Scr is elevated at 1.25 and lactic acid is elevated at 3.09. Received first doses of vancomycin + zosyn.   Vanc 10/31>> Zosyn 10/31>>  Goal of Therapy:  Vancomycin trough level 15-20 mcg/ml  Plan:  - Vancomycin 1gm IV Q24H - F/u renal fxn, C&S, clinical status and trough at [place], Rande Lawman 06/30/2015,9:55 PM

## 2015-06-30 NOTE — ED Notes (Signed)
Family at bedside. 

## 2015-06-30 NOTE — ED Notes (Signed)
Phlebotomy at bedside.

## 2015-06-30 NOTE — ED Notes (Addendum)
Per Daughter, Patient had pneumonia starting on June 15, 2015 with pneumonia. Patient normally takes care of himself at home, but after staying in the hospital was too weak to go home. Patient was sent to Rehab to recover. On pt's last day of rehab, lab work showed anemia. Patient's hemoglobin was 7.4. Pt was sent to [facility] for transfusion and positive hemoccult. When he arrived to [place], pt's hemoglobin was 8.9 and patient was sent back to the nursing home. On Thursday, pt had tourniquet placed on the right arm and daughter found tourniquet today. Right arm shows swelling. Patient is alert and oriented x4 upon arrival. Pt has cough and crackles.

## 2015-06-30 NOTE — ED Notes (Signed)
MD at bedside. 

## 2015-06-30 NOTE — ED Notes (Signed)
[facility] MD at bedside.

## 2015-06-30 NOTE — Progress Notes (Signed)
Attempted to call report to ED.  

## 2015-06-30 NOTE — ED Notes (Signed)
Patient transported to Ultrasound 

## 2015-06-30 NOTE — ED Provider Notes (Signed)
CSN: [id number]     Arrival date & time 06/30/15  1809 History   First MD Initiated Contact with Patient 06/30/15 1821     Chief Complaint  Patient presents with  . Anemia     (Consider location/radiation/quality/duration/timing/severity/associated sxs/prior Treatment) HPI Comments: Patient here with weakness and fatigue. Recent anemia while in rehab after a hospitalization for pneumonia, however his counts improved and colonoscopy was scheduled as an outpatient. He's had persistent weakness and fatigue. He is still coughing and his home health worker felt he still likely had pneumonia.  Patient is a 81 y.o. male presenting with anemia. The history is provided by the patient.  Anemia This is a new problem. The current episode started more than 1 week ago. The problem occurs constantly. The problem has not changed since onset.Pertinent negatives include no abdominal pain and no shortness of breath. Nothing aggravates the symptoms. Nothing relieves the symptoms. He has tried nothing for the symptoms.    Past Medical History  Diagnosis Date  . Aortic stenosis   . High cholesterol   . Retinal artery occlusion   . GERD (gastroesophageal reflux disease)   . Coronary artery disease   . Heart murmur   . On home oxygen therapy     "2L all the time" (01/02/2013)  . Angina   . Hypertension   . CHF (congestive heart failure) ([place])   . MI (myocardial infarction) ([place])     "I've had several; last one was 03/09/2012" (01/02/2013  . Asthma   . Pneumonia     "today; have had it at least twice before" (01/02/2013)  . Shortness of breath     "most of the time recently" (01/02/2013)  . Chronic bronchitis ([place])   . History of stomach ulcers     "have them now; they flare up when I eat the wrong thing or don't eat" (01/02/2013)  . Hepatitis     "don't know which one" (01/02/2013)  . UMPNTIRW(431.5)     "probably weekly" (01/02/2013)  . Arthritis     "all over" (01/02/2013)  . DJD (degenerative joint disease)      "all over" (01/02/2013)  . Chronic lower back pain   . Urinary urgency   . Anemia     chronic/notes 01/02/2013  . Retinal embolus, right eye 2010  . Stroke [facility])     "several; left ankle/foot still weak from one of them" (01/02/2013), 2006  . COPD (chronic obstructive pulmonary disease) ([place])   . Emphysema   . Type II diabetes mellitus ([place])     Controlled by Metformin  . Prostate cancer [facility])     "had radiation treatments" (01/02/2013)  . Basal cell carcinoma     "left nose, behind right knee" (01/02/2013)   Past Surgical History  Procedure Laterality Date  . Shoulder arthroscopy w/ rotator cuff repair Right 1990's; 2000's    "they've operated on it twice" (01/02/2013)  . Sp pta peripheral      Bilateral leg stents  . Inguinal hernia repair Bilateral     "1 wk apart" (01/02/2013)  . Coronary stent placement      "I've had 7 or 8 stents put in my heart" (01/02/2013)  . Cardiac catheterization      "couldn't get new type of stents put in" (01/02/2013)  . Coronary angioplasty with stent placement      "I've had 7 or 8 stents put in my heart; last stents put in ~ 2 wk ago" (01/02/2013)  . Back  surgery    . Anterior fusion cervical spine      "C5-7; took bone off my right hip" (5-01/2013)   Family History  Problem Relation Age of Onset  . Coronary artery disease Brother 22  . Coronary artery disease Sister    Social History  Substance Use Topics  . Smoking status: Never Smoker   . Smokeless tobacco: Former User    Types: Chew     Comment: 01/02/2013 "chewed in high school playing ball"  . Alcohol Use: No    Review of Systems  Constitutional: Positive for fatigue.  Respiratory: Positive for cough. Negative for shortness of breath.   Gastrointestinal: Negative for vomiting and abdominal pain.  Neurological: Positive for weakness.  All other systems reviewed and are negative.     Allergies  Iodine and Iohexol  Home Medications   Prior to Admission medications   Medication Sig  Start Date End Date Taking? Authorizing Provider  acetaminophen (TYLENOL) 500 MG tablet Take 500 mg by mouth every 6 (six) hours as needed for pain.    Historical Provider, MD  albuterol (PROVENTIL HFA;VENTOLIN HFA) 108 (90 BASE) MCG/ACT inhaler Inhale 2 puffs into the lungs every 6 (six) hours as needed for wheezing.    Historical Provider, MD  albuterol (PROVENTIL) (5 MG/ML) 0.5% nebulizer solution Take 0.5 mLs (2.5 mg total) by nebulization every 6 (six) hours as needed for wheezing or shortness of breath. 05/21/13   Ejiroghene Arlyce Dice, MD  aspirin 81 MG tablet Take 81 mg by mouth daily.      Historical Provider, MD  atorvastatin (LIPITOR) 80 MG tablet Take 80 mg by mouth at bedtime.    Historical Provider, MD  ferrous sulfate 325 (65 FE) MG tablet Take 650 mg by mouth every evening.    Historical Provider, MD  gabapentin (NEURONTIN) 300 MG capsule Take 300 mg by mouth at bedtime.      Historical Provider, MD  ipratropium (ATROVENT) 0.02 % nebulizer solution Take 2.5 mLs (0.5 mg total) by nebulization every 6 (six) hours as needed. 05/21/13   Ejiroghene Arlyce Dice, MD  metFORMIN (GLUCOPHAGE) 500 MG tablet Take 500 mg by mouth 2 (two) times daily with a meal.    Historical Provider, MD  metoprolol (LOPRESSOR) 50 MG tablet Take 25 mg by mouth 2 (two) times daily.    Historical Provider, MD  mometasone [facility]) 220 MCG/INH inhaler Inhale 2 puffs into the lungs at bedtime.     Historical Provider, MD  nitroGLYCERIN (NITROSTAT) 0.4 MG SL tablet Place 0.4 mg under the tongue every 5 (five) minutes as needed for chest pain.    Historical Provider, MD  omeprazole (PRILOSEC) 40 MG capsule Take 1 capsule (40 mg total) by mouth 2 (two) times daily before a meal. 01/05/13   Shanker Kristeen Mans, MD  polyvinyl alcohol (LIQUIFILM TEARS) 1.4 % ophthalmic solution Place 1 drop into both eyes as needed. 01/05/13   Shanker Kristeen Mans, MD  predniSONE (DELTASONE) 20 MG tablet Take 2 tablets (40 mg total) by mouth daily.  05/21/13   Ejiroghene Arlyce Dice, MD  ranolazine (RANEXA) 500 MG 12 hr tablet Take 500 mg by mouth 2 (two) times daily.      Historical Provider, MD  tamsulosin (FLOMAX) 0.4 MG CAPS capsule Take 0.4 mg by mouth daily after supper.    Historical Provider, MD  Ticagrelor (BRILINTA) 90 MG TABS tablet Take 90 mg by mouth 2 (two) times daily.    Historical Provider, MD  Travoprost, BAK  Free, (TRAVATAN) 0.004 % SOLN ophthalmic solution Place 1 drop into both eyes at bedtime.      Historical Provider, MD   BP 134/69 mmHg  Pulse 100  Temp(Src) 98.6 F (37 C) (Oral)  Resp 22  SpO2 99% Physical Exam  Constitutional: He is oriented to person, place, and time. He appears well-developed and well-nourished. No distress.  HENT:  Head: Normocephalic and atraumatic.  Mouth/Throat: No oropharyngeal exudate.  Eyes: EOM are normal. Pupils are equal, round, and reactive to light.  Neck: Normal range of motion. Neck supple.  Cardiovascular: Normal rate and regular rhythm.  Exam reveals no friction rub.   No murmur heard. Pulmonary/Chest: Effort normal. No respiratory distress. He has no wheezes. He has rhonchi. He has no rales.  Abdominal: Soft. He exhibits no distension. There is no tenderness. There is no rebound.  Musculoskeletal: Normal range of motion. He exhibits no edema.  Neurological: He is alert and oriented to person, place, and time.  Skin: No rash noted. He is not diaphoretic.  Nursing note and vitals reviewed.   ED Course  Procedures (including critical care time) Labs Review Labs Reviewed  CBC  BASIC METABOLIC PANEL  I-STAT CG4 LACTIC ACID, ED  POC OCCULT BLOOD, ED  TYPE AND SCREEN    Imaging Review Dg Chest 1 View  06/30/2015  CLINICAL DATA:  Cough in crackles.  Recently diagnosed pneumonia. EXAM: CHEST 1 VIEW COMPARISON:  06/08/2013. FINDINGS: The cardiac silhouette remains borderline enlarged. Minimal linear opacity at the left lateral lung base. Otherwise, clear lungs with  normal vascularity. No acute bony abnormality. IMPRESSION: 1. Small amount of probable scarring at the left lateral lung base. 2. Stable borderline cardiomegaly. Electronically Signed   By: Claudie Revering M.D.   On: 06/30/2015 19:08   US Renal  06/30/2015  CLINICAL DATA:  Acute kidney injury. EXAM: RENAL / URINARY TRACT ULTRASOUND COMPLETE COMPARISON:  Ultrasound dated 09/20/2010 FINDINGS: Right Kidney: Length: 12.7 cm. Three simple cysts with the largest being 4.5 cm, unchanged since the prior study. Slightly echogenic renal parenchyma. No solid mass or hydronephrosis visualized. Left Kidney: Length: 12.5 cm. Slightly echogenic renal parenchyma. No mass or hydronephrosis. Bladder: Appears normal for degree of bladder distention. Slight prominence of the prostate gland. IMPRESSION: No hydronephrosis or solid mass lesions. Slight increased echogenicity of the renal parenchyma suggesting renal medical disease. Electronically Signed   By: Lorriane Shire M.D.   On: 06/30/2015 21:51   I have personally reviewed and evaluated these images and lab results as part of my medical decision-making.   EKG Interpretation   Date/Time:  Monday June 30 2015 21:24:00 EDT Ventricular Rate:  98 PR Interval:  179 QRS Duration: 104 QT Interval:  382 QTC Calculation: 488 R Axis:   -8 Text Interpretation:  Age not entered, assumed to be  81 years old for  purpose of ECG interpretation Sinus tachycardia Multiple premature  complexes, vent & supraven Probable anteroseptal infarct, old No  significant change since last tracing Confirmed by [facility]  MD, Twiggs  ([id number]) on 07/01/2015 12:04:58 AM      MDM   Final diagnoses:  AKI (acute kidney injury) ([place])  Sepsis, due to unspecified organism ([place])    67M with recent pneumonia and anemia. Here with cough, weakness, fatigue. 100.0 F rectally. No vomiting, however appears pale, states he's fatigued. Lungs with diffuse rhonchi. Will check for pnuemonia and anemia  today. No pneumonia on chest x-ray and no signs of anemia here, however with rectal  temperature 100 and lactate elevated at 3, will start broad-spectrum antibiotics. Clinically has pneumonia with option requirement, coughing, rhonchi in the lungs.   Evelina Bucy, MD 07/01/15 0005

## 2015-06-30 NOTE — H&P (Addendum)
[facility] History and Physical  Lyn Deemer WNI:[id number] DOB: 1927-05-16 DOA: 06/30/2015  Referring physician: ED physician PCP: Pcp Not In System  Specialists:   Chief Complaint: Generalized weakness, cough, chest pain  HPI: Eugene Bauer is a 81 y.o. male with PMH of hyperlipidemia, diabetes mellitus, COPD, GERD, CAD, s/p of stent placement, diastolic congestive heart failure, gastric ulcer, DJD, hepatitis, chronic back pain, right retinal embolus, stroke, prostate cancer (post status of radiation therapy), left nose basal cell carcinoma, DVT, bilateral leg stent placement for possible PAD, recently treated pneumonia, anemia, who presents with generalized weakness, cough and chest pain.  Per pt's daughter, he was recently hospitalized to [facility] in [place] 10/16, and treated for pneumonia with antibiotics, and discharged to nursing home for rehabilitation. He completed 10-day course of Abx. In nursing home, he was found to have hemoglobin drop from 8.1 on 06/11/13-->7.4 and positive FOBT. He was sent to ED of [place] [facility] where he was found to have hemoglobin 8.9, and EDP did not think he needed transfusion. He was discharged to independent living facility. Patient was advised to get endoscopy, which was not done yet. EDP in [place] hospital made some adjustment for his home medications, including discontinuation of Ticagrelor, losartan and Flomax because of soft blood pressure, and changed his scheduled Lasix to prn. His last dose of Lasix was 5 days ago.   Patient reports that after he went home, he has been feeling very weak, which has been progressively getting worse. He continues to cough with small amount of yellow colored sputum production. He has shortness of breath and chest pain. His chest pain is located in front chest. It is intermittent, mild to moderate, nonradiating. Patient cannot tell whether his chest pain is pleuritic. Patient's daughter states that patient developed  right forearm swelling because of continuous placement of tourniquet since Thursday. Patient has fever, but no chills, no unilateral weakness.  In ED, patient was found to have lactate 3.09,  temperature 100.0, intermittently tachycardia, tachypnea, hemoglobin 8.4, negative FOBT, AKI, negative UA. CXR showed small amount of probable scarring at the left lateral lung base; stable borderline cardiomegaly.  Where does patient live?  Independent living facility   Can patient participate in ADLs?   Little  Review of Systems:   General: has fevers, chills, no changes in body weight, has poor appetite, has fatigue HEENT: no blurry vision, hearing changes or sore throat Pulm: has dyspnea, coughing, wheezing CV: has chest pain, no palpitations Abd: no nausea, vomiting, abdominal pain, diarrhea, constipation GU: no dysuria, haws burning on urination, no increased urinary frequency, hematuria  Ext: has leg edema Neuro: no unilateral weakness, numbness, or tingling, no vision change or hearing loss Skin: no rash MSK: No muscle spasm, no deformity, no limitation of range of movement in spin Heme: No easy bruising.  Travel history: No recent long distant travel.  Allergy:  Allergies  Allergen Reactions  . Iodine Shortness Of Breath  . Iohexol      Code: SOB, Desc: ivp dye, iodine, Onset Date: [id number]     Past Medical History  Diagnosis Date  . Aortic stenosis   . High cholesterol   . Retinal artery occlusion   . GERD (gastroesophageal reflux disease)   . Coronary artery disease   . Heart murmur   . On home oxygen therapy     "2L all the time" (01/02/2013)  . Angina   . Hypertension   . CHF (congestive heart failure) ([place])   . MI (  myocardial infarction) ([place])     "I've had several; last one was 03/09/2012" (01/02/2013  . Asthma   . Pneumonia     "today; have had it at least twice before" (01/02/2013)  . Shortness of breath     "most of the time recently" (01/02/2013)  . Chronic  bronchitis ([place])   . History of stomach ulcers     "have them now; they flare up when I eat the wrong thing or don't eat" (01/02/2013)  . Hepatitis     "don't know which one" (01/02/2013)  . ZJIRCVEL(381.0)     "probably weekly" (01/02/2013)  . Arthritis     "all over" (01/02/2013)  . DJD (degenerative joint disease)     "all over" (01/02/2013)  . Chronic lower back pain   . Urinary urgency   . Anemia     chronic/notes 01/02/2013  . Retinal embolus, right eye 2010  . Stroke [facility])     "several; left ankle/foot still weak from one of them" (01/02/2013), 2006  . COPD (chronic obstructive pulmonary disease) ([place])   . Emphysema   . Type II diabetes mellitus ([place])     Controlled by Metformin  . Prostate cancer [facility])     "had radiation treatments" (01/02/2013)  . Basal cell carcinoma     "left nose, behind right knee" (01/02/2013)    Past Surgical History  Procedure Laterality Date  . Shoulder arthroscopy w/ rotator cuff repair Right 1990's; 2000's    "they've operated on it twice" (01/02/2013)  . Sp pta peripheral      Bilateral leg stents  . Inguinal hernia repair Bilateral     "1 wk apart" (01/02/2013)  . Coronary stent placement      "I've had 7 or 8 stents put in my heart" (01/02/2013)  . Cardiac catheterization      "couldn't get new type of stents put in" (01/02/2013)  . Coronary angioplasty with stent placement      "I've had 7 or 8 stents put in my heart; last stents put in ~ 2 wk ago" (01/02/2013)  . Back surgery    . Anterior fusion cervical spine      "C5-7; took bone off my right hip" (5-01/2013)    Social History:  reports that he has never smoked. He has quit using smokeless tobacco. His smokeless tobacco use included Chew. He reports that he does not drink alcohol or use illicit drugs.  Family History:  Family History  Problem Relation Age of Onset  . Coronary artery disease Brother 47  . Coronary artery disease Sister      Prior to Admission medications   Medication Sig Start  Date End Date Taking? Authorizing Provider  acetaminophen (TYLENOL) 500 MG tablet Take 500 mg by mouth every 6 (six) hours as needed for pain.   Yes Historical Provider, MD  albuterol (PROVENTIL HFA;VENTOLIN HFA) 108 (90 BASE) MCG/ACT inhaler Inhale 2 puffs into the lungs every 6 (six) hours as needed for wheezing.   Yes Historical Provider, MD  albuterol (PROVENTIL) (5 MG/ML) 0.5% nebulizer solution Take 0.5 mLs (2.5 mg total) by nebulization every 6 (six) hours as needed for wheezing or shortness of breath. 05/21/13  Yes Ejiroghene Arlyce Dice, MD  aspirin 81 MG tablet Take 81 mg by mouth daily.     Yes Historical Provider, MD  atorvastatin (LIPITOR) 80 MG tablet Take 80 mg by mouth at bedtime.   Yes Historical Provider, MD  gabapentin (NEURONTIN) 300 MG capsule Take 300  mg by mouth at bedtime.     Yes Historical Provider, MD  ipratropium (ATROVENT) 0.02 % nebulizer solution Take 2.5 mLs (0.5 mg total) by nebulization every 6 (six) hours as needed. 05/21/13  Yes Ejiroghene Arlyce Dice, MD  metFORMIN (GLUCOPHAGE) 500 MG tablet Take 500 mg by mouth 2 (two) times daily with a meal.   Yes Historical Provider, MD  metoprolol tartrate (LOPRESSOR) 25 MG tablet Take 12.5 mg by mouth 2 (two) times daily.   Yes Historical Provider, MD  mometasone [facility]) 220 MCG/INH inhaler Inhale 2 puffs into the lungs at bedtime.    Yes Historical Provider, MD  nitroGLYCERIN (NITROSTAT) 0.4 MG SL tablet Place 0.4 mg under the tongue every 5 (five) minutes as needed for chest pain.   Yes Historical Provider, MD  Olodaterol HCl 2.5 MCG/ACT AERS Inhale 2.5 mcg into the lungs daily.   Yes Historical Provider, MD  omeprazole (PRILOSEC) 40 MG capsule Take 1 capsule (40 mg total) by mouth 2 (two) times daily before a meal. 01/05/13  Yes Shanker Kristeen Mans, MD  polyvinyl alcohol (LIQUIFILM TEARS) 1.4 % ophthalmic solution Place 1 drop into both eyes as needed. Patient taking differently: Place 1 drop into both eyes as needed for dry eyes.   01/05/13  Yes Shanker Kristeen Mans, MD  ranolazine (RANEXA) 500 MG 12 hr tablet Take 500 mg by mouth 2 (two) times daily.     Yes Historical Provider, MD  Travoprost, BAK Free, (TRAVATAN) 0.004 % SOLN ophthalmic solution Place 1 drop into both eyes at bedtime.     Yes Historical Provider, MD    Physical Exam: Filed Vitals:   06/30/15 2100 06/30/15 2200 06/30/15 2215 06/30/15 2230  BP: 118/55 137/55  141/64  Pulse: 92 52 96 47  Temp:      TempSrc:      Resp: 24 21 17 19   SpO2: 99% 98% 100% 100%   General: Not in acute distress HEENT:       Eyes: PERRL, EOMI, no scleral icterus.       ENT: No discharge from the ears and nose, no pharynx injection, no tonsillar enlargement.        Neck: No JVD, no bruit, no mass felt. Heme: No neck lymph node enlargement. Cardiac: S1/S2, irregular rhythm, No murmurs, No gallops or rubs. Pulm: has diffused rhonchi and mild wheezing bilaterally. Abd: Soft, nondistended, nontender, no rebound pain, no organomegaly, BS present. There is a large reducible ventral hernia without tenderness Ext: 2+ pitting leg edema bilaterally. 2+DP/PT pulse bilaterally. There is swelling in the right forearm without erythema or tenderness Musculoskeletal: No joint deformities, No joint redness or warmth, no limitation of ROM in spin. Skin: No rashes.  Neuro: Alert, oriented X3, cranial nerves II-XII grossly intact, muscle strength 5/5 in all extremities, sensation to light touch intact.  Psych: Patient is not psychotic, no suicidal or hemocidal ideation.  Labs on Admission:  Basic Metabolic Panel:  Recent Labs Lab 06/30/15 1831  NA 133*  K 4.4  CL 94*  CO2 24  GLUCOSE 162*  BUN 16  CREATININE 1.25*  CALCIUM 9.4   Liver Function Tests: No results for input(s): AST, ALT, ALKPHOS, BILITOT, PROT, ALBUMIN in the last 168 hours. No results for input(s): LIPASE, AMYLASE in the last 168 hours. No results for input(s): AMMONIA in the last 168 hours. CBC:  Recent  Labs Lab 06/30/15 1831  WBC 8.8  HGB 8.4*  HCT 25.9*  MCV 86.0  PLT 224  Cardiac Enzymes: No results for input(s): CKTOTAL, CKMB, CKMBINDEX, TROPONINI in the last 168 hours.  BNP (last 3 results) No results for input(s): BNP in the last 8760 hours.  ProBNP (last 3 results) No results for input(s): PROBNP in the last 8760 hours.  CBG:  Recent Labs Lab 06/30/15 2241  GLUCAP 167*    Radiological Exams on Admission: Dg Chest 1 View  06/30/2015  CLINICAL DATA:  Cough in crackles.  Recently diagnosed pneumonia. EXAM: CHEST 1 VIEW COMPARISON:  06/08/2013. FINDINGS: The cardiac silhouette remains borderline enlarged. Minimal linear opacity at the left lateral lung base. Otherwise, clear lungs with normal vascularity. No acute bony abnormality. IMPRESSION: 1. Small amount of probable scarring at the left lateral lung base. 2. Stable borderline cardiomegaly. Electronically Signed   By: Claudie Revering M.D.   On: 06/30/2015 19:08   US Renal  06/30/2015  CLINICAL DATA:  Acute kidney injury. EXAM: RENAL / URINARY TRACT ULTRASOUND COMPLETE COMPARISON:  Ultrasound dated 09/20/2010 FINDINGS: Right Kidney: Length: 12.7 cm. Three simple cysts with the largest being 4.5 cm, unchanged since the prior study. Slightly echogenic renal parenchyma. No solid mass or hydronephrosis visualized. Left Kidney: Length: 12.5 cm. Slightly echogenic renal parenchyma. No mass or hydronephrosis. Bladder: Appears normal for degree of bladder distention. Slight prominence of the prostate gland. IMPRESSION: No hydronephrosis or solid mass lesions. Slight increased echogenicity of the renal parenchyma suggesting renal medical disease. Electronically Signed   By: Lorriane Shire M.D.   On: 06/30/2015 21:51    EKG:   Not done in ED, will get one.   Assessment/Plan Principal Problem:   Sepsis ([place]) Active Problems:   CAD (coronary artery disease)   Anemia   Diabetes mellitus (HCC)   Diastolic CHF (HCC)   COPD  exacerbation (HCC)   Acute on chronic respiratory failure (HCC)   AKI (acute kidney injury) ([place])   History of stroke   GERD (gastroesophageal reflux disease)  Sepsis ([place]): Patient is septic on admission with elevated lactate, fever and tachypnea. Etiology is not completely clear. Patient has respiratory symptoms, including shortness of breath, cough and chest pain. He also has diffused rhonchi and mild wheezing on auscultation, indicating infection in lung, possibly pneumonia versus COPD exacerbation though CXR did not show obvious infiltration.  -will admit patient to SDU -Nebulizers: scheduled Duoneb and prn albuterol -Solu-Medrol 60 mg IV bid -Abx: IV vancomycin and Zosyn were started in emergency room, we'll continue -Mucinex for cough  -Urine legionella and S. pneumococcal antigen -Follow up blood culture x2, sputum culture, Flu pcr  -will get Procalcitonin and trend lactic acid levels per sepsis protocol. -IVF: 1.5L of NS bolus in ED, 75 cc/h (patient has congestive heart failure and is fluid overloaded currently, limiting aggressive IV fluids treatment).  COPD exacerbation and acute on chronic respiratory failure; -see above  CAD (coronary artery disease): s/p stent placement. Patient has chest pain, which is most likely due to infection in lung, but cannot completely rule out ACS. -will continue ASA, lipitor, and Ranolazin -check trop x3  DM-II: Last A1c 6.5 on 10/11/4, well controled. Patient is taking metformin at home -SSI -Check R4W  Diastolic CHF ([place]): 2-D echo on 06/09/13 showed EF 60-65% with grade 2 diastolic dysfunction. Patient is currently on Lasix when necessary. He is clinically fluid overloaded on admission with a 2+ leg edema. -Due to sepsis, Lasix is on hold -continue aspirin and metoprolol -Check BMP -Watch volume status closely while receiving IVF  AKI: Likely due to prerenal  secondary to dehydration. - IVF as above - Check  FeUrea - US-renal -  Follow up renal function by BMP - Hold Diuretics  History of stroke: -ASA  GERD: -Protonix  Anemia: likely due to GI blood loss. He had positive FOBT recently, but negative for today. He has hx of gastric ulcer - on protonix - check Anemia panel - need to give GI referral at discharge.  S/p of bilateral leg stent for possible PVD on 2014 and Hx of DVT: Used to be on Ticagrelor, which was discontinued due to GIB. -continue to hold Ticagrelor   DVT ppx: SQ Heparin     Code Status: DNR Family Communication:   Yes, patient's daughter at bed side Disposition Plan: Admit to inpatient   Date of Service 06/30/2015    Ivor Costa [facility] Pager [phone]  If 7PM-7AM, please contact night-coverage [email or web address] Password [email or web address] 06/30/2015, 10:49 PM

## 2015-06-30 NOTE — ED Notes (Signed)
Attempted to call report to floor; RN to call back  

## 2015-06-30 NOTE — ED Notes (Signed)
MD at the bedside  

## 2015-07-01 ENCOUNTER — Ambulatory Visit (HOSPITAL_COMMUNITY): Payer: Medicare Other

## 2015-07-01 DIAGNOSIS — D508 Other iron deficiency anemias: Secondary | ICD-10-CM

## 2015-07-01 DIAGNOSIS — M7989 Other specified soft tissue disorders: Secondary | ICD-10-CM

## 2015-07-01 DIAGNOSIS — E440 Moderate protein-calorie malnutrition: Secondary | ICD-10-CM | POA: Insufficient documentation

## 2015-07-01 LAB — COMPREHENSIVE METABOLIC PANEL
ALBUMIN: 2.1 g/dL — AB (ref 3.5–5.0)
ALK PHOS: 60 U/L (ref 38–126)
ALT: 14 U/L — ABNORMAL LOW (ref 17–63)
AST: 17 U/L (ref 15–41)
Anion gap: 5 (ref 5–15)
BILIRUBIN TOTAL: 0.5 mg/dL (ref 0.3–1.2)
BUN: 16 mg/dL (ref 6–20)
CO2: 29 mmol/L (ref 22–32)
CREATININE: 1.1 mg/dL (ref 0.61–1.24)
Calcium: 8.6 mg/dL — ABNORMAL LOW (ref 8.9–10.3)
Chloride: 100 mmol/L — ABNORMAL LOW (ref 101–111)
GFR calc Af Amer: >60 mL/min (ref >60)
GFR, EST NON AFRICAN AMERICAN: 59 mL/min — AB (ref >60)
GLUCOSE: 256 mg/dL — AB (ref 65–99)
POTASSIUM: 4.6 mmol/L (ref 3.5–5.1)
Sodium: 134 mmol/L — ABNORMAL LOW (ref 135–145)
TOTAL PROTEIN: 5.1 g/dL — AB (ref 6.5–8.1)

## 2015-07-01 LAB — GLUCOSE, CAPILLARY
GLUCOSE-CAPILLARY: 184 mg/dL — AB (ref 65–99)
GLUCOSE-CAPILLARY: 223 mg/dL — AB (ref 65–99)
Glucose-Capillary: 235 mg/dL — ABNORMAL HIGH (ref 65–99)
Glucose-Capillary: 273 mg/dL — ABNORMAL HIGH (ref 65–99)

## 2015-07-01 LAB — TROPONIN I
Troponin I: 0.04 ng/mL — ABNORMAL HIGH (ref <0.031)
Troponin I: 0.04 ng/mL — ABNORMAL HIGH (ref <0.031)
Troponin I: 0.03 ng/mL (ref <0.031)

## 2015-07-01 LAB — LIPID PANEL
Cholesterol: 137 mg/dL (ref 0–200)
HDL: 40 mg/dL — ABNORMAL LOW (ref >40)
LDL Cholesterol: 86 mg/dL (ref 0–99)
TRIGLYCERIDES: 55 mg/dL (ref <150)
Total CHOL/HDL Ratio: 3.4 RATIO
VLDL: 11 mg/dL (ref 0–40)

## 2015-07-01 LAB — FOLATE: Folate: 13.3 ng/mL (ref >5.9)

## 2015-07-01 LAB — CBC WITH DIFFERENTIAL/PLATELET
BASOS ABS: 0 10*3/uL (ref 0.0–0.1)
BASOS PCT: 0 %
Eosinophils Absolute: 0 10*3/uL (ref 0.0–0.7)
Eosinophils Relative: 0 %
HEMATOCRIT: 21.1 % — AB (ref 39.0–52.0)
HEMOGLOBIN: 7.1 g/dL — AB (ref 13.0–17.0)
Lymphocytes Relative: 6 %
Lymphs Abs: 0.4 10*3/uL — ABNORMAL LOW (ref 0.7–4.0)
MCH: 28.6 pg (ref 26.0–34.0)
MCHC: 33.6 g/dL (ref 30.0–36.0)
MCV: 85.1 fL (ref 78.0–100.0)
MONO ABS: 0.2 10*3/uL (ref 0.1–1.0)
Monocytes Relative: 3 %
NEUTROS ABS: 6.1 10*3/uL (ref 1.7–7.7)
NEUTROS PCT: 91 %
Platelets: 183 10*3/uL (ref 150–400)
RBC: 2.48 MIL/uL — AB (ref 4.22–5.81)
RDW: 16.7 % — ABNORMAL HIGH (ref 11.5–15.5)
WBC: 6.7 10*3/uL (ref 4.0–10.5)

## 2015-07-01 LAB — LACTIC ACID, PLASMA
LACTIC ACID, VENOUS: 1.2 mmol/L (ref 0.5–2.0)
LACTIC ACID, VENOUS: 1.3 mmol/L (ref 0.5–2.0)
Lactic Acid, Venous: 1.5 mmol/L (ref 0.5–2.0)

## 2015-07-01 LAB — URINE CULTURE: Special Requests: NORMAL

## 2015-07-01 LAB — INFLUENZA PANEL BY PCR (TYPE A & B)
H1N1 flu by pcr: NOT DETECTED
INFLAPCR: NEGATIVE
Influenza B By PCR: NEGATIVE

## 2015-07-01 LAB — MRSA PCR SCREENING: MRSA by PCR: NEGATIVE

## 2015-07-01 MED ORDER — CETYLPYRIDINIUM CHLORIDE 0.05 % MT LIQD
7.0000 mL | Freq: Two times a day (BID) | OROMUCOSAL | Status: DC
  Administered 2015-07-01 – 2015-07-03 (×4): 7 mL via OROMUCOSAL

## 2015-07-01 MED ORDER — GLUCERNA SHAKE PO LIQD
237.0000 mL | Freq: Two times a day (BID) | ORAL | Status: DC
  Administered 2015-07-01 – 2015-07-07 (×3): 237 mL via ORAL

## 2015-07-01 MED ORDER — IPRATROPIUM-ALBUTEROL 0.5-2.5 (3) MG/3ML IN SOLN
3.0000 mL | Freq: Four times a day (QID) | RESPIRATORY_TRACT | Status: DC
  Administered 2015-07-01 – 2015-07-07 (×23): 3 mL via RESPIRATORY_TRACT
  Filled 2015-07-01 (×24): qty 3

## 2015-07-01 MED ORDER — PREDNISONE 20 MG PO TABS
40.0000 mg | ORAL_TABLET | Freq: Every day | ORAL | Status: DC
  Administered 2015-07-02: 40 mg via ORAL
  Filled 2015-07-01: qty 2

## 2015-07-01 MED ORDER — ADULT MULTIVITAMIN W/MINERALS CH
1.0000 | ORAL_TABLET | Freq: Every day | ORAL | Status: DC
  Administered 2015-07-01 – 2015-07-07 (×7): 1 via ORAL
  Filled 2015-07-01 (×7): qty 1

## 2015-07-01 MED ORDER — PRO-STAT SUGAR FREE PO LIQD
30.0000 mL | Freq: Every day | ORAL | Status: DC
  Administered 2015-07-01 – 2015-07-07 (×6): 30 mL via ORAL
  Filled 2015-07-01 (×5): qty 30

## 2015-07-01 MED ORDER — BUDESONIDE 0.5 MG/2ML IN SUSP
0.5000 mg | Freq: Two times a day (BID) | RESPIRATORY_TRACT | Status: DC
  Administered 2015-07-01 – 2015-07-07 (×12): 0.5 mg via RESPIRATORY_TRACT
  Filled 2015-07-01 (×13): qty 2

## 2015-07-01 NOTE — Progress Notes (Signed)
Initial Nutrition Assessment  DOCUMENTATION CODES:   Non-severe (moderate) malnutrition in context of acute illness/injury  INTERVENTION:  Recommend liberalizing diet to Low Sodium Provide Glucerna Shake po BID, each supplement provides 220 kcal and 10 grams of protein Provide Pro- stat once daily, provides 100 kcal and 15 grams of protein Provide Multivitamin with minerals daily  NUTRITION DIAGNOSIS:   Inadequate oral intake related to acute illness as evidenced by percent weight loss, energy intake < 75% for > 7 days.   GOAL:   Patient will meet greater than or equal to 90% of their needs   MONITOR:   PO intake, Supplement acceptance, Labs, Skin, I & O's  REASON FOR ASSESSMENT:   Malnutrition Screening Tool    ASSESSMENT:   81 y.o. male with PMH of hyperlipidemia, diabetes mellitus, COPD, GERD, CAD, s/p of stent placement, diastolic congestive heart failure, gastric ulcer, DJD, hepatitis, chronic back pain, right retinal embolus, stroke, prostate cancer (post status of radiation therapy), left nose basal cell carcinoma, DVT, bilateral leg stent placement for possible PAD, recently treated pneumonia, anemia, who presents with generalized weakness, cough and chest pain.  Patient states that he has been eating poorly for the past week due to acute illness with nausea, vomiting, and poor appetite. He states that he usually weighs 174 lbs and lost down to 165 lbs this week. He currently weighs 168 lbs but, has edema in lower extremeties. He reports feeling week to the point that he is unable to walk. Pt has mild fat wasting and moderate muscle wasting per physical exam. He reports eating about 50% of lunch today. Patient agreeable to receiving nutritional supplements until appetite improves.   Labs:  Low hemoglobin, low sodium, elevated glucose, low calcium  Diet Order:  Diet Heart Room service appropriate?: Yes; Fluid consistency:: Thin  Skin:  Reviewed, no issues  Last BM:   10/30  Height:   Ht Readings from Last 1 Encounters:  07/01/15 5\' 8"  (1.727 m)    Weight:   Wt Readings from Last 1 Encounters:  07/01/15 168 lb 6.9 oz (76.4 kg)    Ideal Body Weight:  70 kg  BMI:  Body mass index is 25.62 kg/(m^2).  Estimated Nutritional Needs:   Kcal:  1700-1900  Protein:  100-110 grams  Fluid:  1.7-1.9 L/day  EDUCATION NEEDS:   No education needs identified at this time  Frederic, LDN Inpatient Clinical Dietitian Pager: [phone] After Hours Pager: [phone]

## 2015-07-01 NOTE — Care Management Note (Signed)
Case Management Note  Patient Details  Name: Jeshurun Oaxaca MRN: [id number] Date of Birth: 01-13-1927  Subjective/Objective:     Pt is a resident @ [facility] and wants to return there, will need PT/OT services.  [employer] [phone]) provides therapy at [facility] and orders will need to be faxed to [facility] @ [phone].                            Expected Discharge Plan:  [place]  Discharge planning Services  CM Consult  HH Arranged:  PT, OT [facility] Agency:  Other - See comment  Status of Service:  In process, will continue to follow  Girard Cooter, RN 07/01/2015, 2:49 PM

## 2015-07-01 NOTE — Progress Notes (Signed)
Patient's O2 was removed by Dr. Verlon Au per daughter. Patients heart rate went into the 140s and began having chest pain. Patient requested nitro. He was given 2 does SL and put back on 2L [place]. Patients heart rate returned to 105. Oxygen remained 95 or higher during episode. Patient states relief. Paged MD. Will continue to closely monitor.

## 2015-07-01 NOTE — Progress Notes (Signed)
Eugene Bauer HYI:[id number] DOB: 1927-04-12 DOA: 06/30/2015 PCP: Pcp Not In System  Brief narrative:  81 y/o ? Known h/o syncope 2014 from suspected AS Prior DVT 12/2012 now off blood thinner ty ii DM + neuropathy Diastolic HF COPD mod-severe on o2 at home Prior CAD Unsusccessful RCA stent/angio 2011 ? PVD s/p stenting  Daughter states recently Rx [facility] 10/16 Rx pneumonia completed 10 days history-positive FOBT, hemoglobin reportedly 7.4 At [facility] discontinuation type character lower 90 (blood thinner for stents), losartan 25, Flomax for an Lasix DC as well  Admitted 06/30/15 with fever, lactic acidosis, sob, chills and hypotension to SDU  Intermittent coughing and daughter feels did not completely respond to treatment for pneumonia CXR on admit = scarring left lateral lung base, borderline cardiomegaly Renal ultrasound performed = no hydronephrosis or solid lesion  Past medical history-As per Problem list Chart reviewed as below-   Consultants:    Procedures:    Antibiotics:  Vancomycin 10/31  Zosyn 10/31   Subjective   Alert pleasant but daughter insists on giving most of the history No specific chest pain Has been coughing somewhat No coughing with eating No fever no chills   Objective    Interim History:   Telemetry: Sinus tach with PVCs   Objective: Filed Vitals:   07/01/15 0300 07/01/15 0400 07/01/15 0446 07/01/15 0710  BP: 128/55 117/54  102/49  Pulse: 81 78  76  Temp:  98 F (36.7 C)  97.3 F (36.3 C)  TempSrc:  Axillary  Oral  Resp: 17 16  14   Height:      Weight:      SpO2: 100% 100% 100% 100%    Intake/Output Summary (Last 24 hours) at 07/01/15 7867 Last data filed at 07/01/15 6720  Gross per 24 hour  Intake   2775 ml  Output    300 ml  Net   2475 ml    Exam:  General: EOMI NCAT, tongue protrudes and goes towards the right, dentures, no facial twisting, Cardiovascular: S1-S2 grade 2/6 murmur/3/6 murmur all  over precordium, no bruit, cannot appreciate really any JVD Respiratory: Coarse lung sounds, no rales no rhonchi Abdomen: Soft, diastases recti with potential hernia Skin foot edema however only trace lower extremity edema, right arm appears visibly large than left Neuro tracks my finger and moves all 4 limbs appropriately, detailed neuro exam deferred  Data Reviewed: Basic Metabolic Panel:  Recent Labs Lab 06/30/15 1831 06/30/15 2231  NA 133*  --   K 4.4  --   CL 94*  --   CO2 24  --   GLUCOSE 162*  --   BUN 16  --   CREATININE 1.25* 1.12  CALCIUM 9.4  --    Liver Function Tests: No results for input(s): AST, ALT, ALKPHOS, BILITOT, PROT, ALBUMIN in the last 168 hours. No results for input(s): LIPASE, AMYLASE in the last 168 hours. No results for input(s): AMMONIA in the last 168 hours. CBC:  Recent Labs Lab 06/30/15 1831  WBC 8.8  HGB 8.4*  HCT 25.9*  MCV 86.0  PLT 224   Cardiac Enzymes:  Recent Labs Lab 07/01/15 0111  TROPONINI 0.04*   BNP: Invalid input(s): POCBNP CBG:  Recent Labs Lab 06/30/15 2241  GLUCAP 167*    Recent Results (from the past 240 hour(s))  MRSA PCR Screening     Status: None   Collection Time: 07/01/15  1:23 AM  Result Value Ref Range Status   MRSA by PCR  NEGATIVE NEGATIVE Final    Comment:        The GeneXpert MRSA Assay (FDA approved for NASAL specimens only), is one component of a comprehensive MRSA colonization surveillance program. It is not intended to diagnose MRSA infection nor to guide or monitor treatment for MRSA infections.      Studies:              All Imaging reviewed and is as per above notation   Scheduled Meds: . antiseptic oral rinse  7 mL Mouth Rinse BID  . aspirin EC  81 mg Oral Daily  . atorvastatin  80 mg Oral QHS  . budesonide (PULMICORT) nebulizer solution  0.5 mg Nebulization BID  . dextromethorphan-guaiFENesin  1 tablet Oral BID  . gabapentin  300 mg Oral QHS  . heparin  5,000 Units  Subcutaneous 3 times per day  . insulin aspart  0-9 Units Subcutaneous TID WC  . ipratropium-albuterol  3 mL Nebulization Q4H  . latanoprost  1 drop Right Eye QHS  . methylPREDNISolone (SOLU-MEDROL) injection  60 mg Intravenous Q12H  . metoprolol tartrate  12.5 mg Oral BID  . pantoprazole  40 mg Oral Daily  . piperacillin-tazobactam (ZOSYN)  IV  3.375 g Intravenous Q8H  . ranolazine  500 mg Oral BID  . vancomycin  1,000 mg Intravenous Q24H   Continuous Infusions: . sodium chloride 75 mL/hr at 06/30/15 2108     Assessment/Plan:   1. Failed prior therapy for committee acquired pneumonia-continue vancomycin and Zosyn. CBC plus differential in a.m. transition 75 cc an hour saline-->50 cc an hour given history of diastolic heart failure. Get chest x-ray a.m., cycle lactic acid--consider rapid transition to by mouth Levaquin 2. Prior severe AS-would hold on echo for now 3. ty ii DM with consultations of neuropathy-CBG ranging 164-184, cont sensitive scale, hold metformin 500 twice a day. Continue gabapentin 300 daily at bedtime 4. Prior CAD-unamenable to intervention. Continue Ranexa 500 twice a day, metoprolol 12.5 twice a day, , aspirin 81 daily.  EKG from admission showed PVCs plus possible incomplete right bundle. No suggestion of ischemia. Would not cycle going forward troponins at present time--would repeat EKG if chest pain or other issues 5. COPD-Asmanex 2 puffs daily at bedtime, albuterol inhalers 2 puffs every 6,Olodaterol 2.5 daily, Atrovent 2.5 mils every 6 when necessary, albuterol nebs.  On solumedrol 60 daily.  NO wheeze so can change  To prednisone 40 daily 6. ? Decompensated heart failure-has lower extremity edema but no rales or rhonchi and chest x-ray not suggestive of heart failure. He has elevated troponins and BNP and I'm not sure if this is from infection. Low rate of IV fluid as above. Monitor for improvement in a.m. would hold Lasix dosing for now but if resumed would start  at low dose 20-40 mg by mouth only. 7. ? PVD-no records however was apparently on Ticagralore which was discontinued recently given heme positive stool. Hemoglobin is an 8.4 range so no acute indication to have GI follow the patient at present time. Repeat CBC in a.m. He will need some type of antiplatelet agent possibly in addition to just aspirin to prevent further issues. 8. Prior DVT-Off Xarelto-was taken of fof this ` 2 yr ago?.  NO current indication for furthe rAC.  ASA has some evidence for 2/2 prevention-see above discussion 9. Htn-was hypotensive on admission presumably from infection-losartan on hold.  Metorprolol to continue. 10. HLd-Lipitor 80 daily 11. Gerd-cont Protonix 40 daily  Code Status: DNR  Family Communication: discussed with daughter Jeannene Patella at the bedside Disposition Plan: inpatient   Verneita Griffes, MD  [facility] Pager [phone] 07/01/2015, 7:29 AM    LOS: 1 day

## 2015-07-01 NOTE — Evaluation (Signed)
Physical Therapy Evaluation Patient Details Name: Eugene Bauer MRN: [id number] DOB: 1926/09/17 Today's Date: 07/01/2015   History of Present Illness  Eugene Bauer is a 81 y.o. male with PMH of hyperlipidemia, diabetes mellitus, COPD, GERD, CAD, s/p of stent placement, diastolic congestive heart failure, gastric ulcer, DJD, hepatitis, chronic back pain, right retinal embolus, stroke, prostate cancer (post status of radiation therapy), left nose basal cell carcinoma, DVT, bilateral leg stent placement for possible PAD, recently treated pneumonia, anemia, who presents with generalized weakness, cough and chest pain.  Clinical Impression  Pt admitted with above diagnosis. Pt currently with functional limitations due to the deficits listed below (see PT Problem List). Pt able to stand pivot to chair with supervision only.  Good safety with transfer.  Pt states he hopes to get well enough to go back to I living and receive HHPT and HHOT. Possibly would benefit from [facility] initially as well. He states that he can use wheelchair at times if her needs to.  Has equipment.  Pt will benefit from skilled PT to increase their independence and safety with mobility to allow discharge to the venue listed below.      Follow Up Recommendations Home health PT;Supervision - Intermittent; HHOT, [place] Recommendations  None recommended by PT    Recommendations for Other Services       Precautions / Restrictions Precautions Precautions: Fall Restrictions Weight Bearing Restrictions: No      Mobility  Bed Mobility Overal bed mobility: Needs Assistance Bed Mobility: Supine to Sit     Supine to sit: Supervision        Transfers Overall transfer level: Needs assistance Equipment used: None Transfers: Sit to/from Stand;Stand Pivot Transfers Sit to Stand: Supervision Stand pivot transfers: Supervision       General transfer comment: Pt was able to stand and pivot and used armrests to  reach for chair and turn feet and sit down.  Good safety with slightly flexed posture.   Ambulation/Gait             General Gait Details: Refused ambullation today.  States he will walk next time.   Stairs            Wheelchair Mobility    Modified Rankin (Stroke Patients Only)       Balance Overall balance assessment: Needs assistance         Standing balance support: No upper extremity supported;During functional activity Standing balance-Leahy Scale: Poor Standing balance comment: relies on UE for support.                             Pertinent Vitals/Pain Pain Assessment: No/denies pain  Did not remove O2 as pt had episode when MD removed this am.  97% on 2LO2.  Other VSS.      Home Living Family/patient expects to be discharged to:: Private residence Living Arrangements: Alone Available Help at Discharge: Available PRN/intermittently Type of Home: Independent living facility Home Access: Level entry     Home Layout: One level Home Equipment: [place] - 4 wheels;Wheelchair - manual;Toilet riser;Shower seat;Grab bars - tub/shower;Grab bars - toilet;Other (comment) (sits in recliner most of day per pt) Additional Comments: Lives at [facility] in [place] per pt.  They provide meals and cleaning only.     Prior Function Level of Independence: Independent with assistive device(s)         Comments: Has had a recent decline.  Has went to two facilities for therapy but felt he didn't get therapy and he has not recovered from his illnesses before going home per pt.  Wants to go home to I living and get HHPT.      Hand Dominance        Extremity/Trunk Assessment   Upper Extremity Assessment: Defer to OT evaluation           Lower Extremity Assessment: Generalized weakness      Cervical / Trunk Assessment: Normal  Communication   Communication: HOH  Cognition Arousal/Alertness: Awake/alert Behavior During Therapy: WFL for  tasks assessed/performed Overall Cognitive Status: Within Functional Limits for tasks assessed                      General Comments General comments (skin integrity, edema, etc.): bil LE edema noted.    Exercises        Assessment/Plan    PT Assessment Patient needs continued PT services  PT Diagnosis Generalized weakness   PT Problem List Decreased balance;Decreased mobility;Decreased coordination;Decreased knowledge of use of DME;Decreased safety awareness;Decreased knowledge of precautions;Decreased activity tolerance  PT Treatment Interventions DME instruction;Gait training;Functional mobility training;Therapeutic activities;Therapeutic exercise;Balance training;Patient/family education   PT Goals (Current goals can be found in the Care Plan section) Acute Rehab PT Goals Patient Stated Goal: to get better PT Goal Formulation: With patient Time For Goal Achievement: 07/15/15 Potential to Achieve Goals: Good    Frequency Min 3X/week   Barriers to discharge        Co-evaluation               End of Session Equipment Utilized During Treatment: Gait belt;Oxygen Activity Tolerance: Patient limited by fatigue Patient left: in chair;with call bell/phone within reach (floor has not chair alarm pads, nurse aware) Nurse Communication: Mobility status         Time: 2952-8413 PT Time Calculation (min) (ACUTE ONLY): 30 min   Charges:   PT Evaluation $Initial PT Evaluation Tier I: 1 Procedure PT Treatments $Therapeutic Activity: 8-22 mins   PT G CodesDenice Paradise Jul 07, 2015, 12:02 PM Lavina Resor,PT Acute Rehabilitation [phone] (pager)

## 2015-07-01 NOTE — Progress Notes (Signed)
*  Preliminary Results* Right upper extremity venous duplex completed. Right upper extremity is negative for deep vein thrombosis. There is evidence of superficial vein thrombosis involving the right cephalic vein  Preliminary results discussed with Sharyn Lull, RN.  07/01/2015 4:39 PM  Maudry Mayhew, RVT, RDCS, RDMS

## 2015-07-01 NOTE — Evaluation (Signed)
Occupational Therapy Evaluation Patient Details Name: Eugene Bauer MRN: [id number] DOB: 14-Feb-1927 Today's Date: 07/01/2015    History of Present Illness Eugene Bauer is a 81 y.o. male with PMH of hyperlipidemia, diabetes mellitus, COPD, GERD, CAD, s/p of stent placement, diastolic congestive heart failure, gastric ulcer, DJD, hepatitis, chronic back pain, right retinal embolus, stroke, prostate cancer (post status of radiation therapy), left nose basal cell carcinoma, DVT, bilateral leg stent placement for possible PAD, recently treated pneumonia, anemia, who presents with generalized weakness, cough and chest pain.   Clinical Impression   Pt was functioning at a modified independent level prior to admission. He presents with generalized weakness and decreased activity tolerance requiring supervision.  Plan is for pt to return to his home with [facility] therapies. Will follow acutely. Of note, pt has a strong preference for using a rollator.    Follow Up Recommendations  Home health OT    Equipment Recommendations  None recommended by OT    Recommendations for Other Services       Precautions / Restrictions Precautions Precautions: Fall Restrictions Weight Bearing Restrictions: No      Mobility Bed Mobility  Pt in chair.                Transfers Overall transfer level: Needs assistance Equipment used: None Transfers: Sit to/from [facility] Sit to Stand: Supervision Stand pivot transfers: Supervision       General transfer comment: no physical assist, pt using bed rail and arms of chair    Balance             Standing balance-Leahy Scale: Poor                              ADL Overall ADL's : Needs assistance/impaired Eating/Feeding: Independent;Sitting Eating/Feeding Details (indicate cue type and reason): able to open packages with difficulty Grooming: Wash/dry hands;Wash/dry face;Set up;Sitting   Upper Body Bathing: Set  up;Sitting   Lower Body Bathing: Supervison/ safety;Sit to/from stand   Upper Body Dressing : Set up;Sitting   Lower Body Dressing: Supervision/safety;Sit to/from stand;Sitting/lateral leans Lower Body Dressing Details (indicate cue type and reason): able to don and doff socks Toilet Transfer: Supervision/safety;Stand-pivot   Toileting- Clothing Manipulation and Hygiene: Supervision/safety;Sit to/from stand       Functional mobility during ADLs:  (pt declined ambulation in the absence of a rollator) General ADL Comments: Pt reports he requires an extended time to perform ADL at home.     Vision Additional Comments: cannot read small print   Perception     Praxis      Pertinent Vitals/Pain Pain Assessment: No/denies pain     Hand Dominance Right   Extremity/Trunk Assessment Upper Extremity Assessment Upper Extremity Assessment: RUE deficits/detail;LUE deficits/detail RUE Deficits / Details: moderate edema, pt with shoulder flexion to 90, was told by MD not to raise his arms over his head per his report LUE Deficits / Details: pt with shoulder flexion to 90, was told by MD not to raise his arms above his head   Lower Extremity Assessment Lower Extremity Assessment: Defer to PT evaluation   Cervical / Trunk Assessment Cervical / Trunk Assessment: Normal   Communication Communication Communication: HOH   Cognition Arousal/Alertness: Awake/alert Behavior During Therapy: WFL for tasks assessed/performed Overall Cognitive Status: Within Functional Limits for tasks assessed  General Comments       Exercises       Shoulder Instructions      Home Living Family/patient expects to be discharged to:: Private residence Living Arrangements: Alone Available Help at Discharge: Available PRN/intermittently Type of Home: Independent living facility Home Access: Level entry     Home Layout: One level     Bathroom Shower/Tub: Emergency planning/management officer: Handicapped height Bathroom Accessibility: Yes   Home Equipment: Environmental consultant - 4 wheels;Wheelchair - manual;Toilet riser;Shower seat;Grab bars - tub/shower;Grab bars - toilet (02)   Additional Comments: Lives at [facility] in [place]. Meals are brought to him and housekeeping provided.      Prior Functioning/Environment Level of Independence: Independent with assistive device(s)        Comments: Has had a recent decline.  Has went to two facilities for therapy but felt he didn't get therapy and he has not recovered from his illnesses before going home per pt.  Wants to go home to I living and get HHPT.     OT Diagnosis: Generalized weakness   OT Problem List: Decreased strength;Decreased activity tolerance;Impaired balance (sitting and/or standing);Cardiopulmonary status limiting activity;Increased edema   OT Treatment/Interventions: Self-care/ADL training;DME and/or AE instruction;Balance training;Patient/family education    OT Goals(Current goals can be found in the care plan section) Acute Rehab OT Goals Patient Stated Goal: to get better OT Goal Formulation: With patient Time For Goal Achievement: 07/08/15 Potential to Achieve Goals: Good ADL Goals Pt Will Perform Grooming: with modified independence;standing Pt Will Perform Lower Body Bathing: with modified independence;sit to/from stand Pt Will Perform Lower Body Dressing: with modified independence;sit to/from stand Pt Will Transfer to Toilet: with modified independence;ambulating Pt Will Perform Toileting - Clothing Manipulation and hygiene: with modified independence;sit to/from stand Pt Will Perform Tub/Shower Transfer: Shower transfer;ambulating;shower seat;grab bars  OT Frequency: Min 2X/week   Barriers to D/C:            Co-evaluation              End of Session Equipment Utilized During Treatment: Gait belt;Oxygen  Activity Tolerance: Patient tolerated treatment well Patient left:  in chair;with call bell/phone within reach   Time: 1430-1500 OT Time Calculation (min): 30 min Charges:  OT General Charges $OT Visit: 1 Procedure OT Evaluation $Initial OT Evaluation Tier I: 1 Procedure OT Treatments $Self Care/Home Management : 8-22 mins G-Codes:    Malka So 07/01/2015, 3:22 PM  [phone]

## 2015-07-02 ENCOUNTER — Ambulatory Visit (HOSPITAL_COMMUNITY): Payer: Medicare Other

## 2015-07-02 ENCOUNTER — Inpatient Hospital Stay (HOSPITAL_COMMUNITY): Payer: Medicare Other

## 2015-07-02 DIAGNOSIS — J449 Chronic obstructive pulmonary disease, unspecified: Secondary | ICD-10-CM

## 2015-07-02 DIAGNOSIS — I25119 Atherosclerotic heart disease of native coronary artery with unspecified angina pectoris: Secondary | ICD-10-CM

## 2015-07-02 DIAGNOSIS — R195 Other fecal abnormalities: Secondary | ICD-10-CM | POA: Insufficient documentation

## 2015-07-02 DIAGNOSIS — R079 Chest pain, unspecified: Secondary | ICD-10-CM

## 2015-07-02 DIAGNOSIS — J441 Chronic obstructive pulmonary disease with (acute) exacerbation: Secondary | ICD-10-CM

## 2015-07-02 DIAGNOSIS — D649 Anemia, unspecified: Secondary | ICD-10-CM

## 2015-07-02 LAB — BASIC METABOLIC PANEL
Anion gap: 5 (ref 5–15)
BUN: 19 mg/dL (ref 6–20)
CALCIUM: 8.2 mg/dL — AB (ref 8.9–10.3)
CO2: 27 mmol/L (ref 22–32)
CREATININE: 1.16 mg/dL (ref 0.61–1.24)
Chloride: 100 mmol/L — ABNORMAL LOW (ref 101–111)
GFR, EST NON AFRICAN AMERICAN: 56 mL/min — AB (ref >60)
Glucose, Bld: 218 mg/dL — ABNORMAL HIGH (ref 65–99)
Potassium: 4.3 mmol/L (ref 3.5–5.1)
SODIUM: 132 mmol/L — AB (ref 135–145)

## 2015-07-02 LAB — LEGIONELLA PNEUMOPHILA SEROGP 1 UR AG: L. pneumophila Serogp 1 Ur Ag: NEGATIVE

## 2015-07-02 LAB — CBC WITH DIFFERENTIAL/PLATELET
BASOS PCT: 0 %
Basophils Absolute: 0 10*3/uL (ref 0.0–0.1)
EOS ABS: 0 10*3/uL (ref 0.0–0.7)
Eosinophils Relative: 0 %
HCT: 19.2 % — ABNORMAL LOW (ref 39.0–52.0)
HEMOGLOBIN: 6.3 g/dL — AB (ref 13.0–17.0)
Lymphocytes Relative: 8 %
Lymphs Abs: 0.6 10*3/uL — ABNORMAL LOW (ref 0.7–4.0)
MCH: 27.9 pg (ref 26.0–34.0)
MCHC: 32.8 g/dL (ref 30.0–36.0)
MCV: 85 fL (ref 78.0–100.0)
MONOS PCT: 14 %
Monocytes Absolute: 1.1 10*3/uL — ABNORMAL HIGH (ref 0.1–1.0)
NEUTROS PCT: 78 %
Neutro Abs: 5.8 10*3/uL (ref 1.7–7.7)
PLATELETS: 206 10*3/uL (ref 150–400)
RBC: 2.26 MIL/uL — ABNORMAL LOW (ref 4.22–5.81)
RDW: 16.6 % — AB (ref 11.5–15.5)
WBC: 7.4 10*3/uL (ref 4.0–10.5)

## 2015-07-02 LAB — UREA NITROGEN, URINE: Urea Nitrogen, Ur: 579 mg/dL

## 2015-07-02 LAB — HEMOGLOBIN AND HEMATOCRIT, BLOOD
HCT: 33.9 % — ABNORMAL LOW (ref 39.0–52.0)
Hemoglobin: 11.4 g/dL — ABNORMAL LOW (ref 13.0–17.0)

## 2015-07-02 LAB — HEMOGLOBIN A1C
Hgb A1c MFr Bld: 7.2 % — ABNORMAL HIGH (ref 4.8–5.6)
Mean Plasma Glucose: 160 mg/dL

## 2015-07-02 LAB — GLUCOSE, CAPILLARY
GLUCOSE-CAPILLARY: 253 mg/dL — AB (ref 65–99)
Glucose-Capillary: 253 mg/dL — ABNORMAL HIGH (ref 65–99)
Glucose-Capillary: 228 mg/dL — ABNORMAL HIGH (ref 65–99)
Glucose-Capillary: 196 mg/dL — ABNORMAL HIGH (ref 65–99)

## 2015-07-02 LAB — PREPARE RBC (CROSSMATCH)

## 2015-07-02 MED ORDER — GUAIFENESIN ER 600 MG PO TB12
600.0000 mg | ORAL_TABLET | Freq: Two times a day (BID) | ORAL | Status: DC
  Administered 2015-07-02 – 2015-07-07 (×11): 600 mg via ORAL
  Filled 2015-07-02 (×11): qty 1

## 2015-07-02 MED ORDER — SODIUM CHLORIDE 0.9 % IV SOLN
Freq: Once | INTRAVENOUS | Status: AC
  Administered 2015-07-03: 11:00:00 via INTRAVENOUS

## 2015-07-02 MED ORDER — PANTOPRAZOLE SODIUM 40 MG PO TBEC
40.0000 mg | DELAYED_RELEASE_TABLET | Freq: Two times a day (BID) | ORAL | Status: DC
Start: 2015-07-02 — End: 2015-07-07
  Administered 2015-07-02 – 2015-07-07 (×11): 40 mg via ORAL
  Filled 2015-07-02 (×12): qty 1

## 2015-07-02 MED ORDER — NITROGLYCERIN 2 % TD OINT
0.5000 [in_us] | TOPICAL_OINTMENT | Freq: Three times a day (TID) | TRANSDERMAL | Status: DC
  Administered 2015-07-02 – 2015-07-05 (×9): 0.5 [in_us] via TOPICAL
  Filled 2015-07-02 (×3): qty 30

## 2015-07-02 MED ORDER — FUROSEMIDE 10 MG/ML IJ SOLN
40.0000 mg | Freq: Once | INTRAMUSCULAR | Status: AC
  Administered 2015-07-03: 40 mg via INTRAVENOUS
  Filled 2015-07-02: qty 4

## 2015-07-02 MED ORDER — FUROSEMIDE 10 MG/ML IJ SOLN
40.0000 mg | Freq: Once | INTRAMUSCULAR | Status: AC
  Administered 2015-07-02: 40 mg via INTRAVENOUS

## 2015-07-02 MED ORDER — SODIUM CHLORIDE 0.9 % IV SOLN
Freq: Once | INTRAVENOUS | Status: AC
  Administered 2015-07-02: 06:00:00 via INTRAVENOUS

## 2015-07-02 MED ORDER — FUROSEMIDE 10 MG/ML IJ SOLN
INTRAMUSCULAR | Status: AC
Start: 2015-07-02 — End: 2015-07-03
  Filled 2015-07-02: qty 4

## 2015-07-02 MED ORDER — INSULIN GLARGINE 100 UNIT/ML ~~LOC~~ SOLN
8.0000 [IU] | Freq: Every day | SUBCUTANEOUS | Status: DC
  Administered 2015-07-02: 8 [IU] via SUBCUTANEOUS
  Filled 2015-07-02 (×2): qty 0.08

## 2015-07-02 MED ORDER — FUROSEMIDE 10 MG/ML IJ SOLN
INTRAMUSCULAR | Status: AC
  Filled 2015-07-02: qty 4

## 2015-07-02 MED ORDER — METHYLPREDNISOLONE SODIUM SUCC 125 MG IJ SOLR
60.0000 mg | Freq: Two times a day (BID) | INTRAMUSCULAR | Status: DC
Start: 2015-07-02 — End: 2015-07-03
  Administered 2015-07-02 – 2015-07-03 (×3): 60 mg via INTRAVENOUS
  Filled 2015-07-02 (×3): qty 2

## 2015-07-02 NOTE — Progress Notes (Signed)
CRITICAL VALUE ALERT  Critical value received:  Hemoglobin 6.3   Date of notification:  07/02/15  Time of notification:  0403  Critical value read back: yes  Nurse who received alert: Leary Roca, RN  MD notified (1st page): Lamar Blinks, NP  Time of first page:  0405  Responding MD: Lamar Blinks, NP  Time MD responded:  [phone]

## 2015-07-02 NOTE — Progress Notes (Signed)
Physical Therapy Treatment Patient Details Name: Eugene Bauer MRN: [id number] DOB: [birth year] Today's Date: 07/02/2015    History of Present Illness Eugene Bauer is a 81 y.o. male with PMH of hyperlipidemia, diabetes mellitus, COPD, GERD, CAD, s/p of stent placement, diastolic congestive heart failure, gastric ulcer, DJD, hepatitis, chronic back pain, right retinal embolus, stroke, prostate cancer (post status of radiation therapy), left nose basal cell carcinoma, DVT, bilateral leg stent placement for possible PAD, recently treated pneumonia, anemia, who presents with generalized weakness, cough and chest pain.    PT Comments    Pt admitted with above diagnosis. Pt currently with functional limitations due to balance and endurance deficits. Pt ambulates well with 4 wheeled RW.  Should be ok to go to I living and use 4 wheeled RW safely.   Pt will benefit from skilled PT to increase their independence and safety with mobility to allow discharge to the venue listed below.    Follow Up Recommendations  Home health PT;Supervision - Intermittent     Equipment Recommendations  None recommended by PT    Recommendations for Other Services       Precautions / Restrictions Precautions Precautions: Fall Restrictions Weight Bearing Restrictions: No    Mobility  Bed Mobility Overal bed mobility: Needs Assistance Bed Mobility: Supine to Sit     Supine to sit: Supervision     General bed mobility comments: Took incr time for pt to get to EOB today.   Transfers Overall transfer level: Needs assistance Equipment used: 4-wheeled walker Transfers: Sit to/from Stand Sit to Stand: Supervision         General transfer comment: no physical assist, pt using bed rail and bed  Ambulation/Gait Ambulation/Gait assistance: Min guard;Supervision Ambulation Distance (Feet): 200 Feet (100 feet x2 with 1 sitting rest break in hall on 4 wheel RW ) Assistive device: 4-wheeled walker Gait  Pattern/deviations: Step-through pattern;Decreased stride length;Wide base of support   Gait velocity interpretation: <1.8 ft/sec, indicative of risk for recurrent falls General Gait Details: Pt safe with 4 wheeled RW.  Maneuvers it well.  Able to sit and rest when needed.     Stairs            Wheelchair Mobility    Modified Rankin (Stroke Patients Only)       Balance Overall balance assessment: Needs assistance         Standing balance support: Bilateral upper extremity supported;During functional activity Standing balance-Leahy Scale: Poor Standing balance comment: relies on UE for support but safe with RW.                     Cognition Arousal/Alertness: Awake/alert Behavior During Therapy: WFL for tasks assessed/performed Overall Cognitive Status: Within Functional Limits for tasks assessed                      Exercises      General Comments  Pt coughed a lot therefore asked RT if pt may need acapella device to looses secretions and RT to address.      Pertinent Vitals/Pain Pain Assessment: No/denies pain  VSS    Home Living                      Prior Function            PT Goals (current goals can now be found in the care plan section) Progress towards PT goals: Progressing toward goals    Frequency  Min 3X/week    PT Plan Current plan remains appropriate    Co-evaluation             End of Session Equipment Utilized During Treatment: Gait belt;Oxygen Activity Tolerance: Patient limited by fatigue Patient left: in chair;with call bell/phone within reach     Time: 0955-1029 PT Time Calculation (min) (ACUTE ONLY): 34 min  Charges:  $Gait Training: 23-37 mins                    G CodesDenice Paradise 2015-07-24, 12:25 PM Erroll Wilbourne,PT Acute Rehabilitation [phone] (pager)

## 2015-07-02 NOTE — Progress Notes (Signed)
  Echocardiogram 2D Echocardiogram has been performed.  Eugene Bauer 07/02/2015, 1:27 PM

## 2015-07-02 NOTE — Progress Notes (Signed)
Inpatient Diabetes Program Recommendations  AACE/ADA: New Consensus Statement on Inpatient Glycemic Control (2015)  Target Ranges:  Prepandial:   less than 140 mg/dL      Peak postprandial:   less than 180 mg/dL (1-2 hours)      Critically ill patients:  140 - 180 mg/dL   Results for [name], [name] (MRN [id number]) as of 07/02/2015 11:17  Ref. Range 07/01/2015 07:22 07/01/2015 12:19 07/01/2015 16:42 07/01/2015 21:55  Glucose-Capillary Latest Ref Range: 65-99 mg/dL 184 (H) 235 (H) 223 (H) 273 (H)    Results for [name], [name] (MRN [id number]) as of 07/02/2015 11:17  Ref. Range 07/02/2015 08:22  Glucose-Capillary Latest Ref Range: 65-99 mg/dL 253 (H)    Results for [name], [name] (MRN [id number]) as of 07/02/2015 11:17  Ref. Range 07/01/2015 07:50  Hemoglobin A1C Latest Ref Range: 4.8-5.6 % 7.2 (H)    Admit with: Sepsis  History: DM, COPD, CHF  Home DM Meds: Metformin 500 mg bid  Current Insulin Orders: Novolog Sensitive SSI (0-9 units) TID AC    -Patient currently receiving IV Solumedrol 60 mg bid.  -Glucose levels consistently elevated >200 mg/dl.    MD- Please consider the following in-hospital insulin adjustments while patient receiving IV steroids:  1. Start low dose basal insulin- Recommend Lantus 11 units daily (0.15 units/kg dosing)  2. Increase Novolog SSI to Moderate scale (0-15 units) TID AC + HS    --Will follow patient during hospitalization--  Wyn Quaker RN, MSN, CDE Diabetes Coordinator Inpatient Glycemic Control Team Team Pager: [phone] (8a-5p)

## 2015-07-02 NOTE — Progress Notes (Signed)
Pt HR went to 130- 140 for 20 minutes @ 2110 to 2135. EKG was showed ST. Pt was asymptomatic. NP Schorr was notified. Pt HR went down in 90's @ 2136

## 2015-07-02 NOTE — Progress Notes (Signed)
CSW received consult that pt is from [place] called facility to confirm.  Pt was discharged from [facility] on 10/27  Per PT eval home health is recommended- no CSW involvement needed at this time.  CSW signing off.  Domenica Reamer, [place] Social Worker [phone]

## 2015-07-02 NOTE — Progress Notes (Addendum)
[facility] PROGRESS NOTE  Shermar Friedland VOZ:[id number] DOB: [birth year] DOA: 06/30/2015  [facility]  Brief HPI: 81 year old Caucasian male with a past medical history of moderate to severe COPD on home oxygen, history of aortic stenosis, chronic diastolic congestive heart failure, coronary artery disease, peripheral vascular disease was diagnosed with pneumonia on October 16. He was treated with oral antibiotics for same. He also was found to have positive fecal occult blood testing. Patient presented with fever, shortness of breath, chills. Family felt that patient had not responded appropriately to oral antibiotics. He was hospitalized for further management.  Past medical history:  Past Medical History  Diagnosis Date  . Aortic stenosis   . High cholesterol   . Retinal artery occlusion   . GERD (gastroesophageal reflux disease)   . Coronary artery disease   . Heart murmur   . On home oxygen therapy     "2L all the time" (01/02/2013)  . Angina   . Hypertension   . CHF (congestive heart failure) ([place])   . MI (myocardial infarction) ([place])     "I've had several; last one was 03/09/2012" (01/02/2013  . Asthma   . Pneumonia     "today; have had it at least twice before" (01/02/2013)  . Shortness of breath     "most of the time recently" (01/02/2013)  . Chronic bronchitis ([place])   . History of stomach ulcers     "have them now; they flare up when I eat the wrong thing or don't eat" (01/02/2013)  . Hepatitis     "don't know which one" (01/02/2013)  . QQVZDGLO(756.4)     "probably weekly" (01/02/2013)  . Arthritis     "all over" (01/02/2013)  . DJD (degenerative joint disease)     "all over" (01/02/2013)  . Chronic lower back pain   . Urinary urgency   . Anemia     chronic/notes 01/02/2013  . Retinal embolus, right eye 2010  . Stroke [facility])     "several; left ankle/foot still weak from one of them" (01/02/2013), 2006  . COPD (chronic obstructive pulmonary disease) ([place])   . Emphysema    . Type II diabetes mellitus ([place])     Controlled by Metformin  . Prostate cancer [facility])     "had radiation treatments" (01/02/2013)  . Basal cell carcinoma     "left nose, behind right knee" (01/02/2013)    Consultants: Gastroenterology  Procedures: None  Antibiotics: Vancomycin and Zosyn   Subjective: Patient denies any new complaints. He states that his breathing is improving. Continues to have a cough. No nausea, vomiting. Per nurse, patient had been complaining of on-and-off chest pain all night. He has been noted to be anxious.  Objective: Vital Signs  Filed Vitals:   07/02/15 0552 07/02/15 0608 07/02/15 0630 07/02/15 0700  BP: 149/85  131/74 131/58  Pulse: 105  98 93  Temp: 97.5 F (36.4 C)  97.5 F (36.4 C)   TempSrc: Oral  Oral   Resp: 17  21 17   Height:      Weight:      SpO2: 96% 97% 95% 97%    Intake/Output Summary (Last 24 hours) at 07/02/15 0741 Last data filed at 07/02/15 0534  Gross per 24 hour  Intake 2220.83 ml  Output    250 ml  Net 1970.83 ml   Filed Weights   07/01/15 0035  Weight: 76.4 kg (168 lb 6.9 oz)    General appearance: alert, cooperative, appears stated age  and no distress Resp: Coarse breath sounds bilaterally with wheezing. Few crackles at the bases. No rhonchi. Cardio: regular rate and rhythm, S1, S2 normal, no murmur, click, rub or gallop GI: soft, non-tender; bowel sounds normal; no masses,  no organomegaly Extremities: extremities normal, atraumatic, no cyanosis or edema Neurologic: No focal deficits  Lab Results:  Basic Metabolic Panel:  Recent Labs Lab 06/30/15 1831 06/30/15 2231 07/01/15 1240 07/02/15 0227  NA 133*  --  134* 132*  K 4.4  --  4.6 4.3  CL 94*  --  100* 100*  CO2 24  --  29 27  GLUCOSE 162*  --  256* 218*  BUN 16  --  16 19  CREATININE 1.25* 1.12 1.10 1.16  CALCIUM 9.4  --  8.6* 8.2*   Liver Function Tests:  Recent Labs Lab 07/01/15 1240  AST 17  ALT 14*  ALKPHOS 60  BILITOT 0.5  PROT  5.1*  ALBUMIN 2.1*   CBC:  Recent Labs Lab 06/30/15 1831 07/01/15 1240 07/02/15 0227  WBC 8.8 6.7 7.4  NEUTROABS  --  6.1 5.8  HGB 8.4* 7.1* 6.3*  HCT 25.9* 21.1* 19.2*  MCV 86.0 85.1 85.0  PLT 224 183 206   Cardiac Enzymes:  Recent Labs Lab 07/01/15 0111 07/01/15 0750 07/01/15 1240  TROPONINI 0.04* 0.04* 0.03   BNP (last 3 results)  Recent Labs  06/30/15 2233  BNP 793.7*    CBG:  Recent Labs Lab 06/30/15 2241 07/01/15 0722 07/01/15 1219 07/01/15 1642 07/01/15 2155  GLUCAP 167* 184* 235* 223* 273*    Recent Results (from the past 240 hour(s))  Culture, blood (routine x 2)     Status: None (Preliminary result)   Collection Time: 06/30/15  6:31 PM  Result Value Ref Range Status   Specimen Description BLOOD LEFT ARM  Final   Special Requests BOTTLES DRAWN AEROBIC AND ANAEROBIC 3.5CC  Final   Culture NO GROWTH < 24 HOURS  Final   Report Status PENDING  Incomplete  Culture, blood (routine x 2)     Status: None (Preliminary result)   Collection Time: 06/30/15  8:16 PM  Result Value Ref Range Status   Specimen Description BLOOD LEFT HAND  Final   Special Requests BOTTLES DRAWN AEROBIC AND ANAEROBIC 5CC  Final   Culture NO GROWTH < 24 HOURS  Final   Report Status PENDING  Incomplete  Urine culture     Status: None   Collection Time: 06/30/15  8:21 PM  Result Value Ref Range Status   Specimen Description URINE, CLEAN CATCH  Final   Special Requests Normal  Final   Culture MULTIPLE SPECIES PRESENT, SUGGEST RECOLLECTION  Final   Report Status 07/01/2015 FINAL  Final  MRSA PCR Screening     Status: None   Collection Time: 07/01/15  1:23 AM  Result Value Ref Range Status   MRSA by PCR NEGATIVE NEGATIVE Final    Comment:        The GeneXpert MRSA Assay (FDA approved for NASAL specimens only), is one component of a comprehensive MRSA colonization surveillance program. It is not intended to diagnose MRSA infection nor to guide or monitor treatment  for MRSA infections.       Studies/Results: Dg Chest 1 View  06/30/2015  CLINICAL DATA:  Cough in crackles.  Recently diagnosed pneumonia. EXAM: CHEST 1 VIEW COMPARISON:  06/08/2013. FINDINGS: The cardiac silhouette remains borderline enlarged. Minimal linear opacity at the left lateral lung base. Otherwise, clear lungs with  normal vascularity. No acute bony abnormality. IMPRESSION: 1. Small amount of probable scarring at the left lateral lung base. 2. Stable borderline cardiomegaly. Electronically Signed   By: Claudie Revering M.D.   On: 06/30/2015 19:08   US Renal  06/30/2015  CLINICAL DATA:  Acute kidney injury. EXAM: RENAL / URINARY TRACT ULTRASOUND COMPLETE COMPARISON:  Ultrasound dated 09/20/2010 FINDINGS: Right Kidney: Length: 12.7 cm. Three simple cysts with the largest being 4.5 cm, unchanged since the prior study. Slightly echogenic renal parenchyma. No solid mass or hydronephrosis visualized. Left Kidney: Length: 12.5 cm. Slightly echogenic renal parenchyma. No mass or hydronephrosis. Bladder: Appears normal for degree of bladder distention. Slight prominence of the prostate gland. IMPRESSION: No hydronephrosis or solid mass lesions. Slight increased echogenicity of the renal parenchyma suggesting renal medical disease. Electronically Signed   By: Lorriane Shire M.D.   On: 06/30/2015 21:51    Medications:  Scheduled: . sodium chloride   Intravenous Once  . antiseptic oral rinse  7 mL Mouth Rinse BID  . atorvastatin  80 mg Oral QHS  . budesonide (PULMICORT) nebulizer solution  0.5 mg Nebulization BID  . feeding supplement (GLUCERNA SHAKE)  237 mL Oral BID PC  . feeding supplement (PRO-STAT SUGAR FREE 64)  30 mL Oral Daily  . gabapentin  300 mg Oral QHS  . guaiFENesin  600 mg Oral BID  . insulin aspart  0-9 Units Subcutaneous TID WC  . ipratropium-albuterol  3 mL Nebulization QID  . latanoprost  1 drop Right Eye QHS  . methylPREDNISolone (SOLU-MEDROL) injection  60 mg Intravenous  Q12H  . metoprolol tartrate  12.5 mg Oral BID  . multivitamin with minerals  1 tablet Oral Daily  . nitroGLYCERIN  0.5 inch Topical 3 times per day  . pantoprazole  40 mg Oral BID  . piperacillin-tazobactam (ZOSYN)  IV  3.375 g Intravenous Q8H  . ranolazine  500 mg Oral BID  . vancomycin  1,000 mg Intravenous Q24H   Continuous: . sodium chloride Stopped (07/02/15 0619)   OZD:GUYQIHKVQ, nitroGLYCERIN, polyvinyl alcohol  Assessment/Plan:  Principal Problem:   Sepsis ([place]) Active Problems:   CAD (coronary artery disease)   COPD (chronic obstructive pulmonary disease) (H[facility])   Anemia   Diabetes mellitus (H[facility])   Diastolic CHF (HCC)   COPD exacerbation ([facility])   Acute on chronic respiratory failure (HCC)   DVT (deep venous thrombosis) (HCC)   AKI (acute kidney injury) ([place])   History of stroke   GERD (gastroesophageal reflux disease)   Malnutrition of moderate degree    Community-acquired pneumonia, failing outpatient treatment Currently on vancomycin and Zosyn. Blood cultures are negative so far. Dyspnea appears to be improving. Anemia is also contributing. Change to oral Levaquin tomorrow.  Normocytic anemia Hemoglobin noted to be trending down. No overt bleeding noted. Recently he was found to have positive FOBT at his SNF. Apparently, plan was for referral to gastroenterology, but it hasn't occurred yet. Patient reports that he's had a colonoscopy and upper endoscopy but many years ago. Denies any black stools. In view of downward trending hemoglobin we will go ahead and consult gastroenterology. Continue PPI. Change to by mouth liquid diet.  Prior history of coronary artery disease Apparently not amenable to intervention. Patient has been experiencing some chest pain. Etiology for this pain is not entirely clear. Troponins were not significantly elevated. This could all be angina from anemia. Nitroglycerin paste for now. Transfuse blood. Echocardiogram. EKG does not show any  ischemic changes.  History of  severe aortic stenosis Stable. As above.  History of COPD with probable mild exacerbation Continue nebulizer treatments. Continue steroids. Some improvement is noted.  History of peripheral vascular disease Hold his antiplatelet agent due to dropping hemoglobin. Continue to monitor closely.  History of essential hypertension Blood pressure was low at the time of admission. Antihypertensives on hold except for metoprolol. Continue to monitor. Her pressures stable currently.  History of diastolic congestive heart failure Appears to be well compensated. Continue to monitor.  History of type 2 diabetes with hyperglycemia High blood sugars is most secondary to steroids. Patient was on metformin at home. HbA1c is 7.2. Initiate Lantus for now.  Superficial Vein Thrombosis Doppler revealed acute superficial vein thrombosis involving the right cephalic vein. Symptomatic treatment.  ADDENDUM Patient became dyspneic after transfusions. CXR suggests fluid overload. Lasix. BiPAP. Daughter updated at bedside.  DVT Prophylaxis: Change to SCDs    Code Status: DO NOT RESUSCITATE  Family Communication: Discussed with his daughter, Jeannene Patella  Disposition Plan: Continue to remain in step down unit for now.     LOS: 2 days   [place] [facility] Pager [phone] 07/02/2015, 7:41 AM  If 7PM-7AM, please contact night-coverage at [email or web address], password [facility]

## 2015-07-02 NOTE — Progress Notes (Signed)
RT summoned by RN reference to patient having significant wheezing.  Arrived to find patient pale, diaphoretic with labored respirations.  Expiratory wheezing noted with rales in bases.  MD paged.  RT suggested Bipap due to fluid overload concern.  Orders granted.   Patient placed on 10/5 and 45%  With significant reduction in WOB.  Respiratory will continue to monitor.

## 2015-07-02 NOTE — Progress Notes (Signed)
Spoke with MD about no urine output from patient in one hour, will continue to monitor at this time. No new orders received. Urban Gibson Lexine Baton

## 2015-07-02 NOTE — Progress Notes (Signed)
Spoke with Dr. Curly Rim about patients output. Order received for bladder scan and if scan showed greater than 400ML to place foley catheter. Bladder scan preformed and showed 450 ML of urine. Perineal care completed and Foley catheter placed with aseptic technique. 500 ML clear yellow urine returned. Patient tolerated well. Will continue to monitor output.

## 2015-07-02 NOTE — Consult Note (Signed)
Referring Provider: [facility] Primary Care Physician:  Pcp Not In System Primary Gastroenterologist:  unassigned  Reason for Consultation:  anemia    HPI: Eugene Bauer is a 81 y.o. male with past medical history prostate cancer status post radiation therapy, diabetes, aortic stenosis COPD, GERD, hyperlipidemia, CAD status post stent placement, diastolic CHF, gastric ulcers, hepatitis, DJD, chronic back pain, stroke, right retinal embolus, DVT, bilateral leg stent placement for possible PAD, leftcell carcinoma, anemia, and recent pneumonia. He presented to the emergency room on the 31st with complaints of cough, chest pain, and generalized weakness. He reports that he was hospitalized in the cells. [facility] in mid October at which time he was treated for pneumonia. He was discharged to rehabilitation. He states while in rehabilitation is noted to have a drop in his hemoglobin and stool cards were positive for blood. He reports that while he was in rehabilitation he retched on every meal and regurgitated every meal. Since then he feels full sooner than normal. His appetite has been decreased and he states that he has lost 10 pounds over the past several weeks. When he was found to have positive FOBT. He was sent to the cells. [place] emergency room where he was found to have hemoglobin of 8.9. The emergency room provider felt the patient was stable and discharged the patient to independent living with instructions to follow up with GI for an endoscopy, which the patient has not yet done. Patient has not had any change in his bowel habits or stool caliber. He does report that he has occasional red blood on the toilet tissue but states this has been intermittent since his prostate cancer. He reports that years ago she had to have "cauterization" at [facility] due to rectal bleeding. He does not recall ever having a full colonoscopy, and denies family history of colon cancer, colon polyps, or  inflammatory bowel disease. He states his ulcers were diagnosed with endoscopy many years ago at [facility] and he has not seen a gastroenterologist since. He has been on omeprazole for "a long time". He denies breakthrough heartburn but over the past several months has been having intermittent dysphagia to solids which seemed to get much worse when he was at rehabilitation and he began to regurgitate each meal.   Past Medical History  Diagnosis Date  . Aortic stenosis   . High cholesterol   . Retinal artery occlusion   . GERD (gastroesophageal reflux disease)   . Coronary artery disease   . Heart murmur   . On home oxygen therapy     "2L all the time" (01/02/2013)  . Angina   . Hypertension   . CHF (congestive heart failure) ([place])   . MI (myocardial infarction) ([place])     "I've had several; last one was 03/09/2012" (01/02/2013  . Asthma   . Pneumonia     "today; have had it at least twice before" (01/02/2013)  . Shortness of breath     "most of the time recently" (01/02/2013)  . Chronic bronchitis ([place])   . History of stomach ulcers     "have them now; they flare up when I eat the wrong thing or don't eat" (01/02/2013)  . Hepatitis     "don't know which one" (01/02/2013)  . QQIWLNLG(921.1)     "probably weekly" (01/02/2013)  . Arthritis     "all over" (01/02/2013)  . DJD (degenerative joint disease)     "all over" (01/02/2013)  . Chronic lower back pain   .  Urinary urgency   . Anemia     chronic/notes 01/02/2013  . Retinal embolus, right eye 2010  . Stroke [facility])     "several; left ankle/foot still weak from one of them" (01/02/2013), 2006  . COPD (chronic obstructive pulmonary disease) ([place])   . Emphysema   . Type II diabetes mellitus ([place])     Controlled by Metformin  . Prostate cancer [facility])     "had radiation treatments" (01/02/2013)  . Basal cell carcinoma     "left nose, behind right knee" (01/02/2013)    Past Surgical History  Procedure Laterality Date  . Shoulder arthroscopy w/ rotator cuff  repair Right 1990's; 2000's    "they've operated on it twice" (01/02/2013)  . Sp pta peripheral      Bilateral leg stents  . Inguinal hernia repair Bilateral     "1 wk apart" (01/02/2013)  . Coronary stent placement      "I've had 7 or 8 stents put in my heart" (01/02/2013)  . Cardiac catheterization      "couldn't get new type of stents put in" (01/02/2013)  . Coronary angioplasty with stent placement      "I've had 7 or 8 stents put in my heart; last stents put in ~ 2 wk ago" (01/02/2013)  . Back surgery    . Anterior fusion cervical spine      "C5-7; took bone off my right hip" (5-01/2013)    Prior to Admission medications   Medication Sig Start Date End Date Taking? Authorizing Provider  acetaminophen (TYLENOL) 500 MG tablet Take 500 mg by mouth every 6 (six) hours as needed for pain.   Yes Historical Provider, MD  albuterol (PROVENTIL HFA;VENTOLIN HFA) 108 (90 BASE) MCG/ACT inhaler Inhale 2 puffs into the lungs every 6 (six) hours as needed for wheezing.   Yes Historical Provider, MD  albuterol (PROVENTIL) (5 MG/ML) 0.5% nebulizer solution Take 0.5 mLs (2.5 mg total) by nebulization every 6 (six) hours as needed for wheezing or shortness of breath. 05/21/13  Yes Ejiroghene Arlyce Dice, MD  aspirin 81 MG tablet Take 81 mg by mouth daily.     Yes Historical Provider, MD  atorvastatin (LIPITOR) 80 MG tablet Take 80 mg by mouth at bedtime.   Yes Historical Provider, MD  gabapentin (NEURONTIN) 300 MG capsule Take 300 mg by mouth at bedtime.     Yes Historical Provider, MD  ipratropium (ATROVENT) 0.02 % nebulizer solution Take 2.5 mLs (0.5 mg total) by nebulization every 6 (six) hours as needed. 05/21/13  Yes Ejiroghene Arlyce Dice, MD  metFORMIN (GLUCOPHAGE) 500 MG tablet Take 500 mg by mouth 2 (two) times daily with a meal.   Yes Historical Provider, MD  metoprolol tartrate (LOPRESSOR) 25 MG tablet Take 12.5 mg by mouth 2 (two) times daily.   Yes Historical Provider, MD  mometasone [facility]) 220 MCG/INH  inhaler Inhale 2 puffs into the lungs at bedtime.    Yes Historical Provider, MD  nitroGLYCERIN (NITROSTAT) 0.4 MG SL tablet Place 0.4 mg under the tongue every 5 (five) minutes as needed for chest pain.   Yes Historical Provider, MD  Olodaterol HCl 2.5 MCG/ACT AERS Inhale 2.5 mcg into the lungs daily.   Yes Historical Provider, MD  omeprazole (PRILOSEC) 40 MG capsule Take 1 capsule (40 mg total) by mouth 2 (two) times daily before a meal. 01/05/13  Yes Shanker Kristeen Mans, MD  polyvinyl alcohol (LIQUIFILM TEARS) 1.4 % ophthalmic solution Place 1 drop into both eyes as  needed. Patient taking differently: Place 1 drop into both eyes as needed for dry eyes.  01/05/13  Yes Shanker Kristeen Mans, MD  ranolazine (RANEXA) 500 MG 12 hr tablet Take 500 mg by mouth 2 (two) times daily.     Yes Historical Provider, MD  Travoprost, BAK Free, (TRAVATAN) 0.004 % SOLN ophthalmic solution Place 1 drop into both eyes at bedtime.     Yes Historical Provider, MD    Current Facility-Administered Medications  Medication Dose Route Frequency Provider Last Rate Last Dose  . 0.9 %  sodium chloride infusion   Intravenous Continuous Nita Sells, MD   Stopped at 07/02/15 [phone]  . 0.9 %  sodium chloride infusion   Intravenous Once Bonnielee Haff, MD      . albuterol (PROVENTIL) (2.5 MG/3ML) 0.083% nebulizer solution 2.5 mg  2.5 mg Nebulization Q2H PRN Ivor Costa, MD   2.5 mg at 07/02/15 0608  . antiseptic oral rinse (CPC / CETYLPYRIDINIUM CHLORIDE 0.05%) solution 7 mL  7 mL Mouth Rinse BID Nita Sells, MD   7 mL at 07/01/15 2200  . atorvastatin (LIPITOR) tablet 80 mg  80 mg Oral QHS Ivor Costa, MD   80 mg at 07/01/15 2223  . budesonide (PULMICORT) nebulizer solution 0.5 mg  0.5 mg Nebulization BID Ivor Costa, MD   0.5 mg at 07/02/15 0744  . feeding supplement (GLUCERNA SHAKE) (GLUCERNA SHAKE) liquid 237 mL  237 mL Oral BID PC Reanne J Barbato, RD   237 mL at 07/01/15 1703  . feeding supplement (PRO-STAT SUGAR FREE 64)  liquid 30 mL  30 mL Oral Daily Reanne J Barbato, RD   30 mL at 07/01/15 1659  . gabapentin (NEURONTIN) capsule 300 mg  300 mg Oral QHS Ivor Costa, MD   300 mg at 07/01/15 2222  . guaiFENesin (MUCINEX) 12 hr tablet 600 mg  600 mg Oral BID Bonnielee Haff, MD   600 mg at 07/02/15 0946  . insulin aspart (novoLOG) injection 0-9 Units  0-9 Units Subcutaneous TID WC Ivor Costa, MD   5 Units at 07/02/15 1229  . insulin glargine (LANTUS) injection 8 Units  8 Units Subcutaneous Daily Bonnielee Haff, MD      . ipratropium-albuterol (DUONEB) 0.5-2.5 (3) MG/3ML nebulizer solution 3 mL  3 mL Nebulization QID Nita Sells, MD   3 mL at 07/02/15 1119  . latanoprost (XALATAN) 0.005 % ophthalmic solution 1 drop  1 drop Right Eye QHS Ivor Costa, MD   1 drop at 07/01/15 2223  . methylPREDNISolone sodium succinate (SOLU-MEDROL) 125 mg/2 mL injection 60 mg  60 mg Intravenous Q12H Bonnielee Haff, MD   60 mg at 07/02/15 0906  . metoprolol tartrate (LOPRESSOR) tablet 12.5 mg  12.5 mg Oral BID Ivor Costa, MD   12.5 mg at 07/02/15 0947  . multivitamin with minerals tablet 1 tablet  1 tablet Oral Daily Lorenda Peck, RD   1 tablet at 07/02/15 0947  . nitroGLYCERIN (NITROGLYN) 2 % ointment 0.5 inch  0.5 inch Topical 3 times per day Bonnielee Haff, MD   0.5 inch at 07/02/15 1230  . nitroGLYCERIN (NITROSTAT) SL tablet 0.4 mg  0.4 mg Sublingual Q5 min PRN Ivor Costa, MD   0.4 mg at 07/02/15 0741  . pantoprazole (PROTONIX) EC tablet 40 mg  40 mg Oral BID Bonnielee Haff, MD   40 mg at 07/02/15 0946  . piperacillin-tazobactam (ZOSYN) IVPB 3.375 g  3.375 g Intravenous Q8H Rachel L Rumbarger, RPH   3.375 g at 07/02/15  3419  . polyvinyl alcohol (LIQUIFILM TEARS) 1.4 % ophthalmic solution 1 drop  1 drop Both Eyes PRN Ivor Costa, MD      . ranolazine (RANEXA) 12 hr tablet 500 mg  500 mg Oral BID Ivor Costa, MD   500 mg at 07/02/15 0946  . vancomycin (VANCOCIN) IVPB 1000 mg/200 mL premix  1,000 mg Intravenous Q24H Valeda Malm Rumbarger, RPH    1,000 mg at 07/01/15 2220    Allergies as of 06/30/2015 - Review Complete 06/30/2015  Allergen Reaction Noted  . Iodine Shortness Of Breath 07/28/2011  . Iohexol  04/27/2010    Family History  Problem Relation Age of Onset  . Coronary artery disease Brother 17  . Coronary artery disease Sister     Social History   Social History  . Marital Status: Single    Spouse Name: N/A  . Number of Children: N/A  . Years of Education: N/A   Occupational History  . Not on file.   Social History Main Topics  . Smoking status: Never Smoker   . Smokeless tobacco: Former User    Types: Chew     Comment: 01/02/2013 "chewed in high school playing ball"  . Alcohol Use: No  . Drug Use: No  . Sexual Activity: No   Other Topics Concern  . Not on file   Social History Narrative    Review of Systems: Gen: Admits to fever and chills. Appetite has been diminished and he states he has lost 10 pounds. CV: Denies  angina, palpitations, syncope, orthopnea, PND, peripheral edema, and claudication. Has chest pain Resp: Admits to cough and dyspnea GI: Denies vomiting blood, jaundice, and fecal incontinence.  Complains of intermittent dysphagia to solids GU : Has burning on urination  MS:has chronic back pain and stiffness  Derm: Denies rash, itching, dry skin, hives, moles, warts, or unhealing ulcers.  Psych: Denies depression, anxiety, memory loss, suicidal ideation, hallucinations, paranoia, and confusion. Heme: Denies bruising, bleeding, and enlarged lymph nodes. Neuro:  Denies any headache, paresthesias.  Physical Exam: Vital signs in last 24 hours: Temp:  [97.5 F (36.4 C)-98.2 F (36.8 C)] 98.2 F (36.8 C) (11/02 1130) Pulse Rate:  [79-105] 87 (11/02 1122) Resp:  [14-24] 22 (11/02 1122) BP: (113-181)/(45-103) 151/56 mmHg (11/02 1130) SpO2:  [90 %-100 %] 100 % (11/02 1122) FiO2 (%):  [100 %] 100 % (11/01 1615) Last BM Date: 06/29/15 General:   Alert,  Well-developed,  well-nourished, pleasant and cooperative in NAD Head:  Normocephalic and atraumatic. Eyes:  Sclera clear, no icterus. Conjunctiva pink. Ears:  Normal auditory acuity. Nose:  No deformity, discharge,  or lesions. Mouth:  No deformity or lesions.   Neck:  Supple; no masses or thyromegaly. Lungs:  Coarse breath sounds bilaterally . Heart:  Regular rate and rhythm; no murmurs, clicks, rubs,  or gallops. Abdomen:  Soft,nontender, BS active,nonpalp mass or hsm.   Rectal:  Deferred  Msk:  Symmetrical without gross deformities. . Pulses:  Normal pulses noted. Extremities: Without clubbing or edema. Neurologic:  Alert and  oriented x4;  grossly normal neurologically. Skin:  Intact without significant lesions or rashes.. Psych:  Alert and cooperative. Normal mood and affect.  Intake/Output from previous day: 11/01 0701 - 11/02 0700 In: 2220.8 [P.O.:800; I.V.:1070.8; IV Piggyback:350] Out: 550 [Urine:550] Intake/Output this shift: Total I/O In: 335 [Blood:335] Out: -   Lab Results:  Recent Labs  06/30/15 1831 07/01/15 1240 07/02/15 0227  WBC 8.8 6.7 7.4  HGB 8.4* 7.1* 6.3*  HCT 25.9* 21.1* 19.2*  PLT 224 183 206   BMET  Recent Labs  06/30/15 1831 06/30/15 2231 07/01/15 1240 07/02/15 0227  NA 133*  --  134* 132*  K 4.4  --  4.6 4.3  CL 94*  --  100* 100*  CO2 24  --  29 27  GLUCOSE 162*  --  256* 218*  BUN 16  --  16 19  CREATININE 1.25* 1.12 1.10 1.16  CALCIUM 9.4  --  8.6* 8.2*   LFT  Recent Labs  07/01/15 1240  PROT 5.1*  ALBUMIN 2.1*  AST 17  ALT 14*  ALKPHOS 60  BILITOT 0.5   PT/INR  Recent Labs  06/30/15 2231  LABPROT 14.3  INR 1.09   06/30/2015 fecal occult blood negative.   Studies/Results: Dg Chest 1 View  06/30/2015  CLINICAL DATA:  Cough in crackles.  Recently diagnosed pneumonia. EXAM: CHEST 1 VIEW COMPARISON:  06/08/2013. FINDINGS: The cardiac silhouette remains borderline enlarged. Minimal linear opacity at the left lateral lung base.  Otherwise, clear lungs with normal vascularity. No acute bony abnormality. IMPRESSION: 1. Small amount of probable scarring at the left lateral lung base. 2. Stable borderline cardiomegaly. Electronically Signed   By: Claudie Revering M.D.   On: 06/30/2015 19:08   US Renal  06/30/2015  CLINICAL DATA:  Acute kidney injury. EXAM: RENAL / URINARY TRACT ULTRASOUND COMPLETE COMPARISON:  Ultrasound dated 09/20/2010 FINDINGS: Right Kidney: Length: 12.7 cm. Three simple cysts with the largest being 4.5 cm, unchanged since the prior study. Slightly echogenic renal parenchyma. No solid mass or hydronephrosis visualized. Left Kidney: Length: 12.5 cm. Slightly echogenic renal parenchyma. No mass or hydronephrosis. Bladder: Appears normal for degree of bladder distention. Slight prominence of the prostate gland. IMPRESSION: No hydronephrosis or solid mass lesions. Slight increased echogenicity of the renal parenchyma suggesting renal medical disease. Electronically Signed   By: Lorriane Shire M.D.   On: 06/30/2015 21:51    IMPRESSION/PLAN:   Normocytic anemia. Hemoglobin again trending down and today was 6.3. Has received 1 unit of packed cells this morning and second unit is currently transfusing. Trend hemoglobin. Patient has a history of ulcers and several weeks ago had heme-positive stools. Will likely need EGD to evaluate for esophagitis, gastritis, ulcer. Continue pantoprazole.   Will review with attending as to possibility of colonoscopy in patient with history of aortic stenosis who may have AVMs as a source of his blood loss.   Marland Kitchen History severe aortic stenosis. Having echocardiogram today.   History of COPD with exacerbation. Currently on steroids and nebulizer treatments.  Community-acquired pneumonia. Currently on Zosyn and vancomycin.   History of peripheral vascular disease. Antiplatelet agent currently on hold due to dropping hemoglobin. History of diastolic CHF History of diabetes History of  hypertension, antihypertensive on hold . History of stroke hHstory of GERD     Aveyah Greenwood, Deloris Ping 07/02/2015,  Pager [phone]  Mon-Fri 8a-5p [phone] after 5p, weekends, holidays

## 2015-07-02 NOTE — Progress Notes (Signed)
Patient called RN to room,states he is having shortness of breath increased from earlier today. Patient O2 sat 93% on 3L O2. Lungs wet sounding on auscultation. MD paged with new findings order for lasix given. Will continue to monitor patient. Urban Gibson Lexine Baton

## 2015-07-03 ENCOUNTER — Inpatient Hospital Stay (HOSPITAL_COMMUNITY): Payer: Medicare Other | Admitting: Anesthesiology

## 2015-07-03 ENCOUNTER — Encounter (HOSPITAL_COMMUNITY)
Admission: EM | Disposition: A | Discharge status: Skilled Nursing Facility | Payer: Self-pay | Source: Home / Self Care | Attending: Internal Medicine

## 2015-07-03 ENCOUNTER — Encounter (HOSPITAL_COMMUNITY): Payer: Self-pay | Admitting: *Deleted

## 2015-07-03 DIAGNOSIS — K2960 Other gastritis without bleeding: Secondary | ICD-10-CM

## 2015-07-03 DIAGNOSIS — J9601 Acute respiratory failure with hypoxia: Secondary | ICD-10-CM

## 2015-07-03 DIAGNOSIS — J810 Acute pulmonary edema: Secondary | ICD-10-CM

## 2015-07-03 DIAGNOSIS — E1165 Type 2 diabetes mellitus with hyperglycemia: Secondary | ICD-10-CM

## 2015-07-03 DIAGNOSIS — Z794 Long term (current) use of insulin: Secondary | ICD-10-CM

## 2015-07-03 DIAGNOSIS — Z8711 Personal history of peptic ulcer disease: Secondary | ICD-10-CM | POA: Insufficient documentation

## 2015-07-03 HISTORY — PX: ESOPHAGOGASTRODUODENOSCOPY (EGD) WITH PROPOFOL: SHX5813

## 2015-07-03 LAB — CBC
HCT: 29 % — ABNORMAL LOW (ref 39.0–52.0)
HEMOGLOBIN: 10 g/dL — AB (ref 13.0–17.0)
MCH: 29 pg (ref 26.0–34.0)
MCHC: 34.5 g/dL (ref 30.0–36.0)
MCV: 84.1 fL (ref 78.0–100.0)
Platelets: 208 10*3/uL (ref 150–400)
RBC: 3.45 MIL/uL — ABNORMAL LOW (ref 4.22–5.81)
RDW: 16 % — AB (ref 11.5–15.5)
WBC: 8.2 10*3/uL (ref 4.0–10.5)

## 2015-07-03 LAB — TYPE AND SCREEN
ABO/RH(D): A POS
ANTIBODY SCREEN: NEGATIVE
UNIT DIVISION: 0
UNIT DIVISION: 0

## 2015-07-03 LAB — BASIC METABOLIC PANEL
ANION GAP: 10 (ref 5–15)
BUN: 16 mg/dL (ref 6–20)
CALCIUM: 8.6 mg/dL — AB (ref 8.9–10.3)
CO2: 26 mmol/L (ref 22–32)
CREATININE: 1.19 mg/dL (ref 0.61–1.24)
Chloride: 98 mmol/L — ABNORMAL LOW (ref 101–111)
GFR calc non Af Amer: 54 mL/min — ABNORMAL LOW (ref >60)
Glucose, Bld: 251 mg/dL — ABNORMAL HIGH (ref 65–99)
Potassium: 4 mmol/L (ref 3.5–5.1)
SODIUM: 134 mmol/L — AB (ref 135–145)

## 2015-07-03 LAB — GLUCOSE, CAPILLARY
Glucose-Capillary: 212 mg/dL — ABNORMAL HIGH (ref 65–99)
Glucose-Capillary: 203 mg/dL — ABNORMAL HIGH (ref 65–99)
Glucose-Capillary: 283 mg/dL — ABNORMAL HIGH (ref 65–99)
Glucose-Capillary: 228 mg/dL — ABNORMAL HIGH (ref 65–99)

## 2015-07-03 SURGERY — ESOPHAGOGASTRODUODENOSCOPY (EGD) WITH PROPOFOL
Anesthesia: Monitor Anesthesia Care

## 2015-07-03 MED ORDER — INSULIN GLARGINE 100 UNIT/ML ~~LOC~~ SOLN
10.0000 [IU] | Freq: Every day | SUBCUTANEOUS | Status: DC
  Administered 2015-07-03 – 2015-07-05 (×3): 10 [IU] via SUBCUTANEOUS
  Filled 2015-07-03 (×3): qty 0.1

## 2015-07-03 MED ORDER — PROPOFOL 500 MG/50ML IV EMUL
INTRAVENOUS | Status: DC | PRN
  Administered 2015-07-03: 50 ug/kg/min via INTRAVENOUS

## 2015-07-03 MED ORDER — LIDOCAINE HCL (CARDIAC) 20 MG/ML IV SOLN: INTRAVENOUS | Status: DC | PRN

## 2015-07-03 MED ORDER — SODIUM CHLORIDE 0.9 % IV SOLN: INTRAVENOUS | Status: DC

## 2015-07-03 MED ORDER — METHYLPREDNISOLONE SODIUM SUCC 125 MG IJ SOLR
60.0000 mg | Freq: Every day | INTRAMUSCULAR | Status: DC
  Administered 2015-07-04: 60 mg via INTRAVENOUS
  Filled 2015-07-03 (×2): qty 2

## 2015-07-03 MED ORDER — LIDOCAINE HCL (CARDIAC) 20 MG/ML IV SOLN
INTRAVENOUS | Status: DC | PRN
  Administered 2015-07-03: 50 mg via INTRAVENOUS

## 2015-07-03 MED ORDER — LEVOFLOXACIN IN D5W 750 MG/150ML IV SOLN
750.0000 mg | INTRAVENOUS | Status: DC
  Administered 2015-07-03 – 2015-07-05 (×2): 750 mg via INTRAVENOUS
  Filled 2015-07-03 (×2): qty 150

## 2015-07-03 MED ORDER — FUROSEMIDE 10 MG/ML IJ SOLN
40.0000 mg | Freq: Two times a day (BID) | INTRAMUSCULAR | Status: DC
  Administered 2015-07-03 – 2015-07-06 (×6): 40 mg via INTRAVENOUS
  Filled 2015-07-03 (×6): qty 4

## 2015-07-03 MED ORDER — BUTAMBEN-TETRACAINE-BENZOCAINE 2-2-14 % EX AERO
INHALATION_SPRAY | CUTANEOUS | Status: DC | PRN
  Administered 2015-07-03: 1 via TOPICAL

## 2015-07-03 MED ORDER — PROPOFOL 10 MG/ML IV BOLUS
INTRAVENOUS | Status: DC | PRN
  Administered 2015-07-03: 20 mg via INTRAVENOUS

## 2015-07-03 MED ORDER — PROPOFOL 10 MG/ML IV BOLUS: INTRAVENOUS | Status: DC | PRN

## 2015-07-03 NOTE — Anesthesia Postprocedure Evaluation (Signed)
  Anesthesia Post-op Note  Patient: Eugene Bauer  Procedure(s) Performed: Procedure(s): ESOPHAGOGASTRODUODENOSCOPY (EGD) WITH PROPOFOL (N/A)  Patient Location: PACU  Anesthesia Type:MAC  Level of Consciousness: awake and alert   Airway and Oxygen Therapy: Patient Spontanous Breathing  Post-op Pain: none  Post-op Assessment: Post-op Vital signs reviewed              Post-op Vital Signs: Reviewed  Last Vitals:  Filed Vitals:   07/03/15 1259  BP: 117/54  Pulse: 98  Temp:   Resp:     Complications: No apparent anesthesia complications

## 2015-07-03 NOTE — Progress Notes (Signed)
[facility] PROGRESS NOTE  Brenn Deziel JSE:[id number] DOB: 03-Sep-1926 DOA: 06/30/2015  [facility]  Brief HPI: 81 year old Caucasian male with a past medical history of moderate to severe COPD on home oxygen, history of aortic stenosis, chronic diastolic congestive heart failure, coronary artery disease, peripheral vascular disease was diagnosed with pneumonia on October 16. He was treated with oral antibiotics for same. He also was found to have positive fecal occult blood testing. Patient presented with fever, shortness of breath, chills. Family felt that patient had not responded appropriately to oral antibiotics. He was hospitalized for further management.  Past medical history:  Past Medical History  Diagnosis Date  . Aortic stenosis   . High cholesterol   . Retinal artery occlusion   . GERD (gastroesophageal reflux disease)   . Coronary artery disease   . Heart murmur   . On home oxygen therapy     "2L all the time" (01/02/2013)  . Angina   . Hypertension   . CHF (congestive heart failure) ([place])   . MI (myocardial infarction) ([place])     "I've had several; last one was 03/09/2012" (01/02/2013  . Asthma   . Pneumonia     "today; have had it at least twice before" (01/02/2013)  . Shortness of breath     "most of the time recently" (01/02/2013)  . Chronic bronchitis ([place])   . History of stomach ulcers     "have them now; they flare up when I eat the wrong thing or don't eat" (01/02/2013)  . Hepatitis     "don't know which one" (01/02/2013)  . WVPXTGGY(694.8)     "probably weekly" (01/02/2013)  . Arthritis     "all over" (01/02/2013)  . DJD (degenerative joint disease)     "all over" (01/02/2013)  . Chronic lower back pain   . Urinary urgency   . Anemia     chronic/notes 01/02/2013  . Retinal embolus, right eye 2010  . Stroke [facility])     "several; left ankle/foot still weak from one of them" (01/02/2013), 2006  . COPD (chronic obstructive pulmonary disease) ([place])   . Emphysema    . Type II diabetes mellitus ([place])     Controlled by Metformin  . Prostate cancer [facility])     "had radiation treatments" (01/02/2013)  . Basal cell carcinoma     "left nose, behind right knee" (01/02/2013)    Consultants: Gastroenterology  Procedures:  ECHO Study Conclusions - Left ventricle: The cavity size was normal. Wall thickness wasincreased in a pattern of mild LVH. There was focal basalhypertrophy. Systolic function was normal. The estimated ejectionfraction was in the range of 50% to 55%. Wall motion was normal;there were no regional wall motion abnormalities. - Aortic valve: There was moderate to severe stenosis. There wastrivial regurgitation. Valve area (VTI): 0.89 cm^2. Valve area(Vmax): 0.86 cm^2. Valve area (Vmean): 0.78 cm^2. - Mitral valve: Mildly to moderately calcified annulus. There wasmild regurgitation. Valve area by continuity equation (using LVOTflow): 2.19 cm^2. - Right atrium: The atrium was mildly dilated. - Tricuspid valve: There was mild-moderate regurgitation. - Pulmonary arteries: Systolic pressure was moderately increased.PA peak pressure: 62 mm Hg (S).  Antibiotics: Vancomycin and Zosyn until 11/3 Levaquin 11/3  Subjective: Feels better this morning. Not as short of breath as yesterday afternoon. Denies any chest pain. No nausea or vomiting. Tolerated BiPAP overnight.   Objective: Vital Signs  Filed Vitals:   07/03/15 0300 07/03/15 0337 07/03/15 0400 07/03/15 0500  BP: 97/63 97/63 101/57  113/59  Pulse: 117 67 70 73  Temp:  97.7 F (36.5 C)    TempSrc:  Axillary    Resp: 13 15 15 13   Height:      Weight:      SpO2: 99% 99% 100% 99%    Intake/Output Summary (Last 24 hours) at 07/03/15 0719 Last data filed at 07/03/15 0400  Gross per 24 hour  Intake    685 ml  Output    700 ml  Net    -15 ml   Filed Weights   07/01/15 0035  Weight: 76.4 kg (168 lb 6.9 oz)    General appearance: alert, cooperative, appears stated age and no  distress Resp: Less crackles compared to yesterday afternoon. No wheezing. No rhonchi. Improved air entry. Cardio: regular rate and rhythm, S1, S2 normal, no murmur, click, rub or gallop GI: soft, non-tender; bowel sounds normal; no masses,  no organomegaly Extremities: Some pedal edema is appreciated. Neurologic: No focal deficits  Lab Results:  Basic Metabolic Panel:  Recent Labs Lab 06/30/15 1831 06/30/15 2231 07/01/15 1240 07/02/15 0227 07/03/15 0434  NA 133*  --  134* 132* 134*  K 4.4  --  4.6 4.3 4.0  CL 94*  --  100* 100* 98*  CO2 24  --  29 27 26   GLUCOSE 162*  --  256* 218* 251*  BUN 16  --  16 19 16   CREATININE 1.25* 1.12 1.10 1.16 1.19  CALCIUM 9.4  --  8.6* 8.2* 8.6*   Liver Function Tests:  Recent Labs Lab 07/01/15 1240  AST 17  ALT 14*  ALKPHOS 60  BILITOT 0.5  PROT 5.1*  ALBUMIN 2.1*   CBC:  Recent Labs Lab 06/30/15 1831 07/01/15 1240 07/02/15 0227 07/02/15 1514 07/03/15 0434  WBC 8.8 6.7 7.4  --  8.2  NEUTROABS  --  6.1 5.8  --   --   HGB 8.4* 7.1* 6.3* 11.4* 10.0*  HCT 25.9* 21.1* 19.2* 33.9* 29.0*  MCV 86.0 85.1 85.0  --  84.1  PLT 224 183 206  --  208   Cardiac Enzymes:  Recent Labs Lab 07/01/15 0111 07/01/15 0750 07/01/15 1240  TROPONINI 0.04* 0.04* 0.03   BNP (last 3 results)  Recent Labs  06/30/15 2233  BNP 793.7*    CBG:  Recent Labs Lab 07/01/15 2155 07/02/15 0822 07/02/15 1220 07/02/15 1654 07/02/15 2125  GLUCAP 273* 253* 253* 228* 196*    Recent Results (from the past 240 hour(s))  Culture, blood (routine x 2)     Status: None (Preliminary result)   Collection Time: 06/30/15  6:31 PM  Result Value Ref Range Status   Specimen Description BLOOD LEFT ARM  Final   Special Requests BOTTLES DRAWN AEROBIC AND ANAEROBIC 3.5CC  Final   Culture NO GROWTH 2 DAYS  Final   Report Status PENDING  Incomplete  Culture, blood (routine x 2)     Status: None (Preliminary result)   Collection Time: 06/30/15  8:16 PM    Result Value Ref Range Status   Specimen Description BLOOD LEFT HAND  Final   Special Requests BOTTLES DRAWN AEROBIC AND ANAEROBIC 5CC  Final   Culture NO GROWTH 2 DAYS  Final   Report Status PENDING  Incomplete  Urine culture     Status: None   Collection Time: 06/30/15  8:21 PM  Result Value Ref Range Status   Specimen Description URINE, CLEAN CATCH  Final   Special Requests Normal  Final  Culture MULTIPLE SPECIES PRESENT, SUGGEST RECOLLECTION  Final   Report Status 07/01/2015 FINAL  Final  MRSA PCR Screening     Status: None   Collection Time: 07/01/15  1:23 AM  Result Value Ref Range Status   MRSA by PCR NEGATIVE NEGATIVE Final    Comment:        The GeneXpert MRSA Assay (FDA approved for NASAL specimens only), is one component of a comprehensive MRSA colonization surveillance program. It is not intended to diagnose MRSA infection nor to guide or monitor treatment for MRSA infections.       Studies/Results: Dg Chest Port 1 View  07/02/2015  CLINICAL DATA:  81 year old male with history of dyspnea. EXAM: PORTABLE CHEST 1 VIEW COMPARISON:  Chest x-ray 06/30/2015. FINDINGS: There is marked cephalization of the pulmonary vasculature, severe indistinctness of the interstitial markings, and extensive multifocal airspace disease throughout the lungs bilaterally suggestive of severe pulmonary edema. Small bilateral pleural effusions. Mild cardiomegaly. Upper mediastinal contours are within normal limits. Atherosclerosis in the thoracic aorta. IMPRESSION: 1. Findings, as above, suggestive of congestive heart failure. 2. Atherosclerosis. Electronically Signed   By: Vinnie Langton M.D.   On: 07/02/2015 16:24    Medications:  Scheduled: . sodium chloride   Intravenous Once  . antiseptic oral rinse  7 mL Mouth Rinse BID  . atorvastatin  80 mg Oral QHS  . budesonide (PULMICORT) nebulizer solution  0.5 mg Nebulization BID  . feeding supplement (GLUCERNA SHAKE)  237 mL Oral BID PC   . feeding supplement (PRO-STAT SUGAR FREE 64)  30 mL Oral Daily  . furosemide  40 mg Intravenous Q12H  . gabapentin  300 mg Oral QHS  . guaiFENesin  600 mg Oral BID  . insulin aspart  0-9 Units Subcutaneous TID WC  . insulin glargine  10 Units Subcutaneous Daily  . ipratropium-albuterol  3 mL Nebulization QID  . latanoprost  1 drop Right Eye QHS  . methylPREDNISolone (SOLU-MEDROL) injection  60 mg Intravenous Q12H  . metoprolol tartrate  12.5 mg Oral BID  . multivitamin with minerals  1 tablet Oral Daily  . nitroGLYCERIN  0.5 inch Topical 3 times per day  . pantoprazole  40 mg Oral BID  . ranolazine  500 mg Oral BID   Continuous:   UXN:ATFTDDUKG, nitroGLYCERIN, polyvinyl alcohol  Assessment/Plan:  Principal Problem:   Sepsis ([place]) Active Problems:   CAD (coronary artery disease)   COPD (chronic obstructive pulmonary disease) (HCC)   Anemia   Diabetes mellitus (HCC)   Diastolic CHF (HCC)   COPD exacerbation (HCC)   Acute on chronic respiratory failure (HCC)   DVT (deep venous thrombosis) (HCC)   AKI (acute kidney injury) ([place])   History of stroke   GERD (gastroesophageal reflux disease)   Malnutrition of moderate degree   Heme positive stool   Absolute anemia    Community-acquired pneumonia, failing outpatient treatment Improved. Blood cultures are negative. Change to Levaquin today.   Fluid overload with acute respiratory failure Likely secondary to blood transfusion. Patient was placed on BiPAP yesterday. He was given Lasix. He's feeling much better this morning. Continue intravenous Lasix for now. Should be able to come off of BiPAP this morning.  Normocytic anemia Hemoglobin was noted to be trending down. No overt bleeding noted. Recently he was found to have positive FOBT at his SNF. Apparently, plan was for referral to gastroenterology, but it hasn't occurred yet. Patient reports that he's had a colonoscopy and upper endoscopy but many years ago.  Denies any  black stools. Appreciate GI input. Plan is for EGD. Once patient's respiratory status stable. Continue PPI. He was transfused 2 units of blood 11/2. Hemoglobin has improved.  Prior history of coronary artery disease Apparently not amenable to intervention. Patient had been experiencing chest pain. Etiology for this pain is not entirely clear. Troponins were not significantly elevated. This was likely angina secondary to anemia. Continue Nitroglycerin paste for now. Echocardiogram as above. EKG did not show any ischemic changes.  History of severe aortic stenosis Stable. Echocardiogram as above.  History of COPD with probable mild exacerbation Seems to be improving. Continue nebulizer treatments. Start tapering steroids.   History of peripheral vascular disease Holding his antiplatelet agent due to dropping hemoglobin. Continue to monitor closely.  History of essential hypertension Blood pressure was low at the time of admission. Antihypertensives on hold except for metoprolol. Continue to monitor. Currently blood pressure is stable.  History of diastolic congestive heart failure See above. Continue to monitor.  History of type 2 diabetes with hyperglycemia High blood sugars is most secondary to steroids. Patient was on metformin at home. HbA1c is 7.2. Continue Lantus for now. EBG should improve as steroid is tapered down.  Superficial Vein Thrombosis Doppler revealed acute superficial vein thrombosis involving the right cephalic vein. Symptomatic treatment.  DVT Prophylaxis: Change to SCDs    Code Status: DO NOT RESUSCITATE  Family Communication: Discussed with his daughter, Jeannene Patella  Disposition Plan: Continue to remain in step down unit for now. PT/OT to continue to evaluate.     LOS: 3 days   [facility] Pager [phone] 07/03/2015, 7:19 AM  If 7PM-7AM, please contact night-coverage at [email or web address], password [facility]

## 2015-07-03 NOTE — Progress Notes (Signed)
I spoke with the patient's daughter, Wilmon Pali, by phone to discuss upper endoscopy results Mild gastritis but no overt bleeding source Patient has not had overt bleeding recently. She did confirm when radiation proctitis was a problem he was having overt rectal bleeding We discussed the possibility of colonoscopy, which would be higher risk for him After our discussion the decision was made for observation only and not to pursue colonoscopy at this time. His daughter fully agreed. Monitor hemoglobin, consider colonoscopy for overt bleeding GI available, call with questions

## 2015-07-03 NOTE — Anesthesia Procedure Notes (Signed)
Procedure Name: MAC Date/Time: 07/03/2015 11:00 AM Performed by: Eligha Bridegroom Pre-anesthesia Checklist: Patient identified, Emergency Drugs available, Suction available, Patient being monitored and Timeout performed Patient Re-evaluated:Patient Re-evaluated prior to inductionOxygen Delivery Method: Nasal cannula Preoxygenation: Pre-oxygenation with 100% oxygen Intubation Type: IV induction

## 2015-07-03 NOTE — Progress Notes (Signed)
PT note Pt continues to need min guard assist to supervision with ambulation with rollator.  Pt lives in I living facility.  Daughter talked with SW today about pt getting therapy at SNF prior to d/c home. Daughter worried that pt is not strong enough to go home.  Pt was so adamant over last two days that this PT has seen him that he wanted to go get [facility] therapy and stated he could use the wheelchair at home if he had to.  However, after daughter talked with pt. He is agreeable to go to SNF with therapy.  This PT feels that if he does this he will gain endurance and greater independence for better function in I living.  Will continue therapy here at hospital until pt goes to SNF.  Thanks.   [place] [phone] (pager)

## 2015-07-03 NOTE — NC FL2 (Signed)
[place] LEVEL OF CARE SCREENING TOOL     IDENTIFICATION  Patient Name: Eugene Bauer Birthdate: 1927-04-26 Sex: male Admission Date (Current Location): 06/30/2015  [facility] and [place] Number: Herbalist and Address:  [facility] [employer], [place] [address] [place], [place] [address]      Provider Number: [id number]  Attending Physician Name and Address:  Bonnielee Haff, MD  Relative Name and Phone Number:  Pam    Current Level of Care: Hospital Recommended Level of Care: [place] Prior Approval Number:    Date Approved/Denied:   PASRR Number:    Discharge Plan: SNF    Current Diagnoses: Patient Active Problem List   Diagnosis Date Noted  . History of peptic ulcer disease   . Heme positive stool   . Absolute anemia   . Malnutrition of moderate degree 07/01/2015  . Sepsis ([place]) 06/30/2015  . AKI (acute kidney injury) ([place]) 06/30/2015  . History of stroke 06/30/2015  . GERD (gastroesophageal reflux disease) 06/30/2015  . Elevated troponin 06/08/2013  . HCAP (healthcare-associated pneumonia) 06/08/2013  . DVT (deep venous thrombosis) ([place]) 05/20/2013  . Diastolic CHF ([place]) 94/85/4627  . COPD exacerbation ([place]) 05/18/2013  . Syncope and collapse 05/18/2013  . Acute on chronic respiratory failure ([place]) 05/18/2013  . CAD (coronary artery disease) 01/02/2013  . COPD (chronic obstructive pulmonary disease) ([place]) 01/02/2013  . Hyponatremia 01/02/2013  . Anemia 01/02/2013  . Diabetes mellitus ([place]) 01/02/2013  . Hyperlipidemia 01/02/2013    Orientation ACTIVITIES/SOCIAL BLADDER RESPIRATION    Self, Time, Situation, Place    Indwelling catheter O2 (As needed) (2L)  BEHAVIORAL SYMPTOMS/MOOD NEUROLOGICAL BOWEL NUTRITION STATUS      Continent Diet (clear liquid)  PHYSICIAN VISITS COMMUNICATION OF NEEDS Height & Weight Skin    Verbally 5\' 8"  (172.7 cm) 168 lbs. Normal          AMBULATORY STATUS RESPIRATION     Assist extensive O2 (As needed) (2L)      Personal Care Assistance Level of Assistance  Bathing, Dressing Bathing Assistance: Limited assistance   Dressing Assistance: Limited assistance      Functional Limitations [place]  PT (By licensed PT)     PT Frequency: 5/wk             Additional Factors Info  Insulin Sliding Scale, Code Status Code Status Info: DNR     Insulin Sliding Scale Info: 3x       Current Medications (07/03/2015): Current Facility-Administered Medications  Medication Dose Route Frequency Provider Last Rate Last Dose  . 0.9 %  sodium chloride infusion   Intravenous Continuous Lori P Hvozdovic, PA-C      . albuterol (PROVENTIL) (2.5 MG/3ML) 0.083% nebulizer solution 2.5 mg  2.5 mg Nebulization Q2H PRN Ivor Costa, MD   2.5 mg at 07/02/15 0608  . antiseptic oral rinse (CPC / CETYLPYRIDINIUM CHLORIDE 0.05%) solution 7 mL  7 mL Mouth Rinse BID Nita Sells, MD   7 mL at 07/02/15 2200  . atorvastatin (LIPITOR) tablet 80 mg  80 mg Oral QHS Ivor Costa, MD   80 mg at 07/02/15 2230  . budesonide (PULMICORT) nebulizer solution 0.5 mg  0.5 mg Nebulization BID Ivor Costa, MD   0.5 mg at 07/03/15 0350  . feeding supplement (GLUCERNA SHAKE) (GLUCERNA SHAKE) liquid 237 mL  237 mL Oral BID PC Reanne J Barbato,  RD   237 mL at 07/01/15 1703  . feeding supplement (PRO-STAT SUGAR FREE 64) liquid 30 mL  30 mL Oral Daily Reanne J Barbato, RD   30 mL at 07/01/15 1659  . furosemide (LASIX) injection 40 mg  40 mg Intravenous Q12H Bonnielee Haff, MD      . gabapentin (NEURONTIN) capsule 300 mg  300 mg Oral QHS Ivor Costa, MD   300 mg at 07/02/15 2231  . guaiFENesin (MUCINEX) 12 hr tablet 600 mg  600 mg Oral BID Bonnielee Haff, MD   600 mg at 07/03/15 1300  . insulin aspart (novoLOG) injection 0-9 Units  0-9 Units Subcutaneous TID WC Ivor Costa, MD   3 Units at 07/03/15 1301  . insulin glargine (LANTUS) injection 10 Units  10 Units  Subcutaneous Daily Bonnielee Haff, MD   10 Units at 07/03/15 1000  . ipratropium-albuterol (DUONEB) 0.5-2.5 (3) MG/3ML nebulizer solution 3 mL  3 mL Nebulization QID Nita Sells, MD   3 mL at 07/03/15 1235  . latanoprost (XALATAN) 0.005 % ophthalmic solution 1 drop  1 drop Right Eye QHS Ivor Costa, MD   1 drop at 07/02/15 2230  . levofloxacin (LEVAQUIN) IVPB 750 mg  750 mg Intravenous Q48H Franky Macho, RPH   750 mg at 07/03/15 1302  . [START ON 07/04/2015] methylPREDNISolone sodium succinate (SOLU-MEDROL) 125 mg/2 mL injection 60 mg  60 mg Intravenous Daily Bonnielee Haff, MD      . metoprolol tartrate (LOPRESSOR) tablet 12.5 mg  12.5 mg Oral BID Ivor Costa, MD   12.5 mg at 07/03/15 1259  . multivitamin with minerals tablet 1 tablet  1 tablet Oral Daily Lorenda Peck, RD   1 tablet at 07/03/15 1258  . nitroGLYCERIN (NITROGLYN) 2 % ointment 0.5 inch  0.5 inch Topical 3 times per day Bonnielee Haff, MD   0.5 inch at 07/03/15 0542  . nitroGLYCERIN (NITROSTAT) SL tablet 0.4 mg  0.4 mg Sublingual Q5 min PRN Ivor Costa, MD   0.4 mg at 07/02/15 0741  . pantoprazole (PROTONIX) EC tablet 40 mg  40 mg Oral BID Bonnielee Haff, MD   40 mg at 07/03/15 1302  . polyvinyl alcohol (LIQUIFILM TEARS) 1.4 % ophthalmic solution 1 drop  1 drop Both Eyes PRN Ivor Costa, MD      . ranolazine (RANEXA) 12 hr tablet 500 mg  500 mg Oral BID Ivor Costa, MD   500 mg at 07/03/15 1300   Do not use this list as official medication orders. Please verify with discharge summary.  Discharge Medications:   Medication List    ASK your doctor about these medications        acetaminophen 500 MG tablet  Commonly known as:  TYLENOL  Take 500 mg by mouth every 6 (six) hours as needed for pain.     albuterol (5 MG/ML) 0.5% nebulizer solution  Commonly known as:  PROVENTIL  Take 0.5 mLs (2.5 mg total) by nebulization every 6 (six) hours as needed for wheezing or shortness of breath.     albuterol 108 (90 BASE) MCG/ACT inhaler   Commonly known as:  PROVENTIL HFA;VENTOLIN HFA  Inhale 2 puffs into the lungs every 6 (six) hours as needed for wheezing.     aspirin 81 MG tablet  Take 81 mg by mouth daily.     atorvastatin 80 MG tablet  Commonly known as:  LIPITOR  Take 80 mg by mouth at bedtime.     gabapentin 300 MG capsule  Commonly known as:  NEURONTIN  Take 300 mg by mouth at bedtime.     ipratropium 0.02 % nebulizer solution  Commonly known as:  ATROVENT  Take 2.5 mLs (0.5 mg total) by nebulization every 6 (six) hours as needed.     metFORMIN 500 MG tablet  Commonly known as:  GLUCOPHAGE  Take 500 mg by mouth 2 (two) times daily with a meal.     metoprolol tartrate 25 MG tablet  Commonly known as:  LOPRESSOR  Take 12.5 mg by mouth 2 (two) times daily.     mometasone 220 MCG/INH inhaler  Commonly known as:  ASMANEX  Inhale 2 puffs into the lungs at bedtime.     nitroGLYCERIN 0.4 MG SL tablet  Commonly known as:  NITROSTAT  Place 0.4 mg under the tongue every 5 (five) minutes as needed for chest pain.     Olodaterol HCl 2.5 MCG/ACT Aers  Inhale 2.5 mcg into the lungs daily.     omeprazole 40 MG capsule  Commonly known as:  PRILOSEC  Take 1 capsule (40 mg total) by mouth 2 (two) times daily before a meal.     polyvinyl alcohol 1.4 % ophthalmic solution  Commonly known as:  LIQUIFILM TEARS  Place 1 drop into both eyes as needed.     ranolazine 500 MG 12 hr tablet  Commonly known as:  RANEXA  Take 500 mg by mouth 2 (two) times daily.     Travoprost (BAK Free) 0.004 % Soln ophthalmic solution  Commonly known as:  TRAVATAN  Place 1 drop into both eyes at bedtime.        Relevant Imaging Results:  Relevant Lab Results:  Recent Labs    Additional Information    Cranford Mon, LCSW

## 2015-07-03 NOTE — Op Note (Signed)
[facility] [place], [id number]   ENDOSCOPY PROCEDURE REPORT  PATIENT: Eugene Bauer, Eugene Bauer  MR#: [id number] BIRTHDATE: 1927-05-11 , 56  yrs. old GENDER: male ENDOSCOPIST: Jerene Bears, MD REFERRED BY:  [facility] PROCEDURE DATE:  07/03/2015 PROCEDURE:  EGD, diagnostic ASA CLASS:     Class III INDICATIONS:  anemia, heme + stools, history of PUD. MEDICATIONS: Monitored anesthesia care and Per Anesthesia TOPICAL ANESTHETIC: Cetacaine Spray  DESCRIPTION OF PROCEDURE: After the risks benefits and alternatives of the procedure were thoroughly explained, informed consent was obtained.  The Pentax Gastroscope [id number] endoscope was introduced through the mouth and advanced to the third portion of the duodenum , Without limitations.  The instrument was slowly withdrawn as the mucosa was fully examined.   ESOPHAGUS: The mucosa of the esophagus appeared normal.   Z-line unremarkable at 44 cm.  STOMACH: Mild gastritis (inflammation) with scan adherent hematin was found scattered in the entire examined stomach.  No significant erosions and no ulcers found.  DUODENUM: The duodenal mucosa showed no abnormalities in the bulb, 2nd part of the duodenum and examined portions of the 3rd part duodenum. Retroflexed views revealed no abnormalities.     The scope was then withdrawn from the patient and the procedure completed.  COMPLICATIONS: There were no immediate complications.  ENDOSCOPIC IMPRESSION: 1.   The mucosa of the esophagus appeared normal 2.   Mild gastritis (inflammation) was found in the entire examined stomach 3.   The duodenal mucosa showed no abnormalities in the bulb, 2nd part, and examined portions of D3  RECOMMENDATIONS: 1.  Daily PPI 2.  Monitor Hgb closely 3.  If overt bleeding and patient willing could consider colonoscopy   eSigned:  Jerene Bears, MD 07/03/2015 11:27 AM  CC: the patient

## 2015-07-03 NOTE — Progress Notes (Signed)
   07/03/15 1500  PT - Assessment/Plan  PT Plan Discharge plan needs to be updated  PT Frequency (ACUTE ONLY) Min 2X/week  Follow Up Recommendations SNF;Supervision/Assistance - 24 hour  PT equipment None recommended by PT  [facility] Acute Rehabilitation [phone] (pager)

## 2015-07-03 NOTE — Progress Notes (Signed)
Daily Rounding Note  07/03/2015, 8:45 AM  LOS: 3 days   SUBJECTIVE:       Breathing better after overnight bipap.  It is off now.    OBJECTIVE:         Vital signs in last 24 hours:    Temp:  [97.3 F (36.3 C)-98.2 F (36.8 C)] 97.7 F (36.5 C) (11/03 0758) Pulse Rate:  [67-131] 84 (11/03 0758) Resp:  [13-29] 18 (11/03 0758) BP: (97-191)/(56-103) 109/60 mmHg (11/03 0758) SpO2:  [90 %-100 %] 96 % (11/03 0758) FiO2 (%):  [45 %-56 %] 56 % (11/03 0722) Last BM Date: 07/01/15 Filed Weights   07/01/15 0035  Weight: 168 lb 6.9 oz (76.4 kg)   General: frail, comfortable.    Heart: RRR, not tachy.  2 to 3/6 harsh SM.  Chest: exp wheezes, a few crackles.  Overall BS fair, able to hear movement in all 4 fields. No resp distress or IWOB.    Abdomen: soft, NT, ND.  Active BS  Extremities: no CCE Neuro/Psych:  Pleasant, alert, oriented x 3, cognition well intact.  Tremor in UE, moves all 4 limbs.   Intake/Output from previous day: 11/02 0701 - 11/03 0700 In: 685 [Blood:335; IV Piggyback:350] Out: 700 [Urine:700]  Intake/Output this shift:    Lab Results:  Recent Labs  07/01/15 1240 07/02/15 0227 07/02/15 1514 07/03/15 0434  WBC 6.7 7.4  --  8.2  HGB 7.1* 6.3* 11.4* 10.0*  HCT 21.1* 19.2* 33.9* 29.0*  PLT 183 206  --  208   BMET  Recent Labs  07/01/15 1240 07/02/15 0227 07/03/15 0434  NA 134* 132* 134*  K 4.6 4.3 4.0  CL 100* 100* 98*  CO2 29 27 26   GLUCOSE 256* 218* 251*  BUN 16 19 16   CREATININE 1.10 1.16 1.19  CALCIUM 8.6* 8.2* 8.6*   LFT  Recent Labs  07/01/15 1240  PROT 5.1*  ALBUMIN 2.1*  AST 17  ALT 14*  ALKPHOS 60  BILITOT 0.5   PT/INR  Recent Labs  06/30/15 2231  LABPROT 14.3  INR 1.09   Hepatitis Panel No results for input(s): HEPBSAG, HCVAB, HEPAIGM, HEPBIGM in the last 72 hours.  Studies/Results: Dg Chest Port 1 View  07/02/2015  CLINICAL DATA:  81 year old male with  history of dyspnea. EXAM: PORTABLE CHEST 1 VIEW COMPARISON:  Chest x-ray 06/30/2015. FINDINGS: There is marked cephalization of the pulmonary vasculature, severe indistinctness of the interstitial markings, and extensive multifocal airspace disease throughout the lungs bilaterally suggestive of severe pulmonary edema. Small bilateral pleural effusions. Mild cardiomegaly. Upper mediastinal contours are within normal limits. Atherosclerosis in the thoracic aorta. IMPRESSION: 1. Findings, as above, suggestive of congestive heart failure. 2. Atherosclerosis. Electronically Signed   By: Vinnie Langton M.D.   On: 07/02/2015 16:24   Scheduled Meds: . sodium chloride   Intravenous Once  . antiseptic oral rinse  7 mL Mouth Rinse BID  . atorvastatin  80 mg Oral QHS  . budesonide (PULMICORT) nebulizer solution  0.5 mg Nebulization BID  . feeding supplement (GLUCERNA SHAKE)  237 mL Oral BID PC  . feeding supplement (PRO-STAT SUGAR FREE 64)  30 mL Oral Daily  . furosemide  40 mg Intravenous Q12H  . gabapentin  300 mg Oral QHS  . guaiFENesin  600 mg Oral BID  . insulin aspart  0-9 Units Subcutaneous TID WC  . insulin glargine  10 Units Subcutaneous Daily  . ipratropium-albuterol  3 mL Nebulization QID  . latanoprost  1 drop Right Eye QHS  . levofloxacin (LEVAQUIN) IV  750 mg Intravenous Q48H  . methylPREDNISolone (SOLU-MEDROL) injection  60 mg Intravenous Q12H  . metoprolol tartrate  12.5 mg Oral BID  . multivitamin with minerals  1 tablet Oral Daily  . nitroGLYCERIN  0.5 inch Topical 3 times per day  . pantoprazole  40 mg Oral BID  . ranolazine  500 mg Oral BID   Continuous Infusions: . sodium chloride     PRN Meds:.albuterol, nitroGLYCERIN, polyvinyl alcohol  ASSESMENT:   *    Anemia.  FOBT +.  Hgb improved after 2 PRBCs.    *  Hx PUD.  Early satiety, anorexia, weight loss, regurgitation. On bid oral PPI as he is at home.   *  CHF.  Breathing improved on bipap overnight.    *  O2 dependent  COPD.   *  Aortic stenosis.    PLAN   *  EGD today with MAC.      Azucena Freed  07/03/2015, 8:45 AM Pager: [phone]

## 2015-07-03 NOTE — Progress Notes (Signed)
Inpatient Diabetes Program Recommendations  AACE/ADA: New Consensus Statement on Inpatient Glycemic Control (2015)  Target Ranges:  Prepandial:   less than 140 mg/dL      Peak postprandial:   less than 180 mg/dL (1-2 hours)      Critically ill patients:  140 - 180 mg/dL    Results for DONNAVIN, VANDENBRINK (MRN [id number]) as of 07/03/2015 13:19  Ref. Range 07/02/2015 08:22 07/02/2015 12:20 07/02/2015 16:54 07/02/2015 21:25  Glucose-Capillary Latest Ref Range: 65-99 mg/dL 253 (H) 253 (H) 228 (H) 196 (H)   Results for ANIELLO, CHRISTOPOULOS (MRN [id number]) as of 07/03/2015 13:19  Ref. Range 07/03/2015 07:57 07/03/2015 12:13  Glucose-Capillary Latest Ref Range: 65-99 mg/dL 212 (H) 203 (H)    Home DM Meds: Metformin 500 mg bid  Current Insulin Orders: Novolog Sensitive SSI (0-9 units) TID AC      Lantus 10 units daily    -Patient currently receiving IV Solumedrol 60 mg daily.  -Glucose levels consistently elevated >200 mg/dl.    MD- Please consider the following in-hospital insulin adjustments while patient receiving IV steroids:  1. Increase Lantus to 12 units daily (20% increase)  2. Increase Novolog SSI to Moderate scale (0-15 units) TID AC + HS    --Will follow patient during hospitalization--  Wyn Quaker RN, MSN, CDE Diabetes Coordinator Inpatient Glycemic Control Team Team Pager: [phone] (8a-5p)

## 2015-07-03 NOTE — Clinical Social Work Note (Signed)
Clinical Social Work Assessment  Patient Details  Name: Eugene Bauer MRN: [id number] Date of Birth: [birth year]  Date of referral:  07/03/15               Reason for consult:  Facility Placement                Permission sought to share information with:  Family Supports Permission granted to share information::  Yes, Verbal Permission Granted  Name::     Pam  Agency::  [employer]  Relationship::  daughter  Sport and exercise psychologist Information:     Housing/Transportation Living arrangements for the past 2 months:  [place], Charity fundraiser of Information:  Patient, Adult Children Patient Interpreter Needed:  None Criminal Activity/Legal Involvement Pertinent to Current Situation/Hospitalization:  No - Comment as needed Significant Relationships:  Adult Children Lives with:  Facility Resident Do you feel safe going back to the place where you live?  No Need for family participation in patient care:  No (Coment)  Care giving concerns: pt lives in independent living and was there for several days after DC from SNF- he was not able to ambulate well at [facility] and doesn't feel as if he would be successful returning there at time of DC   Facilities manager / plan:  CSW spoke with pt and pt daughter concerning plan for time DC- return to Independent Living vs SNF  Employment status:  Retired Forensic scientist:  Medicare PT Recommendations:  [place] / Referral to community resources:  [place]  Patient/Family's Response to care:  Pt and dtr are agreeable to return to SNF to increase mobility and manage current medical needs.  They express preference for [facility] SNF.   Patient/Family's Understanding of and Emotional Response to Diagnosis, Current Treatment, and Prognosis:  Pt seems to understand his condition and need for continuing rehab- no questions or concerns at this time  Emotional  Assessment Appearance:  Appears stated age Attitude/Demeanor/Rapport:    Affect (typically observed):  Appropriate Orientation:  Oriented to Situation, Oriented to  Time, Oriented to Place, Oriented to Self Alcohol / Substance use:  Not Applicable Psych involvement (Current and /or in the community):  No (Comment)  Discharge Needs  Concerns to be addressed:  Home Safety Concerns, Care Coordination Readmission within the last 30 days:  No Current discharge risk:  Physical Impairment Barriers to Discharge:  Continued Medical Work up   [employer], LCSW 07/03/2015, 3:15 PM

## 2015-07-03 NOTE — Transfer of Care (Signed)
Immediate Anesthesia Transfer of Care Note  Patient: Eugene Bauer  Procedure(s) Performed: Procedure(s): ESOPHAGOGASTRODUODENOSCOPY (EGD) WITH PROPOFOL (N/A)  Patient Location: PACU and Endoscopy Unit  Anesthesia Type:MAC  Level of Consciousness: awake, alert  and oriented  Airway & Oxygen Therapy: Patient Spontanous Breathing and Patient connected to nasal cannula oxygen  Post-op Assessment: Report given to RN and Post -op Vital signs reviewed and stable  Post vital signs: Reviewed and stable  Last Vitals:  Filed Vitals:   07/03/15 1150  BP:   Pulse: 85  Temp:   Resp: 16    Complications: No apparent anesthesia complications

## 2015-07-03 NOTE — Care Management Important Message (Signed)
Important Message  Patient Details  Name: Eugene Bauer MRN: [id number] Date of Birth: [birth year]   Medicare Important Message Given:  Yes-second notification given    Nathen May 07/03/2015, 3:37 PM

## 2015-07-03 NOTE — Progress Notes (Signed)
Up to use BSC x 1 asst

## 2015-07-03 NOTE — Progress Notes (Signed)
ANTIBIOTIC CONSULT NOTE - INITIAL  Pharmacy Consult for Levaquin Indication: CAP  Allergies  Allergen Reactions  . Iodine Shortness Of Breath  . Iohexol      Code: SOB, Desc: ivp dye, iodine, Onset Date: [id number]     Patient Measurements: Height: 5\' 8"  (172.7 cm) Weight: 168 lb 6.9 oz (76.4 kg) IBW/kg (Calculated) : 68.4  Vital Signs: Temp: 97.7 F (36.5 C) (11/03 0337) Temp Source: Axillary (11/03 0337) BP: 113/59 mmHg (11/03 0500) Pulse Rate: 73 (11/03 0500) Intake/Output from previous day: 11/02 0701 - 11/03 0700 In: 685 [Blood:335; IV Piggyback:350] Out: 700 [Urine:700] Intake/Output from this shift:    Labs:  Recent Labs  06/30/15 2247 07/01/15 1240 07/02/15 0227 07/02/15 1514 07/03/15 0434  WBC  --  6.7 7.4  --  8.2  HGB  --  7.1* 6.3* 11.4* 10.0*  PLT  --  183 206  --  208  LABCREA 101.86  --   --   --   --   CREATININE  --  1.10 1.16  --  1.19   Estimated Creatinine Clearance: 43.9 mL/min (by C-G formula based on Cr of 1.19). No results for input(s): VANCOTROUGH, VANCOPEAK, VANCORANDOM, GENTTROUGH, GENTPEAK, GENTRANDOM, TOBRATROUGH, TOBRAPEAK, TOBRARND, AMIKACINPEAK, AMIKACINTROU, AMIKACIN in the last 72 hours.   Microbiology: Recent Results (from the past 720 hour(s))  Culture, blood (routine x 2)     Status: None (Preliminary result)   Collection Time: 06/30/15  6:31 PM  Result Value Ref Range Status   Specimen Description BLOOD LEFT ARM  Final   Special Requests BOTTLES DRAWN AEROBIC AND ANAEROBIC 3.5CC  Final   Culture NO GROWTH 2 DAYS  Final   Report Status PENDING  Incomplete  Culture, blood (routine x 2)     Status: None (Preliminary result)   Collection Time: 06/30/15  8:16 PM  Result Value Ref Range Status   Specimen Description BLOOD LEFT HAND  Final   Special Requests BOTTLES DRAWN AEROBIC AND ANAEROBIC 5CC  Final   Culture NO GROWTH 2 DAYS  Final   Report Status PENDING  Incomplete  Urine culture     Status: None   Collection Time:  06/30/15  8:21 PM  Result Value Ref Range Status   Specimen Description URINE, CLEAN CATCH  Final   Special Requests Normal  Final   Culture MULTIPLE SPECIES PRESENT, SUGGEST RECOLLECTION  Final   Report Status 07/01/2015 FINAL  Final  MRSA PCR Screening     Status: None   Collection Time: 07/01/15  1:23 AM  Result Value Ref Range Status   MRSA by PCR NEGATIVE NEGATIVE Final    Comment:        The GeneXpert MRSA Assay (FDA approved for NASAL specimens only), is one component of a comprehensive MRSA colonization surveillance program. It is not intended to diagnose MRSA infection nor to guide or monitor treatment for MRSA infections.     Medical History: Past Medical History  Diagnosis Date  . Aortic stenosis   . High cholesterol   . Retinal artery occlusion   . GERD (gastroesophageal reflux disease)   . Coronary artery disease   . Heart murmur   . On home oxygen therapy     "2L all the time" (01/02/2013)  . Angina   . Hypertension   . CHF (congestive heart failure) ([place])   . MI (myocardial infarction) ([place])     "I've had several; last one was 03/09/2012" (01/02/2013  . Asthma   . Pneumonia     "  today; have had it at least twice before" (01/02/2013)  . Shortness of breath     "most of the time recently" (01/02/2013)  . Chronic bronchitis ([place])   . History of stomach ulcers     "have them now; they flare up when I eat the wrong thing or don't eat" (01/02/2013)  . Hepatitis     "don't know which one" (01/02/2013)  . DJMEQAST(419.6)     "probably weekly" (01/02/2013)  . Arthritis     "all over" (01/02/2013)  . DJD (degenerative joint disease)     "all over" (01/02/2013)  . Chronic lower back pain   . Urinary urgency   . Anemia     chronic/notes 01/02/2013  . Retinal embolus, right eye 2010  . Stroke [facility])     "several; left ankle/foot still weak from one of them" (01/02/2013), 2006  . COPD (chronic obstructive pulmonary disease) ([place])   . Emphysema   . Type II diabetes mellitus  ([place])     Controlled by Metformin  . Prostate cancer [facility])     "had radiation treatments" (01/02/2013)  . Basal cell carcinoma     "left nose, behind right knee" (01/02/2013)   Assessment: 81 y.o. male known to pharmacy from abx dosing. Changing from Vanc/Zosyn to Levaquin (today is Day #4 of abx). SCr 1.19, est CrCl 44 ml/min. WBC wnl.  Goal of Therapy:  Resolution of infection  Plan:  Levaquin 750mg  IV q48h Will f/u renal function, micro data, and pt's clinical condition F/u LOT (usually 5 days total abx)  Sherlon Handing, PharmD, BCPS Clinical pharmacist, pager [phone] 07/03/2015,7:21 AM

## 2015-07-03 NOTE — Anesthesia Preprocedure Evaluation (Addendum)
Anesthesia Evaluation  Patient identified by MRN, date of birth, ID band Patient awake    Reviewed: Allergy & Precautions, NPO status , Patient's Chart, lab work & pertinent test results, reviewed documented beta blocker date and time   Airway Mallampati: II  TM Distance: >3 FB Neck ROM: Full    Dental   Pulmonary asthma , COPD,  COPD inhaler and oxygen dependent,    breath sounds clear to auscultation       Cardiovascular hypertension, Pt. on medications and Pt. on home beta blockers + CAD and + Past MI  + Valvular Problems/Murmurs (Mod to severe AS. EF 50-55%) AS  Rhythm:Regular Rate:Normal     Neuro/Psych CVA    GI/Hepatic GERD  ,(+) Hepatitis -  Endo/Other  diabetes, Type 2, Oral Hypoglycemic Agents  Renal/GU Renal InsufficiencyRenal disease     Musculoskeletal  (+) Arthritis ,   Abdominal   Peds  Hematology  (+) anemia ,   Anesthesia Other Findings   Reproductive/Obstetrics                           Anesthesia Physical Anesthesia Plan  ASA: III  Anesthesia Plan: MAC   Post-op Pain Management:    Induction: Intravenous  Airway Management Planned: Natural Airway and Nasal Cannula  Additional Equipment:   Intra-op Plan:   Post-operative Plan:   Informed Consent: I have reviewed the patients History and Physical, chart, labs and discussed the procedure including the risks, benefits and alternatives for the proposed anesthesia with the patient or authorized representative who has indicated his/her understanding and acceptance.     Plan Discussed with: CRNA  Anesthesia Plan Comments:         Anesthesia Quick Evaluation

## 2015-07-03 NOTE — Progress Notes (Signed)
Patient is resting comfortably on 3L nasal cannula with no complaints of SOA and no apparent signs of respiratory distress. Bipap at bedside, but not placed on at this time.  RT will continue to monitor.

## 2015-07-03 NOTE — Progress Notes (Signed)
CSW spoke with pt dtr who is concerned about pt return to independent living when medically stable for PT- dtr states pt is not at baseline functioning and was not doing well at independent living facility because of deficits and respiratory issues.  Dtr would like pt placed back at [facility] if possible  CSW paged PT to discuss- PT says pt was not agreeable to SNF when idea of rehab was discussed.  CSW will continue to follow and assess  Domenica Reamer, [place] Social Worker [phone]

## 2015-07-04 ENCOUNTER — Encounter (HOSPITAL_COMMUNITY): Payer: Self-pay | Admitting: Internal Medicine

## 2015-07-04 DIAGNOSIS — I5033 Acute on chronic diastolic (congestive) heart failure: Secondary | ICD-10-CM

## 2015-07-04 LAB — GLUCOSE, CAPILLARY
GLUCOSE-CAPILLARY: 134 mg/dL — AB (ref 65–99)
Glucose-Capillary: 247 mg/dL — ABNORMAL HIGH (ref 65–99)
Glucose-Capillary: 311 mg/dL — ABNORMAL HIGH (ref 65–99)
Glucose-Capillary: 268 mg/dL — ABNORMAL HIGH (ref 65–99)

## 2015-07-04 LAB — BASIC METABOLIC PANEL
Anion gap: 9 (ref 5–15)
BUN: 17 mg/dL (ref 6–20)
CALCIUM: 8.5 mg/dL — AB (ref 8.9–10.3)
CO2: 30 mmol/L (ref 22–32)
CREATININE: 1.13 mg/dL (ref 0.61–1.24)
Chloride: 96 mmol/L — ABNORMAL LOW (ref 101–111)
GFR calc non Af Amer: 57 mL/min — ABNORMAL LOW (ref >60)
GLUCOSE: 166 mg/dL — AB (ref 65–99)
Potassium: 3.5 mmol/L (ref 3.5–5.1)
Sodium: 135 mmol/L (ref 135–145)

## 2015-07-04 LAB — CBC
HCT: 30.8 % — ABNORMAL LOW (ref 39.0–52.0)
HEMATOCRIT: 25.9 % — AB (ref 39.0–52.0)
Hemoglobin: 8.8 g/dL — ABNORMAL LOW (ref 13.0–17.0)
Hemoglobin: 10.3 g/dL — ABNORMAL LOW (ref 13.0–17.0)
MCH: 28.8 pg (ref 26.0–34.0)
MCH: 28.3 pg (ref 26.0–34.0)
MCHC: 34 g/dL (ref 30.0–36.0)
MCHC: 33.4 g/dL (ref 30.0–36.0)
MCV: 84.6 fL (ref 78.0–100.0)
MCV: 84.6 fL (ref 78.0–100.0)
PLATELETS: 246 10*3/uL (ref 150–400)
Platelets: 209 10*3/uL (ref 150–400)
RBC: 3.06 MIL/uL — ABNORMAL LOW (ref 4.22–5.81)
RBC: 3.64 MIL/uL — ABNORMAL LOW (ref 4.22–5.81)
RDW: 16.1 % — AB (ref 11.5–15.5)
RDW: 15.9 % — AB (ref 11.5–15.5)
WBC: 8.8 10*3/uL (ref 4.0–10.5)
WBC: 11.1 10*3/uL — ABNORMAL HIGH (ref 4.0–10.5)

## 2015-07-04 MED ORDER — METHYLPREDNISOLONE SODIUM SUCC 40 MG IJ SOLR
40.0000 mg | Freq: Every day | INTRAMUSCULAR | Status: DC
Start: 2015-07-05 — End: 2015-07-05

## 2015-07-04 NOTE — Progress Notes (Signed)
Pt refuses BiPAP, stated that he wants to wear the nasal pillows and dept doesn't carry nasal pillows only the mask.

## 2015-07-04 NOTE — Progress Notes (Signed)
[facility] will continue to follow and are able to accept pt when medically stable- [facility] can accept weekend DCs if pt is ready over weekend  CSW will continue to follow  Domenica Reamer, [place] Social Worker [phone]

## 2015-07-04 NOTE — Progress Notes (Signed)
[facility] PROGRESS NOTE  Eugene Bauer RWE:[id number] DOB: 02/06/1927 DOA: 06/30/2015  [facility]  Brief HPI: 81 year old Caucasian male with a past medical history of moderate to severe COPD on home oxygen, history of aortic stenosis, chronic diastolic congestive heart failure, coronary artery disease, peripheral vascular disease was diagnosed with pneumonia on October 16. He was treated with oral antibiotics for same. He also was found to have positive fecal occult blood testing. Patient presented with fever, shortness of breath, chills. Family felt that patient had not responded appropriately to oral antibiotics. He was hospitalized for further management. During this hospitalization, he developed fluid overload treated with Lasix. Required BiPAP. Also noted to have hemoglobin that was trending down. Underwent EGD.  Past medical history:  Past Medical History  Diagnosis Date  . Aortic stenosis   . High cholesterol   . Retinal artery occlusion   . GERD (gastroesophageal reflux disease)   . Coronary artery disease   . Heart murmur   . On home oxygen therapy     "2L all the time" (01/02/2013)  . Angina   . Hypertension   . CHF (congestive heart failure) ([place])   . MI (myocardial infarction) ([place])     "I've had several; last one was 03/09/2012" (01/02/2013  . Asthma   . Pneumonia     "today; have had it at least twice before" (01/02/2013)  . Shortness of breath     "most of the time recently" (01/02/2013)  . Chronic bronchitis ([place])   . History of stomach ulcers     "have them now; they flare up when I eat the wrong thing or don't eat" (01/02/2013)  . Hepatitis     "don't know which one" (01/02/2013)  . YPPJKDTO(671.2)     "probably weekly" (01/02/2013)  . Arthritis     "all over" (01/02/2013)  . DJD (degenerative joint disease)     "all over" (01/02/2013)  . Chronic lower back pain   . Urinary urgency   . Anemia     chronic/notes 01/02/2013  . Retinal embolus, right eye 2010    . Stroke [facility])     "several; left ankle/foot still weak from one of them" (01/02/2013), 2006  . COPD (chronic obstructive pulmonary disease) ([place])   . Emphysema   . Type II diabetes mellitus ([place])     Controlled by Metformin  . Prostate cancer [facility])     "had radiation treatments" (01/02/2013)  . Basal cell carcinoma     "left nose, behind right knee" (01/02/2013)    Consultants: Gastroenterology  Procedures:  ECHO Study Conclusions - Left ventricle: The cavity size was normal. Wall thickness wasincreased in a pattern of mild LVH. There was focal basalhypertrophy. Systolic function was normal. The estimated ejectionfraction was in the range of 50% to 55%. Wall motion was normal;there were no regional wall motion abnormalities. - Aortic valve: There was moderate to severe stenosis. There wastrivial regurgitation. Valve area (VTI): 0.89 cm^2. Valve area(Vmax): 0.86 cm^2. Valve area (Vmean): 0.78 cm^2. - Mitral valve: Mildly to moderately calcified annulus. There wasmild regurgitation. Valve area by continuity equation (using LVOTflow): 2.19 cm^2. - Right atrium: The atrium was mildly dilated. - Tricuspid valve: There was mild-moderate regurgitation. - Pulmonary arteries: Systolic pressure was moderately increased.PA peak pressure: 62 mm Hg (S).  EGD 11/3 ENDOSCOPIC IMPRESSION: 1. The mucosa of the esophagus appeared normal 2. Mild gastritis (inflammation) was found in the entire examined stomach 3. The duodenal mucosa showed no abnormalities in the  bulb, 2nd part, and examined portions of D3  Antibiotics: Vancomycin and Zosyn until 11/3 Levaquin 11/3  Subjective: Patient feels much better this morning. Shortness of breath has significantly improved. Denies any chest pain. Did not require BiPAP for the last 24 hours. Denies any bleeding.  Objective: Vital Signs  Filed Vitals:   07/04/15 0438 07/04/15 0450 07/04/15 0500 07/04/15 0600  BP: 129/61  137/66 148/69  Pulse:  76  80 81  Temp: 98 F (36.7 C)     TempSrc: Axillary     Resp: 14  14 15   Height:      Weight:  78.5 kg (173 lb 1 oz)    SpO2: 99%  98% 98%    Intake/Output Summary (Last 24 hours) at 07/04/15 0737 Last data filed at 07/04/15 0500  Gross per 24 hour  Intake    350 ml  Output   1900 ml  Net  -1550 ml   Filed Weights   07/01/15 0035 07/04/15 0450  Weight: 76.4 kg (168 lb 6.9 oz) 78.5 kg (173 lb 1 oz)    General appearance: alert, cooperative, appears stated age and no distress Resp: Much improved air entry bilaterally. Very few crackles at the bases. No wheezing. No rhonchi.  Cardio: regular rate and rhythm, S1, S2 normal, no murmur, click, rub or gallop GI: soft, non-tender; bowel sounds normal; no masses,  no organomegaly Extremities: Improving pedal edema Neurologic: No focal deficits  Lab Results:  Basic Metabolic Panel:  Recent Labs Lab 06/30/15 1831 06/30/15 2231 07/01/15 1240 07/02/15 0227 07/03/15 0434 07/04/15 0509  NA 133*  --  134* 132* 134* 135  K 4.4  --  4.6 4.3 4.0 3.5  CL 94*  --  100* 100* 98* 96*  CO2 24  --  29 27 26 30   GLUCOSE 162*  --  256* 218* 251* 166*  BUN 16  --  16 19 16 17   CREATININE 1.25* 1.12 1.10 1.16 1.19 1.13  CALCIUM 9.4  --  8.6* 8.2* 8.6* 8.5*   Liver Function Tests:  Recent Labs Lab 07/01/15 1240  AST 17  ALT 14*  ALKPHOS 60  BILITOT 0.5  PROT 5.1*  ALBUMIN 2.1*   CBC:  Recent Labs Lab 06/30/15 1831 07/01/15 1240 07/02/15 0227 07/02/15 1514 07/03/15 0434 07/04/15 0509  WBC 8.8 6.7 7.4  --  8.2 8.8  NEUTROABS  --  6.1 5.8  --   --   --   HGB 8.4* 7.1* 6.3* 11.4* 10.0* 8.8*  HCT 25.9* 21.1* 19.2* 33.9* 29.0* 25.9*  MCV 86.0 85.1 85.0  --  84.1 84.6  PLT 224 183 206  --  208 209   Cardiac Enzymes:  Recent Labs Lab 07/01/15 0111 07/01/15 0750 07/01/15 1240  TROPONINI 0.04* 0.04* 0.03   BNP (last 3 results)  Recent Labs  06/30/15 2233  BNP 793.7*    CBG:  Recent Labs Lab 07/02/15 2125  07/03/15 0757 07/03/15 1213 07/03/15 1656 07/03/15 2217  GLUCAP 196* 212* 203* 283* 228*    Recent Results (from the past 240 hour(s))  Culture, blood (routine x 2)     Status: None (Preliminary result)   Collection Time: 06/30/15  6:31 PM  Result Value Ref Range Status   Specimen Description BLOOD LEFT ARM  Final   Special Requests BOTTLES DRAWN AEROBIC AND ANAEROBIC 3.5CC  Final   Culture NO GROWTH 3 DAYS  Final   Report Status PENDING  Incomplete  Culture, blood (routine x 2)  Status: None (Preliminary result)   Collection Time: 06/30/15  8:16 PM  Result Value Ref Range Status   Specimen Description BLOOD LEFT HAND  Final   Special Requests BOTTLES DRAWN AEROBIC AND ANAEROBIC 5CC  Final   Culture NO GROWTH 3 DAYS  Final   Report Status PENDING  Incomplete  Urine culture     Status: None   Collection Time: 06/30/15  8:21 PM  Result Value Ref Range Status   Specimen Description URINE, CLEAN CATCH  Final   Special Requests Normal  Final   Culture MULTIPLE SPECIES PRESENT, SUGGEST RECOLLECTION  Final   Report Status 07/01/2015 FINAL  Final  MRSA PCR Screening     Status: None   Collection Time: 07/01/15  1:23 AM  Result Value Ref Range Status   MRSA by PCR NEGATIVE NEGATIVE Final    Comment:        The GeneXpert MRSA Assay (FDA approved for NASAL specimens only), is one component of a comprehensive MRSA colonization surveillance program. It is not intended to diagnose MRSA infection nor to guide or monitor treatment for MRSA infections.       Studies/Results: Dg Chest Port 1 View  07/02/2015  CLINICAL DATA:  81 year old male with history of dyspnea. EXAM: PORTABLE CHEST 1 VIEW COMPARISON:  Chest x-ray 06/30/2015. FINDINGS: There is marked cephalization of the pulmonary vasculature, severe indistinctness of the interstitial markings, and extensive multifocal airspace disease throughout the lungs bilaterally suggestive of severe pulmonary edema. Small bilateral  pleural effusions. Mild cardiomegaly. Upper mediastinal contours are within normal limits. Atherosclerosis in the thoracic aorta. IMPRESSION: 1. Findings, as above, suggestive of congestive heart failure. 2. Atherosclerosis. Electronically Signed   By: Vinnie Langton M.D.   On: 07/02/2015 16:24    Medications:  Scheduled: . antiseptic oral rinse  7 mL Mouth Rinse BID  . atorvastatin  80 mg Oral QHS  . budesonide (PULMICORT) nebulizer solution  0.5 mg Nebulization BID  . feeding supplement (GLUCERNA SHAKE)  237 mL Oral BID PC  . feeding supplement (PRO-STAT SUGAR FREE 64)  30 mL Oral Daily  . furosemide  40 mg Intravenous Q12H  . gabapentin  300 mg Oral QHS  . guaiFENesin  600 mg Oral BID  . insulin aspart  0-9 Units Subcutaneous TID WC  . insulin glargine  10 Units Subcutaneous Daily  . ipratropium-albuterol  3 mL Nebulization QID  . latanoprost  1 drop Right Eye QHS  . levofloxacin (LEVAQUIN) IV  750 mg Intravenous Q48H  . methylPREDNISolone (SOLU-MEDROL) injection  60 mg Intravenous Daily  . metoprolol tartrate  12.5 mg Oral BID  . multivitamin with minerals  1 tablet Oral Daily  . nitroGLYCERIN  0.5 inch Topical 3 times per day  . pantoprazole  40 mg Oral BID  . ranolazine  500 mg Oral BID   Continuous: . sodium chloride     NOM:VEHMCNOBS, nitroGLYCERIN, polyvinyl alcohol  Assessment/Plan:  Principal Problem:   Sepsis ([place]) Active Problems:   CAD (coronary artery disease)   COPD (chronic obstructive pulmonary disease) (H[facility])   Anemia   Diabetes mellitus ([facility])   Diastolic CHF ([facility])   COPD exacerbation ([facility])   Acute on chronic respiratory failure (H[facility])   DVT (deep venous thrombosis) (H[facility])   AKI (acute kidney injury) ([place])   History of stroke   GERD (gastroesophageal reflux disease)   Malnutrition of moderate degree   Heme positive stool   Absolute anemia   History of peptic ulcer disease  Community-acquired pneumonia, admitted after failing outpatient  treatment Patient is improving. Blood cultures are negative. Continue Levaquin for now.   Fluid overload with acute respiratory failure/Acute on chronic Diastolic CHF Likely secondary to blood transfusion along with acute on chronic diastolic congestive heart failure. Patient has improved with the intravenous Lasix. He has not required the use of BiPAP for the last 24 hours. Continue to monitor ins and outs. Daily weights.  Normocytic anemia Hemoglobin was noted to be trending down. No overt bleeding noted. Recently he was found to have positive FOBT at his SNF. Apparently, plan was for referral to gastroenterology, but it hasn't occurred yet. Patient reports that he's had a colonoscopy and upper endoscopy but many years ago. Denies any black stools. Patient was seen by gastroenterology. EGD was done which did not show any concerning lesions. Patient and family wants to hold off on colonoscopy. Hemoglobin noted to be lower. This morning compared to yesterday. No reports of bleeding. Hemoglobin will be repeated this afternoon. Continue to monitor for now. He was transfused 2 units of blood 11/2.  Prior history of coronary artery disease Apparently not amenable to intervention. Patient had been experiencing chest pain which could have been due to angina secondary to significant anemia. Troponins were not significantly elevated. Pain has resolved. Continue Nitroglycerin paste for now. Echocardiogram as above. EKG did not show any ischemic changes.  History of severe aortic stenosis Stable. Echocardiogram as above.  History of COPD with probable mild exacerbation Seems to be improving. Continue nebulizer treatments. Continue to taper steroids.  History of peripheral vascular disease Holding his antiplatelet agent due to dropping hemoglobin. Continue to monitor closely.  History of essential hypertension Blood pressure was low at the time of admission. Antihypertensives on hold except for  metoprolol. Continue to monitor. Currently blood pressure is stable.  History of type 2 diabetes with hyperglycemia High blood sugars is most secondary to steroids. Patient was on metformin at home. HbA1c is 7.2. Continue Lantus for now. CBG should improve as steroid is tapered down.  Superficial Vein Thrombosis Doppler revealed acute superficial vein thrombosis involving the right cephalic vein. Symptomatic treatment.  DVT Prophylaxis: Change to SCDs    Code Status: DO NOT RESUSCITATE  Family Communication: Discussed with his daughter, Jeannene Patella  Disposition Plan: If repeat hemoglobin is stable, he could be transferred to telemetry later today. PT and OT are now recommending SNF. Social worker is following.      LOS: 4 days   [facility] Pager [phone] 07/04/2015, 7:37 AM  If 7PM-7AM, please contact night-coverage at [email or web address], password [facility]

## 2015-07-04 NOTE — Progress Notes (Signed)
Utilization Review Completed.  

## 2015-07-04 NOTE — Progress Notes (Signed)
Physical Therapy Treatment Patient Details Name: Eugene Bauer MRN: [id number] DOB: 1927/06/02 Today's Date: 07/04/2015    History of Present Illness [name] is a 81 y.o. male with PMH of hyperlipidemia, diabetes mellitus, COPD, GERD, CAD, s/p of stent placement, diastolic congestive heart failure, gastric ulcer, DJD, hepatitis, chronic back pain, right retinal embolus, stroke, prostate cancer (post status of radiation therapy), left nose basal cell carcinoma, DVT, bilateral leg stent placement for possible PAD, recently treated pneumonia, anemia, who presents with generalized weakness, cough and chest pain.    PT Comments    Pt admitted with above diagnosis. Pt currently with functional limitations due to balance and endurance deficits. Pt progressing with distance however still requiring O2 at present with ambulation or desats to 85%.  Still feel SNF for therapy will benefit pt.  Pt will benefit from skilled PT to increase their independence and safety with mobility to allow discharge to the venue listed below.    Follow Up Recommendations  SNF;Supervision/Assistance - 24 hour     Equipment Recommendations  None recommended by PT    Recommendations for Other Services       Precautions / Restrictions Precautions Precautions: Fall Restrictions Weight Bearing Restrictions: No    Mobility  Bed Mobility               General bed mobility comments: in chair on arrival  Transfers Overall transfer level: Needs assistance Equipment used: 4-wheeled walker Transfers: Sit to/from Stand Sit to Stand: Supervision         General transfer comment: no physical assist, pt pushed up from arms of chair.  Needed to use momentum but able to power up on his own.    Ambulation/Gait Ambulation/Gait assistance: Supervision;Min guard Ambulation Distance (Feet): 500 Feet (150 feet, 150 feet and then  200 feet) Assistive device: 4-wheeled walker Gait Pattern/deviations:  Step-through pattern;Decreased stride length;Wide base of support   Gait velocity interpretation: <1.8 ft/sec, indicative of risk for recurrent falls General Gait Details: Pt safe with 4 wheeled RW.  Maneuvers it well.  Able to sit and rest when needed.  Took 3 sitting rest breaks.  Removed O2 to ambulate however pt dropped to 85% therefore placed back on 3L and kept it on rest of treatment.  Sats 92% -96% once 3LO2 in place for ambulation.    Stairs            Wheelchair Mobility    Modified Rankin (Stroke Patients Only)       Balance Overall balance assessment: Needs assistance         Standing balance support: Bilateral upper extremity supported;During functional activity Standing balance-Leahy Scale: Poor Standing balance comment: relies on UE for support.                    Cognition Arousal/Alertness: Awake/alert Behavior During Therapy: WFL for tasks assessed/performed Overall Cognitive Status: Within Functional Limits for tasks assessed                      Exercises      General Comments        Pertinent Vitals/Pain Pain Assessment: No/denies pain  Desat to 85% on RA with activity.  Kept pt on 3LO2.      Home Living                      Prior Function            PT Goals (  current goals can now be found in the care plan section) Progress towards PT goals: Progressing toward goals    Frequency  Min 2X/week    PT Plan Current plan remains appropriate    Co-evaluation             End of Session Equipment Utilized During Treatment: Gait belt;Oxygen Activity Tolerance: Patient limited by fatigue Patient left: in chair;with call bell/phone within reach     Time: 7353-2992 PT Time Calculation (min) (ACUTE ONLY): 29 min  Charges:  $Gait Training: 23-37 mins                    G Codes:      [place] Jul 29, 2015, 4:05 PM Kappa Hayze Gazda,PT Acute Rehabilitation [phone] (pager)

## 2015-07-05 LAB — CBC
HEMATOCRIT: 27.7 % — AB (ref 39.0–52.0)
Hemoglobin: 9.4 g/dL — ABNORMAL LOW (ref 13.0–17.0)
MCH: 28.7 pg (ref 26.0–34.0)
MCHC: 33.9 g/dL (ref 30.0–36.0)
MCV: 84.5 fL (ref 78.0–100.0)
PLATELETS: 226 10*3/uL (ref 150–400)
RBC: 3.28 MIL/uL — ABNORMAL LOW (ref 4.22–5.81)
RDW: 15.7 % — ABNORMAL HIGH (ref 11.5–15.5)
WBC: 8.7 10*3/uL (ref 4.0–10.5)

## 2015-07-05 LAB — GLUCOSE, CAPILLARY
GLUCOSE-CAPILLARY: 140 mg/dL — AB (ref 65–99)
GLUCOSE-CAPILLARY: 229 mg/dL — AB (ref 65–99)
GLUCOSE-CAPILLARY: 318 mg/dL — AB (ref 65–99)
Glucose-Capillary: 150 mg/dL — ABNORMAL HIGH (ref 65–99)

## 2015-07-05 LAB — BASIC METABOLIC PANEL
Anion gap: 10 (ref 5–15)
BUN: 17 mg/dL (ref 6–20)
CALCIUM: 8.9 mg/dL (ref 8.9–10.3)
CO2: 31 mmol/L (ref 22–32)
CREATININE: 1.14 mg/dL (ref 0.61–1.24)
Chloride: 93 mmol/L — ABNORMAL LOW (ref 101–111)
GFR calc Af Amer: >60 mL/min (ref >60)
GFR, EST NON AFRICAN AMERICAN: 57 mL/min — AB (ref >60)
GLUCOSE: 165 mg/dL — AB (ref 65–99)
Potassium: 3.4 mmol/L — ABNORMAL LOW (ref 3.5–5.1)
Sodium: 134 mmol/L — ABNORMAL LOW (ref 135–145)

## 2015-07-05 LAB — CULTURE, BLOOD (ROUTINE X 2)
Culture: NO GROWTH
Culture: NO GROWTH

## 2015-07-05 MED ORDER — ISOSORBIDE MONONITRATE ER 30 MG PO TB24: 30.0000 mg | ORAL_TABLET | Freq: Every day | ORAL | Status: DC

## 2015-07-05 MED ORDER — INSULIN ASPART 100 UNIT/ML ~~LOC~~ SOLN
4.0000 [IU] | Freq: Once | SUBCUTANEOUS | Status: AC
  Administered 2015-07-05: 4 [IU] via SUBCUTANEOUS

## 2015-07-05 MED ORDER — POLYETHYLENE GLYCOL 3350 17 G PO PACK
17.0000 g | PACK | Freq: Every day | ORAL | Status: DC | PRN
Start: 2015-07-05 — End: 2015-07-07
  Administered 2015-07-06: 17 g via ORAL
  Filled 2015-07-05: qty 1

## 2015-07-05 MED ORDER — PREDNISONE 20 MG PO TABS
40.0000 mg | ORAL_TABLET | Freq: Every day | ORAL | Status: DC
  Administered 2015-07-05 – 2015-07-07 (×3): 40 mg via ORAL
  Filled 2015-07-05 (×3): qty 2

## 2015-07-05 MED ORDER — INSULIN GLARGINE 100 UNIT/ML ~~LOC~~ SOLN
5.0000 [IU] | Freq: Every day | SUBCUTANEOUS | Status: DC
  Administered 2015-07-06 – 2015-07-07 (×2): 5 [IU] via SUBCUTANEOUS
  Filled 2015-07-05 (×3): qty 0.05

## 2015-07-05 MED ORDER — POTASSIUM CHLORIDE CRYS ER 20 MEQ PO TBCR
40.0000 meq | EXTENDED_RELEASE_TABLET | Freq: Once | ORAL | Status: AC
  Administered 2015-07-05: 40 meq via ORAL
  Filled 2015-07-05: qty 2

## 2015-07-05 NOTE — Progress Notes (Signed)
[facility] PROGRESS NOTE  Eugene Bauer GUR:[id number] DOB: [birth year] DOA: 06/30/2015  [facility]  Brief HPI: 81 year old Caucasian male with a past medical history of moderate to severe COPD on home oxygen, history of aortic stenosis, chronic diastolic congestive heart failure, coronary artery disease, peripheral vascular disease was diagnosed with pneumonia on October 16. He was treated with oral antibiotics for same. He also was found to have positive fecal occult blood testing. Patient presented with fever, shortness of breath, chills. Family felt that patient had not responded appropriately to oral antibiotics. He was hospitalized for further management. During this hospitalization, he developed fluid overload treated with Lasix. Required BiPAP. Also noted to have hemoglobin that was trending down. Underwent EGD.  Past medical history:  Past Medical History  Diagnosis Date  . Aortic stenosis   . High cholesterol   . Retinal artery occlusion   . GERD (gastroesophageal reflux disease)   . Coronary artery disease   . Heart murmur   . On home oxygen therapy     "2L all the time" (01/02/2013)  . Angina   . Hypertension   . CHF (congestive heart failure) ([place])   . MI (myocardial infarction) ([place])     "I've had several; last one was 03/09/2012" (01/02/2013  . Asthma   . Pneumonia     "today; have had it at least twice before" (01/02/2013)  . Shortness of breath     "most of the time recently" (01/02/2013)  . Chronic bronchitis ([place])   . History of stomach ulcers     "have them now; they flare up when I eat the wrong thing or don't eat" (01/02/2013)  . Hepatitis     "don't know which one" (01/02/2013)  . EGBTDVVO(160.7)     "probably weekly" (01/02/2013)  . Arthritis     "all over" (01/02/2013)  . DJD (degenerative joint disease)     "all over" (01/02/2013)  . Chronic lower back pain   . Urinary urgency   . Anemia     chronic/notes 01/02/2013  . Retinal embolus, right eye 2010    . Stroke [facility])     "several; left ankle/foot still weak from one of them" (01/02/2013), 2006  . COPD (chronic obstructive pulmonary disease) ([place])   . Emphysema   . Type II diabetes mellitus ([place])     Controlled by Metformin  . Prostate cancer [facility])     "had radiation treatments" (01/02/2013)  . Basal cell carcinoma     "left nose, behind right knee" (01/02/2013)    Consultants: Gastroenterology  Procedures:  ECHO Study Conclusions - Left ventricle: The cavity size was normal. Wall thickness wasincreased in a pattern of mild LVH. There was focal basalhypertrophy. Systolic function was normal. The estimated ejectionfraction was in the range of 50% to 55%. Wall motion was normal;there were no regional wall motion abnormalities. - Aortic valve: There was moderate to severe stenosis. There wastrivial regurgitation. Valve area (VTI): 0.89 cm^2. Valve area(Vmax): 0.86 cm^2. Valve area (Vmean): 0.78 cm^2. - Mitral valve: Mildly to moderately calcified annulus. There wasmild regurgitation. Valve area by continuity equation (using LVOTflow): 2.19 cm^2. - Right atrium: The atrium was mildly dilated. - Tricuspid valve: There was mild-moderate regurgitation. - Pulmonary arteries: Systolic pressure was moderately increased.PA peak pressure: 62 mm Hg (S).  EGD 11/3 ENDOSCOPIC IMPRESSION: 1. The mucosa of the esophagus appeared normal 2. Mild gastritis (inflammation) was found in the entire examined stomach 3. The duodenal mucosa showed no abnormalities in the  bulb, 2nd part, and examined portions of D3  Antibiotics: Vancomycin and Zosyn until 11/3 Levaquin 11/3  Subjective: Patient continues to feel better. He tells me, however, that he couldn't sleep well overnight. Denies any shortness of breath. Occasional wheezing.   Objective: Vital Signs  Filed Vitals:   07/05/15 0022 07/05/15 0647 07/05/15 0749 07/05/15 0812  BP: 127/74 120/70 125/67   Pulse: 93 90 78   Temp: 98 F  (36.7 C) 97.6 F (36.4 C) 97.6 F (36.4 C)   TempSrc: Oral Oral Oral   Resp: 18 20 18    Height:      Weight:  72.621 kg (160 lb 1.6 oz)    SpO2: 100% 99% 99% 98%    Intake/Output Summary (Last 24 hours) at 07/05/15 3875 Last data filed at 07/05/15 0022  Gross per 24 hour  Intake    840 ml  Output   2200 ml  Net  -1360 ml   Filed Weights   07/04/15 0450 07/04/15 1837 07/05/15 0647  Weight: 78.5 kg (173 lb 1 oz) 74.662 kg (164 lb 9.6 oz) 72.621 kg (160 lb 1.6 oz)    General appearance: alert, cooperative, appears stated age and no distress Resp: Continues to have good air entry bilaterally. Very few crackles at the bases. No wheezing. No rhonchi.  Cardio: regular rate and rhythm, S1, S2 normal, no murmur, click, rub or gallop GI: soft, non-tender; bowel sounds normal; no masses,  no organomegaly Extremities: Improving pedal edema Neurologic: No focal deficits  Lab Results:  Basic Metabolic Panel:  Recent Labs Lab 07/01/15 1240 07/02/15 0227 07/03/15 0434 07/04/15 0509 07/05/15 0346  NA 134* 132* 134* 135 134*  K 4.6 4.3 4.0 3.5 3.4*  CL 100* 100* 98* 96* 93*  CO2 29 27 26 30 31   GLUCOSE 256* 218* 251* 166* 165*  BUN 16 19 16 17 17   CREATININE 1.10 1.16 1.19 1.13 1.14  CALCIUM 8.6* 8.2* 8.6* 8.5* 8.9   Liver Function Tests:  Recent Labs Lab 07/01/15 1240  AST 17  ALT 14*  ALKPHOS 60  BILITOT 0.5  PROT 5.1*  ALBUMIN 2.1*   CBC:  Recent Labs Lab 07/01/15 1240 07/02/15 0227 07/02/15 1514 07/03/15 0434 07/04/15 0509 07/04/15 1240 07/05/15 0346  WBC 6.7 7.4  --  8.2 8.8 11.1* 8.7  NEUTROABS 6.1 5.8  --   --   --   --   --   HGB 7.1* 6.3* 11.4* 10.0* 8.8* 10.3* 9.4*  HCT 21.1* 19.2* 33.9* 29.0* 25.9* 30.8* 27.7*  MCV 85.1 85.0  --  84.1 84.6 84.6 84.5  PLT 183 206  --  208 209 246 226   Cardiac Enzymes:  Recent Labs Lab 07/01/15 0111 07/01/15 0750 07/01/15 1240  TROPONINI 0.04* 0.04* 0.03   BNP (last 3 results)  Recent Labs   06/30/15 2233  BNP 793.7*    CBG:  Recent Labs Lab 07/04/15 0755 07/04/15 1202 07/04/15 1611 07/04/15 2141 07/05/15 0627  GLUCAP 134* 247* 311* 268* 140*    Recent Results (from the past 240 hour(s))  Culture, blood (routine x 2)     Status: None (Preliminary result)   Collection Time: 06/30/15  6:31 PM  Result Value Ref Range Status   Specimen Description BLOOD LEFT ARM  Final   Special Requests BOTTLES DRAWN AEROBIC AND ANAEROBIC 3.5CC  Final   Culture NO GROWTH 4 DAYS  Final   Report Status PENDING  Incomplete  Culture, blood (routine x 2)  Status: None (Preliminary result)   Collection Time: 06/30/15  8:16 PM  Result Value Ref Range Status   Specimen Description BLOOD LEFT HAND  Final   Special Requests BOTTLES DRAWN AEROBIC AND ANAEROBIC 5CC  Final   Culture NO GROWTH 4 DAYS  Final   Report Status PENDING  Incomplete  Urine culture     Status: None   Collection Time: 06/30/15  8:21 PM  Result Value Ref Range Status   Specimen Description URINE, CLEAN CATCH  Final   Special Requests Normal  Final   Culture MULTIPLE SPECIES PRESENT, SUGGEST RECOLLECTION  Final   Report Status 07/01/2015 FINAL  Final  MRSA PCR Screening     Status: None   Collection Time: 07/01/15  1:23 AM  Result Value Ref Range Status   MRSA by PCR NEGATIVE NEGATIVE Final    Comment:        The GeneXpert MRSA Assay (FDA approved for NASAL specimens only), is one component of a comprehensive MRSA colonization surveillance program. It is not intended to diagnose MRSA infection nor to guide or monitor treatment for MRSA infections.       Studies/Results: No results found.  Medications:  Scheduled: . atorvastatin  80 mg Oral QHS  . budesonide (PULMICORT) nebulizer solution  0.5 mg Nebulization BID  . feeding supplement (GLUCERNA SHAKE)  237 mL Oral BID PC  . feeding supplement (PRO-STAT SUGAR FREE 64)  30 mL Oral Daily  . furosemide  40 mg Intravenous Q12H  . gabapentin  300 mg  Oral QHS  . guaiFENesin  600 mg Oral BID  . insulin aspart  0-9 Units Subcutaneous TID WC  . insulin glargine  10 Units Subcutaneous Daily  . ipratropium-albuterol  3 mL Nebulization QID  . latanoprost  1 drop Right Eye QHS  . levofloxacin (LEVAQUIN) IV  750 mg Intravenous Q48H  . methylPREDNISolone (SOLU-MEDROL) injection  40 mg Intravenous Daily  . metoprolol tartrate  12.5 mg Oral BID  . multivitamin with minerals  1 tablet Oral Daily  . nitroGLYCERIN  0.5 inch Topical 3 times per day  . pantoprazole  40 mg Oral BID  . potassium chloride  40 mEq Oral Once  . ranolazine  500 mg Oral BID   Continuous:   ENI:DPOEUMPNT, nitroGLYCERIN, polyvinyl alcohol  Assessment/Plan:  Principal Problem:   Sepsis ([place]) Active Problems:   CAD (coronary artery disease)   COPD (chronic obstructive pulmonary disease) (HCC)   Anemia   Diabetes mellitus (HCC)   Diastolic CHF (HCC)   COPD exacerbation (HCC)   Acute on chronic respiratory failure (HCC)   DVT (deep venous thrombosis) (HCC)   AKI (acute kidney injury) ([place])   History of stroke   GERD (gastroesophageal reflux disease)   Malnutrition of moderate degree   Heme positive stool   Absolute anemia   History of peptic ulcer disease    Community-acquired pneumonia, admitted after failing outpatient treatment Patient is improving. Blood cultures are negative. Continue Levaquin for now.   Fluid overload with acute respiratory failure/Acute on chronic Diastolic CHF Likely secondary to blood transfusion along with acute on chronic diastolic congestive heart failure. Patient has improved with the intravenous Lasix. He is diuresing well. His weight is decreasing. Continue to monitor ins and outs. Daily weights. Anticipate changing to oral Lasix tomorrow.  Normocytic anemia Hemoglobin was noted to be trending down. No overt bleeding noted. Recently he was found to have positive FOBT at his SNF. Apparently, plan was for referral to  gastroenterology, but it hadn't occurred prior to this admission. Patient reports that he's had a colonoscopy and upper endoscopy but many years ago. Denies any black stools. Patient was seen by gastroenterology. EGD was done which did not show any concerning lesions. Patient and family wants to hold off on colonoscopy. Abdomen is low but stable. No reports of bleeding. Continue to monitor for now. He was transfused 2 units of blood on 11/2.  Prior history of coronary artery disease Apparently not amenable to intervention. Patient had been experiencing chest pain which could have been due to angina secondary to significant anemia. Troponins were not significantly elevated. Pain has resolved. Discontinue nitroglycerin paste. Continue Ranexa. Echocardiogram as above. EKG did not show any ischemic changes.  History of moderate to severe aortic stenosis Stable. Echocardiogram as above.  History of COPD with probable mild exacerbation Seems to be improving. Continue nebulizer treatments. Change to oral steroids.  History of peripheral vascular disease Antiplatelet agent was held due to dropping hemoglobin. Continue to monitor closely. Should be able to resume aspirin at the time of discharge.  History of essential hypertension Blood pressure was low at the time of admission. Antihypertensives on hold except for metoprolol. Continue to monitor. Currently blood pressure is stable.  History of type 2 diabetes with hyperglycemia High blood sugars is most secondary to steroids. Patient was on metformin at home. HbA1c is 7.2. He was started on Lantus due to hyperglycemia. Monitor closely as the steroid is being tapered down. CBG should improve as steroid is tapered down.  Superficial Vein Thrombosis Doppler revealed acute superficial vein thrombosis involving the right cephalic vein. Symptomatic treatment.  DVT Prophylaxis: Change to SCDs    Code Status: DO NOT RESUSCITATE  Family Communication:  Discussed with his daughter, Jeannene Patella  Disposition Plan: PT and OT are now recommending SNF. Anticipate discharge on Monday.     LOS: 5 days   [place] [facility] Pager [phone] 07/05/2015, 8:32 AM  If 7PM-7AM, please contact night-coverage at [email or web address], password [email or web address]

## 2015-07-06 LAB — BASIC METABOLIC PANEL
Anion gap: 7 (ref 5–15)
BUN: 19 mg/dL (ref 6–20)
CALCIUM: 9.1 mg/dL (ref 8.9–10.3)
CO2: 33 mmol/L — AB (ref 22–32)
CREATININE: 1.14 mg/dL (ref 0.61–1.24)
Chloride: 94 mmol/L — ABNORMAL LOW (ref 101–111)
GFR calc non Af Amer: 57 mL/min — ABNORMAL LOW (ref >60)
Glucose, Bld: 192 mg/dL — ABNORMAL HIGH (ref 65–99)
Potassium: 3.5 mmol/L (ref 3.5–5.1)
Sodium: 134 mmol/L — ABNORMAL LOW (ref 135–145)

## 2015-07-06 LAB — CBC
HCT: 28.3 % — ABNORMAL LOW (ref 39.0–52.0)
Hemoglobin: 9.5 g/dL — ABNORMAL LOW (ref 13.0–17.0)
MCH: 28.3 pg (ref 26.0–34.0)
MCHC: 33.6 g/dL (ref 30.0–36.0)
MCV: 84.2 fL (ref 78.0–100.0)
PLATELETS: 244 10*3/uL (ref 150–400)
RBC: 3.36 MIL/uL — AB (ref 4.22–5.81)
RDW: 15.9 % — ABNORMAL HIGH (ref 11.5–15.5)
WBC: 8.7 10*3/uL (ref 4.0–10.5)

## 2015-07-06 LAB — GLUCOSE, CAPILLARY
GLUCOSE-CAPILLARY: 153 mg/dL — AB (ref 65–99)
GLUCOSE-CAPILLARY: 214 mg/dL — AB (ref 65–99)
Glucose-Capillary: 330 mg/dL — ABNORMAL HIGH (ref 65–99)

## 2015-07-06 MED ORDER — LEVOFLOXACIN 750 MG PO TABS
750.0000 mg | ORAL_TABLET | Freq: Once | ORAL | Status: AC
  Administered 2015-07-07: 750 mg via ORAL
  Filled 2015-07-06 (×2): qty 1

## 2015-07-06 MED ORDER — POTASSIUM CHLORIDE CRYS ER 20 MEQ PO TBCR
40.0000 meq | EXTENDED_RELEASE_TABLET | Freq: Once | ORAL | Status: AC
  Administered 2015-07-06: 40 meq via ORAL
  Filled 2015-07-06: qty 2

## 2015-07-06 MED ORDER — BENZONATATE 100 MG PO CAPS: 100.0000 mg | ORAL_CAPSULE | Freq: Three times a day (TID) | ORAL | Status: DC | PRN

## 2015-07-06 MED ORDER — FUROSEMIDE 40 MG PO TABS
40.0000 mg | ORAL_TABLET | Freq: Two times a day (BID) | ORAL | Status: DC
  Administered 2015-07-06 – 2015-07-07 (×3): 40 mg via ORAL
  Filled 2015-07-06 (×3): qty 1

## 2015-07-06 NOTE — Progress Notes (Signed)
Pt ambulated the length of hallway with 2 rest breaks, using 3l O2 and walker. Tol well

## 2015-07-06 NOTE — Progress Notes (Signed)
[facility] PROGRESS NOTE  Eugene Bauer OHY:[id number] DOB: [birth year] DOA: 06/30/2015  [facility]  Brief HPI: 81 year old Caucasian male with a past medical history of moderate to severe COPD on home oxygen, history of aortic stenosis, chronic diastolic congestive heart failure, coronary artery disease, peripheral vascular disease was diagnosed with pneumonia on October 16. He was treated with oral antibiotics for same. He also was found to have positive fecal occult blood testing. Patient presented with fever, shortness of breath, chills. Family felt that patient had not responded appropriately to oral antibiotics. He was hospitalized for further management. During this hospitalization, he developed fluid overload treated with Lasix. Required BiPAP. Also noted to have hemoglobin that was trending down. Underwent EGD.  Past medical history:  Past Medical History  Diagnosis Date  . Aortic stenosis   . High cholesterol   . Retinal artery occlusion   . GERD (gastroesophageal reflux disease)   . Coronary artery disease   . Heart murmur   . On home oxygen therapy     "2L all the time" (01/02/2013)  . Angina   . Hypertension   . CHF (congestive heart failure) ([place])   . MI (myocardial infarction) ([place])     "I've had several; last one was 03/09/2012" (01/02/2013  . Asthma   . Pneumonia     "today; have had it at least twice before" (01/02/2013)  . Shortness of breath     "most of the time recently" (01/02/2013)  . Chronic bronchitis ([place])   . History of stomach ulcers     "have them now; they flare up when I eat the wrong thing or don't eat" (01/02/2013)  . Hepatitis     "don't know which one" (01/02/2013)  . RSWNIOEV(035.0)     "probably weekly" (01/02/2013)  . Arthritis     "all over" (01/02/2013)  . DJD (degenerative joint disease)     "all over" (01/02/2013)  . Chronic lower back pain   . Urinary urgency   . Anemia     chronic/notes 01/02/2013  . Retinal embolus, right eye 2010    . Stroke [facility])     "several; left ankle/foot still weak from one of them" (01/02/2013), 2006  . COPD (chronic obstructive pulmonary disease) ([place])   . Emphysema   . Type II diabetes mellitus ([place])     Controlled by Metformin  . Prostate cancer [facility])     "had radiation treatments" (01/02/2013)  . Basal cell carcinoma     "left nose, behind right knee" (01/02/2013)    Consultants: Gastroenterology  Procedures:  ECHO Study Conclusions - Left ventricle: The cavity size was normal. Wall thickness wasincreased in a pattern of mild LVH. There was focal basalhypertrophy. Systolic function was normal. The estimated ejectionfraction was in the range of 50% to 55%. Wall motion was normal;there were no regional wall motion abnormalities. - Aortic valve: There was moderate to severe stenosis. There wastrivial regurgitation. Valve area (VTI): 0.89 cm^2. Valve area(Vmax): 0.86 cm^2. Valve area (Vmean): 0.78 cm^2. - Mitral valve: Mildly to moderately calcified annulus. There wasmild regurgitation. Valve area by continuity equation (using LVOTflow): 2.19 cm^2. - Right atrium: The atrium was mildly dilated. - Tricuspid valve: There was mild-moderate regurgitation. - Pulmonary arteries: Systolic pressure was moderately increased.PA peak pressure: 62 mm Hg (S).  EGD 11/3 ENDOSCOPIC IMPRESSION: 1. The mucosa of the esophagus appeared normal 2. Mild gastritis (inflammation) was found in the entire examined stomach 3. The duodenal mucosa showed no abnormalities in the  bulb, 2nd part, and examined portions of D3  Antibiotics: Vancomycin and Zosyn until 11/3 Levaquin 11/3  Subjective: Patient feels well. Continues to complain of cough which is chronic. No chest pain. Breathing is much improved.    Objective: Vital Signs  Filed Vitals:   07/05/15 1446 07/05/15 2013 07/05/15 2116 07/06/15 0554  BP:  112/50  122/47  Pulse:  88  80  Temp:  98.2 F (36.8 C)  99.2 F (37.3 C)  TempSrc:   Oral  Oral  Resp:  18  18  Height:      Weight:    72.167 kg (159 lb 1.6 oz)  SpO2: 98% 99% 100% 96%    Intake/Output Summary (Last 24 hours) at 07/06/15 0757 Last data filed at 07/06/15 0600  Gross per 24 hour  Intake    440 ml  Output   1951 ml  Net  -1511 ml   Filed Weights   07/04/15 1837 07/05/15 0647 07/06/15 0554  Weight: 74.662 kg (164 lb 9.6 oz) 72.621 kg (160 lb 1.6 oz) 72.167 kg (159 lb 1.6 oz)    General appearance: alert, cooperative, appears stated age and no distress Resp: Continues to have good air entry bilaterally. Very few crackles at the bases. No wheezing. No rhonchi. Similar to yesterday. Cardio: regular rate and rhythm, S1, S2 normal, systolic murmur. no click, rub or gallop GI: soft, non-tender; bowel sounds normal; no masses,  no organomegaly Extremities: Improving pedal edema Neurologic: No focal deficits  Lab Results:  Basic Metabolic Panel:  Recent Labs Lab 07/02/15 0227 07/03/15 0434 07/04/15 0509 07/05/15 0346 07/06/15 0139  NA 132* 134* 135 134* 134*  K 4.3 4.0 3.5 3.4* 3.5  CL 100* 98* 96* 93* 94*  CO2 27 26 30 31  33*  GLUCOSE 218* 251* 166* 165* 192*  BUN 19 16 17 17 19   CREATININE 1.16 1.19 1.13 1.14 1.14  CALCIUM 8.2* 8.6* 8.5* 8.9 9.1   Liver Function Tests:  Recent Labs Lab 07/01/15 1240  AST 17  ALT 14*  ALKPHOS 60  BILITOT 0.5  PROT 5.1*  ALBUMIN 2.1*   CBC:  Recent Labs Lab 07/01/15 1240 07/02/15 0227  07/03/15 0434 07/04/15 0509 07/04/15 1240 07/05/15 0346 07/06/15 0139  WBC 6.7 7.4  --  8.2 8.8 11.1* 8.7 8.7  NEUTROABS 6.1 5.8  --   --   --   --   --   --   HGB 7.1* 6.3*  < > 10.0* 8.8* 10.3* 9.4* 9.5*  HCT 21.1* 19.2*  < > 29.0* 25.9* 30.8* 27.7* 28.3*  MCV 85.1 85.0  --  84.1 84.6 84.6 84.5 84.2  PLT 183 206  --  208 209 246 226 244  < > = values in this interval not displayed. Cardiac Enzymes:  Recent Labs Lab 07/01/15 0111 07/01/15 0750 07/01/15 1240  TROPONINI 0.04* 0.04* 0.03   BNP  (last 3 results)  Recent Labs  06/30/15 2233  BNP 793.7*    CBG:  Recent Labs Lab 07/05/15 0627 07/05/15 1122 07/05/15 1609 07/05/15 2105 07/06/15 0555  GLUCAP 140* 150* 229* 318* 153*    Recent Results (from the past 240 hour(s))  Culture, blood (routine x 2)     Status: None   Collection Time: 06/30/15  6:31 PM  Result Value Ref Range Status   Specimen Description BLOOD LEFT ARM  Final   Special Requests BOTTLES DRAWN AEROBIC AND ANAEROBIC 3.5CC  Final   Culture NO GROWTH 5 DAYS  Final  Report Status 07/05/2015 FINAL  Final  Culture, blood (routine x 2)     Status: None   Collection Time: 06/30/15  8:16 PM  Result Value Ref Range Status   Specimen Description BLOOD LEFT HAND  Final   Special Requests BOTTLES DRAWN AEROBIC AND ANAEROBIC 5CC  Final   Culture NO GROWTH 5 DAYS  Final   Report Status 07/05/2015 FINAL  Final  Urine culture     Status: None   Collection Time: 06/30/15  8:21 PM  Result Value Ref Range Status   Specimen Description URINE, CLEAN CATCH  Final   Special Requests Normal  Final   Culture MULTIPLE SPECIES PRESENT, SUGGEST RECOLLECTION  Final   Report Status 07/01/2015 FINAL  Final  MRSA PCR Screening     Status: None   Collection Time: 07/01/15  1:23 AM  Result Value Ref Range Status   MRSA by PCR NEGATIVE NEGATIVE Final    Comment:        The GeneXpert MRSA Assay (FDA approved for NASAL specimens only), is one component of a comprehensive MRSA colonization surveillance program. It is not intended to diagnose MRSA infection nor to guide or monitor treatment for MRSA infections.       Studies/Results: No results found.  Medications:  Scheduled: . atorvastatin  80 mg Oral QHS  . budesonide (PULMICORT) nebulizer solution  0.5 mg Nebulization BID  . feeding supplement (GLUCERNA SHAKE)  237 mL Oral BID PC  . feeding supplement (PRO-STAT SUGAR FREE 64)  30 mL Oral Daily  . furosemide  40 mg Oral BID  . gabapentin  300 mg Oral QHS    . guaiFENesin  600 mg Oral BID  . insulin aspart  0-9 Units Subcutaneous TID WC  . insulin glargine  5 Units Subcutaneous Daily  . ipratropium-albuterol  3 mL Nebulization QID  . latanoprost  1 drop Right Eye QHS  . [START ON 07/07/2015] levofloxacin  750 mg Oral Once  . metoprolol tartrate  12.5 mg Oral BID  . multivitamin with minerals  1 tablet Oral Daily  . pantoprazole  40 mg Oral BID  . predniSONE  40 mg Oral Q breakfast  . ranolazine  500 mg Oral BID   Continuous:   UMP:NTIRWERXV, benzonatate, nitroGLYCERIN, polyethylene glycol, polyvinyl alcohol  Assessment/Plan:  Principal Problem:   Sepsis ([place]) Active Problems:   CAD (coronary artery disease)   COPD (chronic obstructive pulmonary disease) ([facility])   Anemia   Diabetes mellitus ([facility])   Diastolic CHF (H[facility])   COPD exacerbation ([facility])   Acute on chronic respiratory failure (HCC)   DVT (deep venous thrombosis) ([facility])   AKI (acute kidney injury) ([employer])   History of stroke   GERD (gastroesophageal reflux disease)   Malnutrition of moderate degree   Heme positive stool   Absolute anemia   History of peptic ulcer disease    Community-acquired pneumonia, admitted after failing outpatient treatment Patient is improving. Blood cultures are negative. Continue Levaquin for now.   Fluid overload with acute respiratory failure/Acute on chronic Diastolic CHF Likely secondary to blood transfusion along with acute on chronic diastolic congestive heart failure. Patient has improved with intravenous Lasix. He is diuresing well. His weight is decreasing. Continue to monitor ins and outs. Daily weights. Change to oral Lasix today.   Normocytic anemia Hemoglobin was noted to be trending down. No overt bleeding noted. Recently he was found to have positive FOBT at his SNF. Apparently, plan was for referral to gastroenterology, but it  hadn't occurred prior to this admission. Patient reports that he's had a colonoscopy and upper endoscopy but  many years ago. Denies any black stools. Patient was seen by gastroenterology. EGD was done which did not show any concerning lesions. Patient and family wants to hold off on colonoscopy. No reports of bleeding. Continue to monitor for now. He was transfused 2 units of blood on 11/2. Hemoglobin remains stable.  Prior history of coronary artery disease Apparently not amenable to intervention. Patient had been experiencing chest pain which could have been due to angina secondary to significant anemia. Troponins were not significantly elevated. Pain has resolved. Discontinue nitroglycerin paste. Continue Ranexa. Echocardiogram as above. EKG did not show any ischemic changes.  History of moderate to severe aortic stenosis Stable. Echocardiogram as above.  History of COPD with probable mild exacerbation Seems to be improving. Continue nebulizer treatments. Change to oral steroids.  History of peripheral vascular disease Antiplatelet agent was held due to dropping hemoglobin. Continue to monitor closely. Should be able to resume aspirin at the time of discharge.  History of essential hypertension Blood pressure was low at the time of admission. Antihypertensives on hold except for metoprolol. Continue to monitor. Currently blood pressure is stable.  History of type 2 diabetes with hyperglycemia High blood sugars is most likely secondary to steroids. Now improving. Patient was on metformin at home. HbA1c is 7.2. He was started on Lantus due to hyperglycemia. Monitor closely as the steroid is being tapered down. CBG should improve as steroid is tapered down.  Superficial Vein Thrombosis Doppler revealed acute superficial vein thrombosis involving the right cephalic vein. Symptomatic treatment.  DVT Prophylaxis: Change to SCDs    Code Status: DO NOT RESUSCITATE  Family Communication: Discussed with his daughter, Jeannene Patella  Disposition Plan: PT and OT are now recommending SNF. Anticipate discharge on  Monday.     LOS: 6 days   [place] [facility] Pager [phone] 07/06/2015, 7:57 AM  If 7PM-7AM, please contact night-coverage at [email or web address], password [facility]

## 2015-07-07 LAB — GLUCOSE, CAPILLARY
GLUCOSE-CAPILLARY: 135 mg/dL — AB (ref 65–99)
Glucose-Capillary: 269 mg/dL — ABNORMAL HIGH (ref 65–99)

## 2015-07-07 LAB — BASIC METABOLIC PANEL
Anion gap: 8 (ref 5–15)
BUN: 18 mg/dL (ref 6–20)
CALCIUM: 9.3 mg/dL (ref 8.9–10.3)
CHLORIDE: 93 mmol/L — AB (ref 101–111)
CO2: 34 mmol/L — ABNORMAL HIGH (ref 22–32)
CREATININE: 1.21 mg/dL (ref 0.61–1.24)
GFR calc Af Amer: >60 mL/min (ref >60)
GFR, EST NON AFRICAN AMERICAN: 53 mL/min — AB (ref >60)
Glucose, Bld: 152 mg/dL — ABNORMAL HIGH (ref 65–99)
Potassium: 3.4 mmol/L — ABNORMAL LOW (ref 3.5–5.1)
SODIUM: 135 mmol/L (ref 135–145)

## 2015-07-07 MED ORDER — POTASSIUM CHLORIDE ER 20 MEQ PO TBCR: 20.0000 meq | EXTENDED_RELEASE_TABLET | Freq: Every day | ORAL | Status: AC

## 2015-07-07 MED ORDER — POLYETHYLENE GLYCOL 3350 17 G PO PACK: 17.0000 g | PACK | Freq: Every day | ORAL | Status: AC | PRN

## 2015-07-07 MED ORDER — BENZONATATE 100 MG PO CAPS: 100.0000 mg | ORAL_CAPSULE | Freq: Three times a day (TID) | ORAL | Status: AC | PRN

## 2015-07-07 MED ORDER — PREDNISONE 20 MG PO TABS: ORAL_TABLET | ORAL | Status: AC

## 2015-07-07 MED ORDER — INSULIN ASPART 100 UNIT/ML ~~LOC~~ SOLN: SUBCUTANEOUS | Status: AC

## 2015-07-07 MED ORDER — POTASSIUM CHLORIDE CRYS ER 20 MEQ PO TBCR
40.0000 meq | EXTENDED_RELEASE_TABLET | Freq: Once | ORAL | Status: AC
  Administered 2015-07-07: 40 meq via ORAL
  Filled 2015-07-07: qty 2

## 2015-07-07 MED ORDER — FUROSEMIDE 40 MG PO TABS: 40.0000 mg | ORAL_TABLET | Freq: Every day | ORAL | Status: AC

## 2015-07-07 NOTE — Progress Notes (Signed)
IV removed per discharge order. EMS here to transport patient to [facility].

## 2015-07-07 NOTE — Discharge Summary (Signed)
[facility]  Physician Discharge Summary   Patient ID: Eugene Bauer MRN: [id number] DOB/AGE: 1926-09-18 81 y.o.  Admit date: 06/30/2015 Discharge date: 07/07/2015  PCP: Pcp Not In System  DISCHARGE DIAGNOSES:  Principal Problem:   Sepsis ([place]) Active Problems:   CAD (coronary artery disease)   COPD (chronic obstructive pulmonary disease) (HCC)   Anemia   Diabetes mellitus (HCC)   Diastolic CHF (HCC)   COPD exacerbation (HCC)   Acute on chronic respiratory failure (HCC)   DVT (deep venous thrombosis) (HCC)   AKI (acute kidney injury) ([place])   History of stroke   GERD (gastroesophageal reflux disease)   Malnutrition of moderate degree   Heme positive stool   Absolute anemia   History of peptic ulcer disease   RECOMMENDATIONS FOR OUTPATIENT FOLLOW UP: CBC and BMET in 3 days.  Check CBG's three times daily till on steroids.   DISCHARGE CONDITION: fair  Diet recommendation: Modified Carb  Filed Weights   07/05/15 0647 07/06/15 0554 07/07/15 0553  Weight: 72.621 kg (160 lb 1.6 oz) 72.167 kg (159 lb 1.6 oz) 70.852 kg (156 lb 3.2 oz)    INITIAL HISTORY: 81 year old Caucasian male with a past medical history of moderate to severe COPD on home oxygen, history of aortic stenosis, chronic diastolic congestive heart failure, coronary artery disease, peripheral vascular disease was diagnosed with pneumonia on October 16. He was treated with oral antibiotics for same. He also was found to have positive fecal occult blood testing. Patient presented with fever, shortness of breath, chills. Family felt that patient had not responded appropriately to oral antibiotics. He was hospitalized for further management. During this hospitalization, he developed fluid overload treated with Lasix. Required BiPAP. Also noted to have hemoglobin that was trending down. Underwent EGD.  Consultants: Gastroenterology  Procedures:  ECHO Study Conclusions - Left ventricle: The cavity size  was normal. Wall thickness wasincreased in a pattern of mild LVH. There was focal basalhypertrophy. Systolic function was normal. The estimated ejectionfraction was in the range of 50% to 55%. Wall motion was normal;there were no regional wall motion abnormalities. - Aortic valve: There was moderate to severe stenosis. There wastrivial regurgitation. Valve area (VTI): 0.89 cm^2. Valve area(Vmax): 0.86 cm^2. Valve area (Vmean): 0.78 cm^2. - Mitral valve: Mildly to moderately calcified annulus. There wasmild regurgitation. Valve area by continuity equation (using LVOTflow): 2.19 cm^2. - Right atrium: The atrium was mildly dilated. - Tricuspid valve: There was mild-moderate regurgitation. - Pulmonary arteries: Systolic pressure was moderately increased.PA peak pressure: 62 mm Hg (S).  EGD 11/3 ENDOSCOPIC IMPRESSION: 1. The mucosa of the esophagus appeared normal 2. Mild gastritis (inflammation) was found in the entire examined stomach 3. The duodenal mucosa showed no abnormalities in the bulb, 2nd part, and examined portions of D3   HOSPITAL COURSE:   Community-acquired pneumonia, admitted after failing outpatient treatment Patient is improving. Blood cultures are negative. Patient has completed course of antibiotics.  Fluid overload with acute respiratory failure/Acute on chronic Diastolic CHF Likely secondary to blood transfusion along with acute on chronic diastolic congestive heart failure. Patient was started on intravenous Lasix. He started diuresing well. His weight is decreasing. He was transitioned to oral Lasix. Much improved.   Normocytic anemia Hemoglobin was noted to be trending down. No overt bleeding was noted. Recently he was found to have positive FOBT at his SNF. Apparently, plan was for referral to gastroenterology, but it hadn't occurred prior to this admission. Patient reports that he's had a colonoscopy and upper endoscopy  but many years ago. Denied any black  stools. Patient was seen by [facility] gastroenterology. EGD was done which did not show any concerning lesions. Patient and family wants to hold off on colonoscopy. There have been no reports of bleeding. He was transfused 2 units of blood on 11/2. Hemoglobin remains stable. Repeat blood work at the SNF in a few days.  Prior history of coronary artery disease Apparently not amenable to intervention. Patient had been experiencing chest pain which could have been due to angina secondary to significant anemia. Troponins were not significantly elevated. Pain has resolved. Continue Ranexa. Echocardiogram as above. EKG did not show any ischemic changes.  History of moderate to severe aortic stenosis Stable. Echocardiogram as above.  History of COPD with probable mild exacerbation Much improved. Continue nebulizer treatments as needed. He was initially on intravenous steroids. This was tapered down. Currently on prednisone for 6 more days.   History of peripheral vascular disease Antiplatelet agent was held due to dropping hemoglobin. Continue to monitor closely. Okay to resume aspirin at the time of discharge. Monitor closely for bleeding.  History of essential hypertension Blood pressure was low at the time of admission. Subsequently stabilized.   History of type 2 diabetes with hyperglycemia Patient was on metformin at home. Noted to have high blood sugars in the hospital, which was most likely secondary to steroids. HbA1c 7.3. He was given Lantus during this hospitalization. As his steroid is tapered down blood sugar should return to more reasonable levels. He may resume metformin. Sliding scale coverage, at least till he is on steroids. No need for long-acting insulin at this time.   Superficial Vein Thrombosis Doppler revealed acute superficial vein thrombosis involving the right cephalic vein. Symptomatic treatment.  Patient is DO NOT RESUSCITATE.  Overall, stable. Much better. Still has a cough  which is most likely secondary to COPD. He was reassured. Okay for discharge to SNF.   PERTINENT LABS:  The results of significant diagnostics from this hospitalization (including imaging, microbiology, ancillary and laboratory) are listed below for reference.    Microbiology: Recent Results (from the past 240 hour(s))  Culture, blood (routine x 2)     Status: None   Collection Time: 06/30/15  6:31 PM  Result Value Ref Range Status   Specimen Description BLOOD LEFT ARM  Final   Special Requests BOTTLES DRAWN AEROBIC AND ANAEROBIC 3.5CC  Final   Culture NO GROWTH 5 DAYS  Final   Report Status 07/05/2015 FINAL  Final  Culture, blood (routine x 2)     Status: None   Collection Time: 06/30/15  8:16 PM  Result Value Ref Range Status   Specimen Description BLOOD LEFT HAND  Final   Special Requests BOTTLES DRAWN AEROBIC AND ANAEROBIC 5CC  Final   Culture NO GROWTH 5 DAYS  Final   Report Status 07/05/2015 FINAL  Final  Urine culture     Status: None   Collection Time: 06/30/15  8:21 PM  Result Value Ref Range Status   Specimen Description URINE, CLEAN CATCH  Final   Special Requests Normal  Final   Culture MULTIPLE SPECIES PRESENT, SUGGEST RECOLLECTION  Final   Report Status 07/01/2015 FINAL  Final  MRSA PCR Screening     Status: None   Collection Time: 07/01/15  1:23 AM  Result Value Ref Range Status   MRSA by PCR NEGATIVE NEGATIVE Final    Comment:        The GeneXpert MRSA Assay (FDA approved for NASAL  specimens only), is one component of a comprehensive MRSA colonization surveillance program. It is not intended to diagnose MRSA infection nor to guide or monitor treatment for MRSA infections.      Labs: Basic Metabolic Panel:  Recent Labs Lab 07/03/15 0434 07/04/15 0509 07/05/15 0346 07/06/15 0139 07/07/15 0325  NA 134* 135 134* 134* 135  K 4.0 3.5 3.4* 3.5 3.4*  CL 98* 96* 93* 94* 93*  CO2 26 30 31  33* 34*  GLUCOSE 251* 166* 165* 192* 152*  BUN 16 17 17 19 18    CREATININE 1.19 1.13 1.14 1.14 1.21  CALCIUM 8.6* 8.5* 8.9 9.1 9.3   Liver Function Tests:  Recent Labs Lab 07/01/15 1240  AST 17  ALT 14*  ALKPHOS 60  BILITOT 0.5  PROT 5.1*  ALBUMIN 2.1*   CBC:  Recent Labs Lab 07/01/15 1240 07/02/15 0227  07/03/15 0434 07/04/15 0509 07/04/15 1240 07/05/15 0346 07/06/15 0139  WBC 6.7 7.4  --  8.2 8.8 11.1* 8.7 8.7  NEUTROABS 6.1 5.8  --   --   --   --   --   --   HGB 7.1* 6.3*  < > 10.0* 8.8* 10.3* 9.4* 9.5*  HCT 21.1* 19.2*  < > 29.0* 25.9* 30.8* 27.7* 28.3*  MCV 85.1 85.0  --  84.1 84.6 84.6 84.5 84.2  PLT 183 206  --  208 209 246 226 244  < > = values in this interval not displayed. Cardiac Enzymes:  Recent Labs Lab 07/01/15 0111 07/01/15 0750 07/01/15 1240  TROPONINI 0.04* 0.04* 0.03   BNP: BNP (last 3 results)  Recent Labs  06/30/15 2233  BNP 793.7*    CBG:  Recent Labs Lab 07/05/15 2105 07/06/15 0555 07/06/15 1612 07/06/15 2104 07/07/15 0551  GLUCAP 318* 153* 330* 214* 135*     IMAGING STUDIES Dg Chest 1 View  06/30/2015  CLINICAL DATA:  Cough in crackles.  Recently diagnosed pneumonia. EXAM: CHEST 1 VIEW COMPARISON:  06/08/2013. FINDINGS: The cardiac silhouette remains borderline enlarged. Minimal linear opacity at the left lateral lung base. Otherwise, clear lungs with normal vascularity. No acute bony abnormality. IMPRESSION: 1. Small amount of probable scarring at the left lateral lung base. 2. Stable borderline cardiomegaly. Electronically Signed   By: Claudie Revering M.D.   On: 06/30/2015 19:08   US Renal  06/30/2015  CLINICAL DATA:  Acute kidney injury. EXAM: RENAL / URINARY TRACT ULTRASOUND COMPLETE COMPARISON:  Ultrasound dated 09/20/2010 FINDINGS: Right Kidney: Length: 12.7 cm. Three simple cysts with the largest being 4.5 cm, unchanged since the prior study. Slightly echogenic renal parenchyma. No solid mass or hydronephrosis visualized. Left Kidney: Length: 12.5 cm. Slightly echogenic renal  parenchyma. No mass or hydronephrosis. Bladder: Appears normal for degree of bladder distention. Slight prominence of the prostate gland. IMPRESSION: No hydronephrosis or solid mass lesions. Slight increased echogenicity of the renal parenchyma suggesting renal medical disease. Electronically Signed   By: Lorriane Shire M.D.   On: 06/30/2015 21:51   Dg Chest Port 1 View  07/02/2015  CLINICAL DATA:  81 year old male with history of dyspnea. EXAM: PORTABLE CHEST 1 VIEW COMPARISON:  Chest x-ray 06/30/2015. FINDINGS: There is marked cephalization of the pulmonary vasculature, severe indistinctness of the interstitial markings, and extensive multifocal airspace disease throughout the lungs bilaterally suggestive of severe pulmonary edema. Small bilateral pleural effusions. Mild cardiomegaly. Upper mediastinal contours are within normal limits. Atherosclerosis in the thoracic aorta. IMPRESSION: 1. Findings, as above, suggestive of congestive heart failure. 2. Atherosclerosis.  Electronically Signed   By: Vinnie Langton M.D.   On: 07/02/2015 16:24    DISCHARGE EXAMINATION: Filed Vitals:   07/06/15 2145 07/07/15 0553 07/07/15 0834 07/07/15 0917  BP:  156/66  122/53  Pulse: 82 68  83  Temp:  97.7 F (36.5 C)  98.1 F (36.7 C)  TempSrc:  Oral  Oral  Resp: 18 20    Height:      Weight:  70.852 kg (156 lb 3.2 oz)    SpO2:  99% 98% 99%   General appearance: alert, cooperative, appears stated age and no distress Resp: Improved aeration bilaterally. No wheezing. No rhonchi. few crackles at bases. Cardio: regular rate and rhythm, S1, S2 normal, no murmur, click, rub or gallop GI: soft, non-tender; bowel sounds normal; no masses,  no organomegaly Extremities: extremities normal, atraumatic, no cyanosis or edema Neurologic: No focal deficits  DISPOSITION: SNF  Discharge Instructions    Call MD for:  difficulty breathing, headache or visual disturbances    Complete by:  As directed      Call MD for:   extreme fatigue    Complete by:  As directed      Call MD for:  persistant dizziness or light-headedness    Complete by:  As directed      Call MD for:  persistant nausea and vomiting    Complete by:  As directed      Call MD for:  severe uncontrolled pain    Complete by:  As directed      Call MD for:  temperature >100.4    Complete by:  As directed      Diet Carb Modified    Complete by:  As directed      Discharge instructions    Complete by:  As directed   CBC and BMET in 3 days. Check CBG's three times daily till on steroids.  You were cared for by a hospitalist during your hospital stay. If you have any questions about your discharge medications or the care you received while you were in the hospital after you are discharged, you can call the unit and asked to speak with the hospitalist on call if the hospitalist that took care of you is not available. Once you are discharged, your primary care physician will handle any further medical issues. Please note that NO REFILLS for any discharge medications will be authorized once you are discharged, as it is imperative that you return to your primary care physician (or establish a relationship with a primary care physician if you do not have one) for your aftercare needs so that they can reassess your need for medications and monitor your lab values. If you do not have a primary care physician, you can call [phone] for a physician referral.     Increase activity slowly    Complete by:  As directed            ALLERGIES:  Allergies  Allergen Reactions  . Iodine Shortness Of Breath  . Iohexol      Code: SOB, Desc: ivp dye, iodine, Onset Date: [id number]      Current Discharge Medication List    START taking these medications   Details  benzonatate (TESSALON) 100 MG capsule Take 1 capsule (100 mg total) by mouth 3 (three) times daily as needed for cough. Qty: 20 capsule, Refills: 0    furosemide (LASIX) 40 MG tablet Take 1 tablet  (40 mg total) by mouth daily. Qty: 30  tablet    insulin aspart (NOVOLOG) 100 UNIT/ML injection 0-9 Units, Subcutaneous, 3 times daily with meals as below:  CBG < 70: implement hypoglycemia protocol CBG 70 - 120: 0 units CBG 121 - 150: 1 unit CBG 151 - 200: 2 units CBG 201 - 250: 3 units CBG 251 - 300: 5 units and call MD Qty: 10 mL, Refills: 11    polyethylene glycol (MIRALAX / GLYCOLAX) packet Take 17 g by mouth daily as needed for moderate constipation. Qty: 14 each, Refills: 0    potassium chloride 20 MEQ TBCR Take 20 mEq by mouth daily.    predniSONE (DELTASONE) 20 MG tablet Take 2 tablets once daily for 3 days, then take 1 tablet once daily for 3 days, then STOP      CONTINUE these medications which have NOT CHANGED   Details  acetaminophen (TYLENOL) 500 MG tablet Take 500 mg by mouth every 6 (six) hours as needed for pain.    albuterol (PROVENTIL) (5 MG/ML) 0.5% nebulizer solution Take 0.5 mLs (2.5 mg total) by nebulization every 6 (six) hours as needed for wheezing or shortness of breath. Qty: 20 mL, Refills: 12    aspirin 81 MG tablet Take 81 mg by mouth daily.      atorvastatin (LIPITOR) 80 MG tablet Take 80 mg by mouth at bedtime.    gabapentin (NEURONTIN) 300 MG capsule Take 300 mg by mouth at bedtime.      ipratropium (ATROVENT) 0.02 % nebulizer solution Take 2.5 mLs (0.5 mg total) by nebulization every 6 (six) hours as needed. Qty: 75 mL, Refills: 12    metFORMIN (GLUCOPHAGE) 500 MG tablet Take 500 mg by mouth 2 (two) times daily with a meal.    metoprolol tartrate (LOPRESSOR) 25 MG tablet Take 12.5 mg by mouth 2 (two) times daily.    mometasone (ASMANEX) 220 MCG/INH inhaler Inhale 2 puffs into the lungs at bedtime.     nitroGLYCERIN (NITROSTAT) 0.4 MG SL tablet Place 0.4 mg under the tongue every 5 (five) minutes as needed for chest pain.    Olodaterol HCl 2.5 MCG/ACT AERS Inhale 2.5 mcg into the lungs daily.    omeprazole (PRILOSEC) 40 MG capsule Take 1  capsule (40 mg total) by mouth 2 (two) times daily before a meal.    polyvinyl alcohol (LIQUIFILM TEARS) 1.4 % ophthalmic solution Place 1 drop into both eyes as needed. Qty: 15 mL    ranolazine (RANEXA) 500 MG 12 hr tablet Take 500 mg by mouth 2 (two) times daily.      Travoprost, BAK Free, (TRAVATAN) 0.004 % SOLN ophthalmic solution Place 1 drop into both eyes at bedtime.        STOP taking these medications     albuterol (PROVENTIL HFA;VENTOLIN HFA) 108 (90 BASE) MCG/ACT inhaler        Follow-up Information    Follow up with Your Providers at the [place]. Schedule an appointment as soon as possible for a visit in 2 weeks.   Why:  post hospitalization follow up      Please follow up.   Why:  MD @ SNF WILL FOLLOW      TOTAL DISCHARGE TIME: 35 mins  [facility] Pager [phone]  07/07/2015, 10:40 AM

## 2015-07-07 NOTE — Care Management Important Message (Signed)
Important Message  Patient Details  Name: Eugene Bauer MRN: [id number] Date of Birth: Jul 03, 1927   Medicare Important Message Given:  Yes-third notification given    Loann Quill 07/07/2015, 1:34 PM

## 2015-07-07 NOTE — Progress Notes (Signed)
Inpatient Diabetes Program Recommendations  AACE/ADA: New Consensus Statement on Inpatient Glycemic Control (2015)  Target Ranges:  Prepandial:   less than 140 mg/dL      Peak postprandial:   less than 180 mg/dL (1-2 hours)      Critically ill patients:  140 - 180 mg/dL  Results for BERNAL, LUHMAN (MRN [id number]) as of 07/07/2015 10:44  Ref. Range 07/06/2015 05:55 07/06/2015 16:12 07/06/2015 21:04 07/07/2015 05:51  Glucose-Capillary Latest Ref Range: 65-99 mg/dL 153 (H) 330 (H) 214 (H) 135 (H)   Review of Glycemic Control  Current orders for Inpatient glycemic control: Lantus 5 units daily, Novolog 0-9 units TID with meals  Inpatient Diabetes Program Recommendations: Insulin - Meal Coverage: Post prandial glucose is consistently elevated. While inpatient and ordered steorids (currently ordered Prednisone 40 mg QAM), please consider ordering Novolog 3 units TID with meals for meal coverage.  Thanks, Barnie Alderman, RN, MSN, CDE Diabetes Coordinator Inpatient Diabetes Program [phone] (Team Pager from [place] to [place]) [phone] ([facility] office) [phone] [facility] office) [phone] [facility] office)

## 2015-07-07 NOTE — Discharge Instructions (Signed)

## 2015-07-07 NOTE — Progress Notes (Addendum)
Physical Therapy Treatment Patient Details Name: Eugene Bauer MRN: [id number] DOB: [birth year] Today's Date: 07/07/2015    History of Present Illness Eugene Bauer is a 81 y.o. male with PMH of hyperlipidemia, diabetes mellitus, COPD, GERD, CAD, s/p of stent placement, diastolic congestive heart failure, gastric ulcer, DJD, hepatitis, chronic back pain, right retinal embolus, stroke, prostate cancer (post status of radiation therapy), left nose basal cell carcinoma, DVT, bilateral leg stent placement for possible PAD, recently treated pneumonia, anemia, who presents with generalized weakness, cough and chest pain.    PT Comments    Pt admitted with above diagnosis. Pt currently with functional limitations due to balance and endurance deficits. Pt ambulated well with 4 wheeled RW without LOB.  Still needed safety cues and does fatigue and have to have sitting rest breaks.   Pt will benefit from skilled PT to increase their independence and safety mobility to allow discharge to the venue listed below.    Follow Up Recommendations  SNF;Supervision/Assistance - 24 hour     Equipment Recommendations  None recommended by PT    Recommendations for Other Services       Precautions / Restrictions Precautions Precautions: Fall Restrictions Weight Bearing Restrictions: No    Mobility  Bed Mobility Overal bed mobility: Needs Assistance Bed Mobility: Supine to Sit     Supine to sit: Supervision     General bed mobility comments: in chair on arrival  Transfers Overall transfer level: Needs assistance Equipment used: Rolling walker (2 wheeled) Transfers: Sit to/from Stand Sit to Stand: Supervision         General transfer comment: No assist needed to perform sit to stand.  Pt locks and unlocks brakes on 4 wheeled RW without assist.    Ambulation/Gait Ambulation/Gait assistance: Supervision Ambulation Distance (Feet): 500 Feet (250 feet x2) Assistive device: 4-wheeled  walker Gait Pattern/deviations: Step-through pattern;Decreased stride length;Wide base of support   Gait velocity interpretation: <1.8 ft/sec, indicative of risk for recurrent falls General Gait Details: Pt safe with 4 wheeled RW.  Maneuvers it well.  Able to sit and rest when needed.  Took 2 sitting rest breaks.  Sats 92% -96% with 3LO2 in place for ambulation. Cues to stay close to RW.   Stairs            Wheelchair Mobility    Modified Rankin (Stroke Patients Only)       Balance Overall balance assessment: Needs assistance Sitting-balance support: No upper extremity supported;Feet supported Sitting balance-Leahy Scale: Fair     Standing balance support: Bilateral upper extremity supported;During functional activity Standing balance-Leahy Scale: Poor Standing balance comment: Needed assist of UE support on RW.                    Cognition Arousal/Alertness: Awake/alert Behavior During Therapy: WFL for tasks assessed/performed Overall Cognitive Status: Within Functional Limits for tasks assessed                      Exercises  LAQ x 10 reps, hip flexion in sitting 10 reps, ankle pumps x 10 reps    General Comments        Pertinent Vitals/Pain Pain Assessment: No/denies pain Faces Pain Scale: Hurts little more Pain Location: "Everyone keeps asking how I'm feeling. I don't know how I'm feeling."  VSS    Home Living                      Prior Function  PT Goals (current goals can now be found in the care plan section) Acute Rehab PT Goals Patient Stated Goal: to get better Progress towards PT goals: Progressing toward goals    Frequency  Min 2X/week    PT Plan Current plan remains appropriate    Co-evaluation             End of Session Equipment Utilized During Treatment: Gait belt;Oxygen Activity Tolerance: Patient limited by fatigue Patient left: in chair;with call bell/phone within reach;with chair alarm  set;with family/visitor present     Time: 9675-9163 PT Time Calculation (min) (ACUTE ONLY): 24 min  Charges:  $Gait Training: 8-22 mins $Therapeutic Exercise: 8-22 mins                    G Codes:      Eugene Bauer, Arrie Aran F 2015/07/11, 2:52 PM [facility] [facility] [phone] (pager)

## 2015-07-07 NOTE — Progress Notes (Signed)
Report called to Ann at [facility].   

## 2015-07-07 NOTE — Progress Notes (Signed)
Occupational Therapy Treatment Patient Details Name: Eugene Bauer MRN: [id number] DOB: Nov 03, 1926 Today's Date: 07/07/2015    History of present illness Eugene Bauer is a 81 y.o. male with PMH of hyperlipidemia, diabetes mellitus, COPD, GERD, CAD, s/p of stent placement, diastolic congestive heart failure, gastric ulcer, DJD, hepatitis, chronic back pain, right retinal embolus, stroke, prostate cancer (post status of radiation therapy), left nose basal cell carcinoma, DVT, bilateral leg stent placement for possible PAD, recently treated pneumonia, anemia, who presents with generalized weakness, cough and chest pain.   OT comments  Pt progressing towards acute OT goals. Focus of session was activity tolerance and dynamic standing balance during UB/LB dressing. Pt min guard to min A for dressing. D/c plan updated to SNF at d/c for further rehab.   Follow Up Recommendations  SNF;Supervision/Assistance - 24 hour    Equipment Recommendations  None recommended by OT    Recommendations for Other Services      Precautions / Restrictions Precautions Precautions: Fall Restrictions Weight Bearing Restrictions: No       Mobility Bed Mobility               General bed mobility comments: in chair on arrival  Transfers Overall transfer level: Needs assistance Equipment used: Rolling walker (2 wheeled) Transfers: Sit to/from Stand Sit to Stand: Min guard         General transfer comment: min guard in static sit<>stand. Fatigues.    Balance Overall balance assessment: Needs assistance Sitting-balance support: No upper extremity supported;Feet supported Sitting balance-Leahy Scale: Fair     Standing balance support: Bilateral upper extremity supported;No upper extremity supported;During functional activity Standing balance-Leahy Scale: Poor Standing balance comment: external support for baalnce during LB dressing                   ADL Overall ADL's : Needs  assistance/impaired Eating/Feeding: Set up;Sitting               Upper Body Dressing : Sitting;Minimal assistance Upper Body Dressing Details (indicate cue type and reason): assist for buttons and positioning 2nd arm donning Lower Body Dressing: Minimal assistance;Sit to/from stand;Min guard Lower Body Dressing Details (indicate cue type and reason): assist for balance. Pt able to cross foot onto opposite knee to don socks               General ADL Comments: Pt completed UB/LB dressing in sit<>stand as detailed above. Pt on 2L oxygen.       Vision                     Perception     Praxis      Cognition   Behavior During Therapy: WFL for tasks assessed/performed Overall Cognitive Status: Within Functional Limits for tasks assessed                       Extremity/Trunk Assessment               Exercises     Shoulder Instructions       General Comments      Pertinent Vitals/ Pain       Pain Assessment: Faces Faces Pain Scale: Hurts little more Pain Location: "Everyone keeps asking how I'm feeling. I don't know how I'm feeling."  Home Living  Prior Functioning/Environment              Frequency Min 2X/week     Progress Toward Goals  OT Goals(current goals can now be found in the care plan section)  Progress towards OT goals: Progressing toward goals  Acute Rehab OT Goals Patient Stated Goal: to get better OT Goal Formulation: With patient Time For Goal Achievement: 07/08/15 Potential to Achieve Goals: Good ADL Goals Pt Will Perform Grooming: with modified independence;standing Pt Will Perform Lower Body Bathing: with modified independence;sit to/from stand Pt Will Perform Lower Body Dressing: with modified independence;sit to/from stand Pt Will Transfer to Toilet: with modified independence;ambulating Pt Will Perform Toileting - Clothing Manipulation and  hygiene: with modified independence;sit to/from stand Pt Will Perform Tub/Shower Transfer: Shower transfer;ambulating;shower seat;grab bars  Plan Discharge plan needs to be updated    Co-evaluation                 End of Session Equipment Utilized During Treatment: Gait belt;Oxygen   Activity Tolerance Patient tolerated treatment well   Patient Left in chair;with call bell/phone within reach;with chair alarm set   Nurse Communication          Time: [id number] OT Time Calculation (min): 32 min  Charges: OT General Charges $OT Visit: 1 Procedure OT Treatments $Self Care/Home Management : 23-37 mins  Hortencia Pilar 07/07/2015, 1:50 PM

## 2015-07-07 NOTE — Clinical Social Work Placement (Signed)
   CLINICAL SOCIAL WORK PLACEMENT  NOTE  Date:  07/07/2015  Patient Details  Name: Eugene Bauer MRN: [id number] Date of Birth: 1927/05/25  Clinical Social Work is seeking post-discharge placement for this patient at the [place] level of care (*CSW will initial, date and re-position this form in  chart as items are completed):  No (Increase level of care within [facility])   Patient/family provided with [place] Work Department's list of facilities offering this level of care within the geographic area requested by the patient (or if unable, by the patient's family).  No   Patient/family informed of their freedom to choose among providers that offer the needed level of care, that participate in Medicare, Medicaid or managed care program needed by the patient, have an available bed and are willing to accept the patient.  No   Patient/family informed of [facility]'s ownership interest in [facility] and [facility], as well as of the fact that they are under no obligation to receive care at these facilities.  PASRR submitted to EDS on       PASRR number received on       Existing PASRR number confirmed on       FL2 transmitted to all facilities in geographic area requested by pt/family on 07/03/15     FL2 transmitted to all facilities within larger geographic area on       Patient informed that his/her managed care company has contracts with or will negotiate with certain facilities, including the following:        Yes   Patient/family informed of bed offers received.  Patient chooses bed at [facility]     Physician recommends and patient chooses bed at      Patient to be transferred to [facility] on 07/07/15.  Patient to be transferred to facility by Ambulance  Corey Harold)     Patient family notified on 07/07/15 of transfer.  Name of family member notified:  Daughter:  Wilmon Pali     PHYSICIAN Please prepare priority discharge  summary, including medications, Please sign FL2, Please prepare prescriptions     Additional Comment: Patient d/c'd to SNF today per ok of MD for rehab.  Bed is available per Claiborne Billings- Admissions at [facility] and will accept patient today for rehab. Daughter and patient are pleased with d/c plan.  EMS notified and d/c packet completed; d/c summary sent to facility for review and nursing notified to call report to facility.  No further CSW needs identified.  CSW signing off.  Lorie Phenix. Murrell Redden [phone]      _______________________________________________ Kendell Bane T, LCSW 07/07/2015 2:00 PM

## 2015-07-17 ENCOUNTER — Encounter: Payer: Self-pay | Admitting: Internal Medicine

## 2016-01-26 ENCOUNTER — Emergency Department (HOSPITAL_COMMUNITY)
Admission: EM | Admit: 2016-01-26 | Discharge: 2016-01-26 | Disposition: A | Discharge status: Home / Self Care | Payer: Medicare Other | Attending: Emergency Medicine | Admitting: Emergency Medicine

## 2016-01-26 ENCOUNTER — Encounter (HOSPITAL_COMMUNITY): Payer: Self-pay | Admitting: Emergency Medicine

## 2016-01-26 DIAGNOSIS — Z79899 Other long term (current) drug therapy: Secondary | ICD-10-CM | POA: Insufficient documentation

## 2016-01-26 DIAGNOSIS — Z7984 Long term (current) use of oral hypoglycemic drugs: Secondary | ICD-10-CM | POA: Insufficient documentation

## 2016-01-26 DIAGNOSIS — Z9861 Coronary angioplasty status: Secondary | ICD-10-CM | POA: Insufficient documentation

## 2016-01-26 DIAGNOSIS — Z85828 Personal history of other malignant neoplasm of skin: Secondary | ICD-10-CM | POA: Insufficient documentation

## 2016-01-26 DIAGNOSIS — M1990 Unspecified osteoarthritis, unspecified site: Secondary | ICD-10-CM | POA: Insufficient documentation

## 2016-01-26 DIAGNOSIS — Z9981 Dependence on supplemental oxygen: Secondary | ICD-10-CM | POA: Diagnosis not present

## 2016-01-26 DIAGNOSIS — E119 Type 2 diabetes mellitus without complications: Secondary | ICD-10-CM | POA: Insufficient documentation

## 2016-01-26 DIAGNOSIS — Z794 Long term (current) use of insulin: Secondary | ICD-10-CM | POA: Diagnosis not present

## 2016-01-26 DIAGNOSIS — Z8546 Personal history of malignant neoplasm of prostate: Secondary | ICD-10-CM | POA: Insufficient documentation

## 2016-01-26 DIAGNOSIS — R011 Cardiac murmur, unspecified: Secondary | ICD-10-CM | POA: Insufficient documentation

## 2016-01-26 DIAGNOSIS — Z9889 Other specified postprocedural states: Secondary | ICD-10-CM | POA: Insufficient documentation

## 2016-01-26 DIAGNOSIS — R339 Retention of urine, unspecified: Secondary | ICD-10-CM | POA: Diagnosis not present

## 2016-01-26 DIAGNOSIS — K219 Gastro-esophageal reflux disease without esophagitis: Secondary | ICD-10-CM | POA: Insufficient documentation

## 2016-01-26 DIAGNOSIS — Z7982 Long term (current) use of aspirin: Secondary | ICD-10-CM | POA: Insufficient documentation

## 2016-01-26 DIAGNOSIS — I25119 Atherosclerotic heart disease of native coronary artery with unspecified angina pectoris: Secondary | ICD-10-CM | POA: Diagnosis not present

## 2016-01-26 DIAGNOSIS — Z8673 Personal history of transient ischemic attack (TIA), and cerebral infarction without residual deficits: Secondary | ICD-10-CM | POA: Diagnosis not present

## 2016-01-26 DIAGNOSIS — G8929 Other chronic pain: Secondary | ICD-10-CM | POA: Insufficient documentation

## 2016-01-26 DIAGNOSIS — Z862 Personal history of diseases of the blood and blood-forming organs and certain disorders involving the immune mechanism: Secondary | ICD-10-CM | POA: Insufficient documentation

## 2016-01-26 DIAGNOSIS — I252 Old myocardial infarction: Secondary | ICD-10-CM | POA: Diagnosis not present

## 2016-01-26 DIAGNOSIS — R319 Hematuria, unspecified: Secondary | ICD-10-CM

## 2016-01-26 DIAGNOSIS — I10 Essential (primary) hypertension: Secondary | ICD-10-CM | POA: Insufficient documentation

## 2016-01-26 DIAGNOSIS — E7800 Pure hypercholesterolemia, unspecified: Secondary | ICD-10-CM | POA: Insufficient documentation

## 2016-01-26 DIAGNOSIS — J439 Emphysema, unspecified: Secondary | ICD-10-CM | POA: Diagnosis not present

## 2016-01-26 LAB — URINALYSIS, ROUTINE W REFLEX MICROSCOPIC
BILIRUBIN URINE: NEGATIVE
GLUCOSE, UA: NEGATIVE mg/dL
Ketones, ur: NEGATIVE mg/dL
Nitrite: NEGATIVE
PH: 5.5 (ref 5.0–8.0)
Protein, ur: 100 mg/dL — AB
SPECIFIC GRAVITY, URINE: 1.012 (ref 1.005–1.030)

## 2016-01-26 LAB — BASIC METABOLIC PANEL
ANION GAP: 7 (ref 5–15)
BUN: 25 mg/dL — AB (ref 6–20)
CALCIUM: 10.3 mg/dL (ref 8.9–10.3)
CO2: 27 mmol/L (ref 22–32)
Chloride: 101 mmol/L (ref 101–111)
Creatinine, Ser: 1.55 mg/dL — ABNORMAL HIGH (ref 0.61–1.24)
GFR calc Af Amer: 45 mL/min — ABNORMAL LOW (ref >60)
GFR, EST NON AFRICAN AMERICAN: 39 mL/min — AB (ref >60)
GLUCOSE: 180 mg/dL — AB (ref 65–99)
Potassium: 4.7 mmol/L (ref 3.5–5.1)
Sodium: 135 mmol/L (ref 135–145)

## 2016-01-26 LAB — URINE MICROSCOPIC-ADD ON: Squamous Epithelial / LPF: NONE SEEN

## 2016-01-26 LAB — CBC
HEMATOCRIT: 28.4 % — AB (ref 39.0–52.0)
Hemoglobin: 9 g/dL — ABNORMAL LOW (ref 13.0–17.0)
MCH: 28.4 pg (ref 26.0–34.0)
MCHC: 31.7 g/dL (ref 30.0–36.0)
MCV: 89.6 fL (ref 78.0–100.0)
PLATELETS: 214 10*3/uL (ref 150–400)
RBC: 3.17 MIL/uL — ABNORMAL LOW (ref 4.22–5.81)
RDW: 15.2 % (ref 11.5–15.5)
WBC: 8.2 10*3/uL (ref 4.0–10.5)

## 2016-01-26 MED ORDER — SULFAMETHOXAZOLE-TRIMETHOPRIM 800-160 MG PO TABS: 1.0000 | ORAL_TABLET | Freq: Two times a day (BID) | ORAL | Status: AC

## 2016-01-26 MED ORDER — SULFAMETHOXAZOLE-TRIMETHOPRIM 800-160 MG PO TABS: 1.0000 | ORAL_TABLET | Freq: Once | ORAL | Status: DC

## 2016-01-26 NOTE — ED Notes (Signed)
Pt foley switched to leg bag. Pt and family educated on use.

## 2016-01-26 NOTE — Discharge Instructions (Signed)
Sometimes bloody urine can indicate infection, or small bladder tumor. It is important to call urology for follow-up appointment. The catheter will need to be removed in 5-7 days to allow the stretch injury of your bladder to heal.   Acute Urinary Retention, Male Acute urinary retention is the temporary inability to urinate. This is a common problem in older men. As men age their prostates become larger and block the flow of urine from the bladder. This is usually a problem that has come on gradually.  HOME CARE INSTRUCTIONS If you are sent home with a Foley catheter and a drainage system, you will need to discuss the best course of action with your health care provider. While the catheter is in, maintain a good intake of fluids. Keep the drainage bag emptied and lower than your catheter. This is so that contaminated urine will not flow back into your bladder, which could lead to a urinary tract infection. There are two main types of drainage bags. One is a large bag that usually is used at night. It has a good capacity that will allow you to sleep through the night without having to empty it. The second type is called a leg bag. It has a smaller capacity, so it needs to be emptied more frequently. However, the main advantage is that it can be attached by a leg strap and can go underneath your clothing, allowing you the freedom to move about or leave your home. Only take over-the-counter or prescription medicines for pain, discomfort, or fever as directed by your health care provider.  SEEK MEDICAL CARE IF:  You develop a low-grade fever.  You experience spasms or leakage of urine with the spasms. SEEK IMMEDIATE MEDICAL CARE IF:   You develop chills or fever.  Your catheter stops draining urine.  Your catheter falls out.  You start to develop increased bleeding that does not respond to rest and increased fluid intake. MAKE SURE YOU:  Understand these instructions.  Will watch your  condition.  Will get help right away if you are not doing well or get worse.   This information is not intended to replace advice given to you by your health care provider. Make sure you discuss any questions you have with your health care provider.   Document Released: 11/22/2000 Document Revised: 12/31/2014 Document Reviewed: 01/25/2013 Elsevier Interactive Patient Education 2016 Elsevier Inc.  Hematuria, Adult Hematuria is blood in your urine. It can be caused by a bladder infection, kidney infection, prostate infection, kidney stone, or cancer of your urinary tract. Infections can usually be treated with medicine, and a kidney stone usually will pass through your urine. If neither of these is the cause of your hematuria, further workup to find out the reason may be needed. It is very important that you tell your health care provider about any blood you see in your urine, even if the blood stops without treatment or happens without causing pain. Blood in your urine that happens and then stops and then happens again can be a symptom of a very serious condition. Also, pain is not a symptom in the initial stages of many urinary cancers. HOME CARE INSTRUCTIONS   Drink lots of fluid, 3-4 quarts a day. If you have been diagnosed with an infection, cranberry juice is especially recommended, in addition to large amounts of water.  Avoid caffeine, tea, and carbonated beverages because they tend to irritate the bladder.  Avoid alcohol because it may irritate the prostate.  Take  all medicines as directed by your health care provider.  If you were prescribed an antibiotic medicine, finish it all even if you start to feel better.  If you have been diagnosed with a kidney stone, follow your health care provider's instructions regarding straining your urine to catch the stone.  Empty your bladder often. Avoid holding urine for long periods of time.  After a bowel movement, women should cleanse front  to back. Use each tissue only once.  Empty your bladder before and after sexual intercourse if you are a male. SEEK MEDICAL CARE IF:  You develop back pain.  You have a fever.  You have a feeling of sickness in your stomach (nausea) or vomiting.  Your symptoms are not better in 3 days. Return sooner if you are getting worse. SEEK IMMEDIATE MEDICAL CARE IF:   You develop severe vomiting and are unable to keep the medicine down.  You develop severe back or abdominal pain despite taking your medicines.  You begin passing a large amount of blood or clots in your urine.  You feel extremely weak or faint, or you pass out. MAKE SURE YOU:   Understand these instructions.  Will watch your condition.  Will get help right away if you are not doing well or get worse.   This information is not intended to replace advice given to you by your health care provider. Make sure you discuss any questions you have with your health care provider.   Document Released: 08/16/2005 Document Revised: 09/06/2014 Document Reviewed: 04/16/2013 Elsevier Interactive Patient Education [employer].

## 2016-01-26 NOTE — ED Notes (Addendum)
Pt goes to [place] and has been monitoring his BUN and Creatinine. Pt has had blood in urine since, blood clots noted today. Pt also reports decrease urine output and is on a diuretic.

## 2016-01-26 NOTE — ED Provider Notes (Signed)
CSN: [id number]     Arrival date & time 01/26/16  1039 History   First MD Initiated Contact with Patient 01/26/16 1146     Chief Complaint  Patient presents with  . Hematuria      HPI  Patient presents evaluation of decreased sure with lower abdominal discomfort. His been able to urinate since this morning and has noticed some blood and clots in his urine. This take a diuretic. Had a "mild elevation of his BUN and creatinine" states this is being followed at [facility] clinic. No nausea or vomiting. Normal bowel habits.  Past Medical History  Diagnosis Date  . Aortic stenosis   . High cholesterol   . Retinal artery occlusion   . GERD (gastroesophageal reflux disease)   . Coronary artery disease   . Heart murmur   . On home oxygen therapy     "2L all the time" (01/02/2013)  . Angina   . Hypertension   . CHF (congestive heart failure) ([place])   . MI (myocardial infarction) ([place])     "I've had several; last one was 03/09/2012" (01/02/2013  . Asthma   . Pneumonia     "today; have had it at least twice before" (01/02/2013)  . Shortness of breath     "most of the time recently" (01/02/2013)  . Chronic bronchitis ([place])   . History of stomach ulcers     "have them now; they flare up when I eat the wrong thing or don't eat" (01/02/2013)  . Hepatitis     "don't know which one" (01/02/2013)  . [id number])     "probably weekly" (01/02/2013)  . Arthritis     "all over" (01/02/2013)  . DJD (degenerative joint disease)     "all over" (01/02/2013)  . Chronic lower back pain   . Urinary urgency   . Anemia     chronic/notes 01/02/2013  . Retinal embolus, right eye 2010  . Stroke [facility])     "several; left ankle/foot still weak from one of them" (01/02/2013), 2006  . COPD (chronic obstructive pulmonary disease) ([place])   . Emphysema   . Type II diabetes mellitus ([place])     Controlled by Metformin  . Prostate cancer [facility])     "had radiation treatments" (01/02/2013)  . Basal cell carcinoma     "left nose, behind  right knee" (01/02/2013)   Past Surgical History  Procedure Laterality Date  . Shoulder arthroscopy w/ rotator cuff repair Right 1990's; 2000's    "they've operated on it twice" (01/02/2013)  . Sp pta peripheral      Bilateral leg stents  . Inguinal hernia repair Bilateral     "1 wk apart" (01/02/2013)  . Coronary stent placement      "I've had 7 or 8 stents put in my heart" (01/02/2013)  . Cardiac catheterization      "couldn't get new type of stents put in" (01/02/2013)  . Coronary angioplasty with stent placement      "I've had 7 or 8 stents put in my heart; last stents put in ~ 2 wk ago" (01/02/2013)  . Back surgery    . Anterior fusion cervical spine      "C5-7; took bone off my right hip" (5-01/2013)  . Esophagogastroduodenoscopy (egd) with propofol N/A 07/03/2015    Procedure: ESOPHAGOGASTRODUODENOSCOPY (EGD) WITH PROPOFOL;  Surgeon: Jerene Bears, MD;  Location: [facility] ENDOSCOPY;  Service: Endoscopy;  Laterality: N/A;   Family History  Problem Relation Age of Onset  .  Coronary artery disease Brother 44  . Coronary artery disease Sister    Social History  Substance Use Topics  . Smoking status: Never Smoker   . Smokeless tobacco: Former User    Types: Chew     Comment: 01/02/2013 "chewed in high school playing ball"  . Alcohol Use: No    Review of Systems  Constitutional: Negative for fever, chills, diaphoresis, appetite change and fatigue.  HENT: Negative for mouth sores, sore throat and trouble swallowing.   Eyes: Negative for visual disturbance.  Respiratory: Negative for cough, chest tightness, shortness of breath and wheezing.   Cardiovascular: Negative for chest pain.  Gastrointestinal: Negative for nausea, vomiting, abdominal pain, diarrhea and abdominal distention.  Endocrine: Negative for polydipsia, polyphagia and polyuria.  Genitourinary: Positive for hematuria and decreased urine volume. Negative for dysuria and frequency.  Musculoskeletal: Negative for gait problem.    Skin: Negative for color change, pallor and rash.  Neurological: Negative for dizziness, syncope, light-headedness and headaches.  Hematological: Does not bruise/bleed easily.  Psychiatric/Behavioral: Negative for behavioral problems and confusion.      Allergies  Iodine and Iohexol  Home Medications   Prior to Admission medications   Medication Sig Start Date End Date Taking? Authorizing Provider  acetaminophen (TYLENOL) 500 MG tablet Take 500 mg by mouth every 6 (six) hours as needed for pain.    Historical Provider, MD  albuterol (PROVENTIL) (5 MG/ML) 0.5% nebulizer solution Take 0.5 mLs (2.5 mg total) by nebulization every 6 (six) hours as needed for wheezing or shortness of breath. 05/21/13   Ejiroghene Arlyce Dice, MD  aspirin 81 MG tablet Take 81 mg by mouth daily.      Historical Provider, MD  atorvastatin (LIPITOR) 80 MG tablet Take 80 mg by mouth at bedtime.    Historical Provider, MD  benzonatate (TESSALON) 100 MG capsule Take 1 capsule (100 mg total) by mouth 3 (three) times daily as needed for cough. 07/07/15   Bonnielee Haff, MD  furosemide (LASIX) 40 MG tablet Take 1 tablet (40 mg total) by mouth daily. 07/07/15   Bonnielee Haff, MD  gabapentin (NEURONTIN) 300 MG capsule Take 300 mg by mouth at bedtime.      Historical Provider, MD  insulin aspart (NOVOLOG) 100 UNIT/ML injection 0-9 Units, Subcutaneous, 3 times daily with meals as below:  CBG < 70: implement hypoglycemia protocol CBG 70 - 120: 0 units CBG 121 - 150: 1 unit CBG 151 - 200: 2 units CBG 201 - 250: 3 units CBG 251 - 300: 5 units and call MD 07/07/15   Bonnielee Haff, MD  ipratropium (ATROVENT) 0.02 % nebulizer solution Take 2.5 mLs (0.5 mg total) by nebulization every 6 (six) hours as needed. 05/21/13   Ejiroghene Arlyce Dice, MD  metFORMIN (GLUCOPHAGE) 500 MG tablet Take 500 mg by mouth 2 (two) times daily with a meal.    Historical Provider, MD  metoprolol tartrate (LOPRESSOR) 25 MG tablet Take 12.5 mg by mouth 2  (two) times daily.    Historical Provider, MD  mometasone [facility]) 220 MCG/INH inhaler Inhale 2 puffs into the lungs at bedtime.     Historical Provider, MD  nitroGLYCERIN (NITROSTAT) 0.4 MG SL tablet Place 0.4 mg under the tongue every 5 (five) minutes as needed for chest pain.    Historical Provider, MD  Olodaterol HCl 2.5 MCG/ACT AERS Inhale 2.5 mcg into the lungs daily.    Historical Provider, MD  omeprazole (PRILOSEC) 40 MG capsule Take 1 capsule (40 mg total) by  mouth 2 (two) times daily before a meal. 01/05/13   Shanker Kristeen Mans, MD  polyethylene glycol (MIRALAX / GLYCOLAX) packet Take 17 g by mouth daily as needed for moderate constipation. 07/07/15   Bonnielee Haff, MD  polyvinyl alcohol (LIQUIFILM TEARS) 1.4 % ophthalmic solution Place 1 drop into both eyes as needed. Patient taking differently: Place 1 drop into both eyes as needed for dry eyes.  01/05/13   Shanker Kristeen Mans, MD  potassium chloride 20 MEQ TBCR Take 20 mEq by mouth daily. 07/07/15   Bonnielee Haff, MD  predniSONE (DELTASONE) 20 MG tablet Take 2 tablets once daily for 3 days, then take 1 tablet once daily for 3 days, then STOP 07/07/15   Bonnielee Haff, MD  ranolazine (RANEXA) 500 MG 12 hr tablet Take 500 mg by mouth 2 (two) times daily.      Historical Provider, MD  sulfamethoxazole-trimethoprim (BACTRIM DS,SEPTRA DS) 800-160 MG tablet Take 1 tablet by mouth 2 (two) times daily. 01/26/16 02/02/16  Tanna Furry, MD  Travoprost, BAK Free, (TRAVATAN) 0.004 % SOLN ophthalmic solution Place 1 drop into both eyes at bedtime.      Historical Provider, MD   BP 111/55 mmHg  Pulse 86  Temp(Src) 98.6 F (37 C) (Oral)  Resp 18  SpO2 96% Physical Exam  Constitutional: He is oriented to person, place, and time. He appears well-developed and well-nourished. No distress.  HENT:  Head: Normocephalic.  Eyes: Conjunctivae are normal. Pupils are equal, round, and reactive to light. No scleral icterus.  Neck: Normal range of motion. Neck supple.  No thyromegaly present.  Cardiovascular: Normal rate and regular rhythm.  Exam reveals no gallop and no friction rub.   No murmur heard. Pulmonary/Chest: Effort normal and breath sounds normal. No respiratory distress. He has no wheezes. He has no rales.  Abdominal: Soft. Bowel sounds are normal. He exhibits no distension. There is no tenderness. There is no rebound.    Musculoskeletal: Normal range of motion.  Neurological: He is alert and oriented to person, place, and time.  Skin: Skin is warm and dry. No rash noted.  Psychiatric: He has a normal mood and affect. His behavior is normal.    ED Course  Procedures (including critical care time) Labs Review Labs Reviewed  URINALYSIS, ROUTINE W REFLEX MICROSCOPIC (NOT AT [facility]) - Abnormal; Notable for the following:    Color, Urine RED (*)    APPearance CLOUDY (*)    Hgb urine dipstick LARGE (*)    Protein, ur 100 (*)    Leukocytes, UA MODERATE (*)    All other components within normal limits  CBC - Abnormal; Notable for the following:    RBC 3.17 (*)    Hemoglobin 9.0 (*)    HCT 28.4 (*)    All other components within normal limits  BASIC METABOLIC PANEL - Abnormal; Notable for the following:    Glucose, Bld 180 (*)    BUN 25 (*)    Creatinine, Ser 1.55 (*)    GFR calc non Af Amer 39 (*)    GFR calc Af Amer 45 (*)    All other components within normal limits  URINE MICROSCOPIC-ADD ON - Abnormal; Notable for the following:    Bacteria, UA RARE (*)    All other components within normal limits  URINE CULTURE    Imaging Review No results found. I have personally reviewed and evaluated these images and lab results as part of my medical decision-making.   EKG Interpretation  None      MDM   Final diagnoses:  Hematuria  Urinary retention    Leg bag.  DC Follow up    Tanna Furry, MD 01/31/16 2100

## 2016-01-26 NOTE — ED Notes (Signed)
Bladder Scan- >517

## 2016-02-28 DEATH — deceased
